# Patient Record
Sex: Male | Born: 1965 | Race: White | Hispanic: No | Marital: Married | Smoking: Former smoker
Health system: Southern US, Community
[De-identification: ages and names within clinical notes are randomized; demographics above are authoritative.]

## PROBLEM LIST (undated history)

## (undated) DIAGNOSIS — J45990 Exercise induced bronchospasm: Secondary | ICD-10-CM

## (undated) DIAGNOSIS — Z87442 Personal history of urinary calculi: Secondary | ICD-10-CM

## (undated) DIAGNOSIS — K589 Irritable bowel syndrome without diarrhea: Secondary | ICD-10-CM

## (undated) DIAGNOSIS — K219 Gastro-esophageal reflux disease without esophagitis: Secondary | ICD-10-CM

## (undated) DIAGNOSIS — N200 Calculus of kidney: Secondary | ICD-10-CM

## (undated) DIAGNOSIS — E785 Hyperlipidemia, unspecified: Secondary | ICD-10-CM

## (undated) HISTORY — DX: Hyperlipidemia, unspecified: E78.5

## (undated) HISTORY — DX: Gastro-esophageal reflux disease without esophagitis: K21.9

## (undated) HISTORY — PX: ROTATOR CUFF REPAIR: SHX139

## (undated) HISTORY — PX: FOOT SURGERY: SHX648

## (undated) HISTORY — DX: Irritable bowel syndrome, unspecified: K58.9

## (undated) HISTORY — DX: Gilbert syndrome: E80.4

## (undated) HISTORY — DX: Exercise induced bronchospasm: J45.990

## (undated) HISTORY — DX: Calculus of kidney: N20.0

---

## 2001-07-11 LAB — HM COLONOSCOPY

## 2004-04-05 ENCOUNTER — Encounter: Payer: Self-pay | Admitting: Internal Medicine

## 2004-04-05 LAB — CONVERTED CEMR LAB
ALT: 18 units/L (ref 0–40)
AST: 24 units/L (ref 0–37)
Albumin: 4.2 g/dL (ref 3.5–5.2)
Alkaline Phosphatase: 78 units/L (ref 39–117)
BUN: 9 mg/dL (ref 6–23)
CO2: 26 meq/L (ref 19–32)
Calcium: 9.5 mg/dL (ref 8.4–10.5)
Chloride: 98 meq/L (ref 96–112)
Creatinine, Ser: 1.3 mg/dL (ref 0.5–1.7)
Glucose, Bld: 88 mg/dL (ref 70–99)
HCT: 46.4 % (ref 39.0–52.0)
Hemoglobin: 15.8 g/dL (ref 13.0–17.0)
Leukocytes, UA: NEGATIVE
MCHC: 34.1 g/dL (ref 30.0–36.0)
MCV: 86.2 fL (ref 78.0–100.0)
Mono Screen: NEGATIVE
Nitrite: NEGATIVE
Platelets: 147 10*3/uL — ABNORMAL LOW (ref 150–400)
Potassium: 4.2 meq/L (ref 3.5–5.5)
RBC: 5.38 M/uL (ref 4.22–5.81)
RDW: 11.5 % (ref 11.5–14.6)
Sodium: 135 meq/L (ref 135–145)
TSH: 0.46 microintl units/mL (ref 0.35–5.50)
Total Bilirubin: 3.7 mg/dL — ABNORMAL HIGH (ref 0.3–1.2)
Total Protein, Urine: 30 mg/dL — AB
Total Protein: 7 g/dL (ref 6.0–8.3)
Urobilinogen, UA: 1 (ref 0.0–1.0)
WBC: 8.3 10*3/uL (ref 4.5–10.5)

## 2004-11-24 ENCOUNTER — Ambulatory Visit: Payer: Self-pay | Admitting: Internal Medicine

## 2004-11-28 ENCOUNTER — Ambulatory Visit: Payer: Self-pay | Admitting: Internal Medicine

## 2004-12-05 ENCOUNTER — Ambulatory Visit: Payer: Self-pay | Admitting: Internal Medicine

## 2006-04-23 ENCOUNTER — Ambulatory Visit: Payer: Self-pay | Admitting: Internal Medicine

## 2006-04-23 LAB — CONVERTED CEMR LAB
ALT: 22 units/L (ref 0–40)
AST: 27 units/L (ref 0–37)
Albumin: 3.7 g/dL (ref 3.5–5.2)
Alkaline Phosphatase: 54 units/L (ref 39–117)
BUN: 9 mg/dL (ref 6–23)
Basophils Absolute: 0 10*3/uL (ref 0.0–0.1)
Basophils Relative: 1.6 % — ABNORMAL HIGH (ref 0.0–1.0)
Bilirubin Urine: NEGATIVE
Bilirubin, Direct: 0.2 mg/dL (ref 0.0–0.3)
CO2: 28 meq/L (ref 19–32)
Calcium: 9.2 mg/dL (ref 8.4–10.5)
Chloride: 108 meq/L (ref 96–112)
Cholesterol: 266 mg/dL (ref 0–200)
Creatinine, Ser: 1 mg/dL (ref 0.4–1.5)
Direct LDL: 181.2 mg/dL
Eosinophils Absolute: 0.3 10*3/uL (ref 0.0–0.6)
Eosinophils Relative: 11.6 % — ABNORMAL HIGH (ref 0.0–5.0)
GFR calc Af Amer: 105 mL/min
GFR calc non Af Amer: 87 mL/min
Glucose, Bld: 91 mg/dL (ref 70–99)
HCT: 42.2 % (ref 39.0–52.0)
HDL: 45 mg/dL (ref >39.0)
Hemoglobin, Urine: NEGATIVE
Hemoglobin: 14.4 g/dL (ref 13.0–17.0)
Ketones, ur: NEGATIVE mg/dL
Leukocytes, UA: NEGATIVE
Lymphocytes Relative: 41.1 % (ref 12.0–46.0)
MCHC: 34.1 g/dL (ref 30.0–36.0)
MCV: 87.8 fL (ref 78.0–100.0)
Monocytes Absolute: 0.3 10*3/uL (ref 0.2–0.7)
Monocytes Relative: 11.8 % — ABNORMAL HIGH (ref 3.0–11.0)
Neutro Abs: 1 10*3/uL — ABNORMAL LOW (ref 1.4–7.7)
Neutrophils Relative %: 33.9 % — ABNORMAL LOW (ref 43.0–77.0)
Nitrite: NEGATIVE
Platelets: 163 10*3/uL (ref 150–400)
Potassium: 4 meq/L (ref 3.5–5.1)
RBC: 4.8 M/uL (ref 4.22–5.81)
RDW: 11.8 % (ref 11.5–14.6)
Sodium: 142 meq/L (ref 135–145)
Specific Gravity, Urine: 1.025 (ref 1.000–1.03)
TSH: 3.12 microintl units/mL (ref 0.35–5.50)
Total Bilirubin: 2.6 mg/dL — ABNORMAL HIGH (ref 0.3–1.2)
Total CHOL/HDL Ratio: 5.9
Total Protein, Urine: NEGATIVE mg/dL
Total Protein: 6.3 g/dL (ref 6.0–8.3)
Triglycerides: 141 mg/dL (ref 0–149)
Urine Glucose: NEGATIVE mg/dL
Urobilinogen, UA: 0.2 (ref 0.0–1.0)
VLDL: 28 mg/dL (ref 0–40)
WBC: 2.9 10*3/uL — ABNORMAL LOW (ref 4.5–10.5)
pH: 6 (ref 5.0–8.0)

## 2006-04-27 ENCOUNTER — Ambulatory Visit: Payer: Self-pay | Admitting: Internal Medicine

## 2006-07-31 ENCOUNTER — Ambulatory Visit: Payer: Self-pay | Admitting: Internal Medicine

## 2006-08-15 ENCOUNTER — Ambulatory Visit (HOSPITAL_COMMUNITY): Admission: RE | Admit: 2006-08-15 | Discharge: 2006-08-15 | Payer: Self-pay | Admitting: Orthopedic Surgery

## 2006-08-28 ENCOUNTER — Encounter: Payer: Self-pay | Admitting: Internal Medicine

## 2006-10-01 DIAGNOSIS — I10 Essential (primary) hypertension: Secondary | ICD-10-CM | POA: Insufficient documentation

## 2006-10-01 DIAGNOSIS — R945 Abnormal results of liver function studies: Secondary | ICD-10-CM | POA: Insufficient documentation

## 2007-04-26 ENCOUNTER — Telehealth: Payer: Self-pay | Admitting: Internal Medicine

## 2007-04-30 ENCOUNTER — Telehealth (INDEPENDENT_AMBULATORY_CARE_PROVIDER_SITE_OTHER): Payer: Self-pay | Admitting: *Deleted

## 2007-05-03 ENCOUNTER — Ambulatory Visit: Payer: Self-pay | Admitting: Internal Medicine

## 2007-05-03 DIAGNOSIS — K649 Unspecified hemorrhoids: Secondary | ICD-10-CM | POA: Insufficient documentation

## 2007-05-03 DIAGNOSIS — E785 Hyperlipidemia, unspecified: Secondary | ICD-10-CM | POA: Insufficient documentation

## 2007-07-25 ENCOUNTER — Ambulatory Visit: Payer: Self-pay | Admitting: Internal Medicine

## 2007-07-25 DIAGNOSIS — K219 Gastro-esophageal reflux disease without esophagitis: Secondary | ICD-10-CM | POA: Insufficient documentation

## 2007-07-25 DIAGNOSIS — J209 Acute bronchitis, unspecified: Secondary | ICD-10-CM | POA: Insufficient documentation

## 2007-07-25 DIAGNOSIS — K589 Irritable bowel syndrome without diarrhea: Secondary | ICD-10-CM | POA: Insufficient documentation

## 2007-07-25 DIAGNOSIS — J309 Allergic rhinitis, unspecified: Secondary | ICD-10-CM | POA: Insufficient documentation

## 2007-12-10 ENCOUNTER — Encounter: Payer: Self-pay | Admitting: Internal Medicine

## 2008-03-12 ENCOUNTER — Ambulatory Visit: Payer: Self-pay | Admitting: Internal Medicine

## 2008-03-12 DIAGNOSIS — J019 Acute sinusitis, unspecified: Secondary | ICD-10-CM | POA: Insufficient documentation

## 2008-03-12 DIAGNOSIS — J45909 Unspecified asthma, uncomplicated: Secondary | ICD-10-CM | POA: Insufficient documentation

## 2008-04-09 ENCOUNTER — Telehealth: Payer: Self-pay | Admitting: Internal Medicine

## 2008-05-08 ENCOUNTER — Telehealth: Payer: Self-pay | Admitting: Internal Medicine

## 2008-05-08 ENCOUNTER — Ambulatory Visit: Payer: Self-pay | Admitting: Internal Medicine

## 2008-05-08 ENCOUNTER — Encounter: Payer: Self-pay | Admitting: Internal Medicine

## 2008-05-08 DIAGNOSIS — M5412 Radiculopathy, cervical region: Secondary | ICD-10-CM | POA: Insufficient documentation

## 2008-05-08 DIAGNOSIS — R079 Chest pain, unspecified: Secondary | ICD-10-CM | POA: Insufficient documentation

## 2008-05-12 ENCOUNTER — Telehealth: Payer: Self-pay | Admitting: Internal Medicine

## 2008-05-15 ENCOUNTER — Encounter: Admission: RE | Admit: 2008-05-15 | Discharge: 2008-05-15 | Payer: Self-pay | Admitting: Internal Medicine

## 2008-05-18 ENCOUNTER — Encounter: Payer: Self-pay | Admitting: Internal Medicine

## 2008-05-25 ENCOUNTER — Ambulatory Visit: Payer: Self-pay | Admitting: Internal Medicine

## 2008-05-28 ENCOUNTER — Encounter (INDEPENDENT_AMBULATORY_CARE_PROVIDER_SITE_OTHER): Payer: Self-pay | Admitting: *Deleted

## 2008-06-01 ENCOUNTER — Telehealth: Payer: Self-pay | Admitting: Internal Medicine

## 2009-01-04 ENCOUNTER — Telehealth (INDEPENDENT_AMBULATORY_CARE_PROVIDER_SITE_OTHER): Payer: Self-pay | Admitting: *Deleted

## 2009-01-05 ENCOUNTER — Ambulatory Visit: Payer: Self-pay | Admitting: Internal Medicine

## 2009-01-05 LAB — CONVERTED CEMR LAB
ALT: 30 units/L (ref 0–53)
AST: 27 units/L (ref 0–37)
Albumin: 4.1 g/dL (ref 3.5–5.2)
Alkaline Phosphatase: 50 units/L (ref 39–117)
BUN: 14 mg/dL (ref 6–23)
Basophils Absolute: 0 10*3/uL (ref 0.0–0.1)
Basophils Relative: 0.6 % (ref 0.0–3.0)
Bilirubin Urine: NEGATIVE
Bilirubin, Direct: 0.2 mg/dL (ref 0.0–0.3)
CO2: 30 meq/L (ref 19–32)
Calcium: 9.2 mg/dL (ref 8.4–10.5)
Chloride: 105 meq/L (ref 96–112)
Cholesterol: 207 mg/dL — ABNORMAL HIGH (ref 0–200)
Creatinine, Ser: 1.1 mg/dL (ref 0.4–1.5)
Direct LDL: 130.2 mg/dL
Eosinophils Absolute: 0.5 10*3/uL (ref 0.0–0.7)
Eosinophils Relative: 11.3 % — ABNORMAL HIGH (ref 0.0–5.0)
GFR calc non Af Amer: 76.83 mL/min (ref >60)
Glucose, Bld: 89 mg/dL (ref 70–99)
HCT: 44.6 % (ref 39.0–52.0)
HDL: 39.6 mg/dL (ref >39.00)
Hemoglobin, Urine: NEGATIVE
Hemoglobin: 15.3 g/dL (ref 13.0–17.0)
Ketones, ur: NEGATIVE mg/dL
Leukocytes, UA: NEGATIVE
Lymphocytes Relative: 35 % (ref 12.0–46.0)
Lymphs Abs: 1.6 10*3/uL (ref 0.7–4.0)
MCHC: 34.2 g/dL (ref 30.0–36.0)
MCV: 90.7 fL (ref 78.0–100.0)
Monocytes Absolute: 0.5 10*3/uL (ref 0.1–1.0)
Monocytes Relative: 11.3 % (ref 3.0–12.0)
Neutro Abs: 1.9 10*3/uL (ref 1.4–7.7)
Neutrophils Relative %: 41.8 % — ABNORMAL LOW (ref 43.0–77.0)
Nitrite: NEGATIVE
PSA: 0.53 ng/mL (ref 0.10–4.00)
Platelets: 173 10*3/uL (ref 150.0–400.0)
Potassium: 4 meq/L (ref 3.5–5.1)
RBC: 4.92 M/uL (ref 4.22–5.81)
RDW: 11.2 % — ABNORMAL LOW (ref 11.5–14.6)
Sodium: 143 meq/L (ref 135–145)
Specific Gravity, Urine: >=1.03 (ref 1.000–1.030)
TSH: 3.33 microintl units/mL (ref 0.35–5.50)
Total Bilirubin: 3 mg/dL — ABNORMAL HIGH (ref 0.3–1.2)
Total CHOL/HDL Ratio: 5
Total Protein, Urine: NEGATIVE mg/dL
Total Protein: 6.6 g/dL (ref 6.0–8.3)
Triglycerides: 321 mg/dL — ABNORMAL HIGH (ref 0.0–149.0)
Urine Glucose: NEGATIVE mg/dL
Urobilinogen, UA: 0.2 (ref 0.0–1.0)
VLDL: 64.2 mg/dL — ABNORMAL HIGH (ref 0.0–40.0)
WBC: 4.5 10*3/uL (ref 4.5–10.5)
pH: 5.5 (ref 5.0–8.0)

## 2009-01-08 ENCOUNTER — Ambulatory Visit: Payer: Self-pay | Admitting: Internal Medicine

## 2009-01-08 DIAGNOSIS — Z87891 Personal history of nicotine dependence: Secondary | ICD-10-CM | POA: Insufficient documentation

## 2009-01-08 DIAGNOSIS — R39198 Other difficulties with micturition: Secondary | ICD-10-CM | POA: Insufficient documentation

## 2009-04-13 ENCOUNTER — Ambulatory Visit: Payer: Self-pay | Admitting: Internal Medicine

## 2009-05-06 ENCOUNTER — Encounter: Payer: Self-pay | Admitting: Internal Medicine

## 2009-07-14 ENCOUNTER — Encounter: Payer: Self-pay | Admitting: Internal Medicine

## 2009-11-11 ENCOUNTER — Ambulatory Visit: Payer: Self-pay | Admitting: Internal Medicine

## 2010-03-24 NOTE — Progress Notes (Signed)
  Phone Note Call from Patient   Summary of Call: Pt's wife called, pt c/o left neck, shoulder & chest pain off and on for about 1 & 1/2 mths. Pain has been persistent since 5 am. No sob, sweats or other complaints. Pt did exercise yesterday with no pain. Transfered back to schedulers for apt today Initial call taken by: Lamar Sprinkles,  May 08, 2008 8:31 AM

## 2010-03-24 NOTE — Letter (Signed)
Summary: Results Follow-up Letter  [facility]  [address] [place], [place] [address]   Phone: [phone]  Fax: [phone]    05/18/2008  [address] [place], [place]  [address]  Dear Mr. KNICK,   The following are the results of your recent test(s):  Test     Result     MRI of spine   Bulging discs and pressure on the nerves  I have referred you to a pain specialist  _________________________________________________________  Please call for an appointment as directed _________________________________________________________ _________________________________________________________ _________________________________________________________  Sincerely,  Sanda Linger MD [facility]

## 2010-03-24 NOTE — Assessment & Plan Note (Signed)
Summary: CPX/$50/cd  $50-PER WIFE RS TO 1ST WK OF AUG-STC   Vital Signs:  Patient profile:   45 year old male Weight:      157 pounds Temp:     98.6 degrees F oral Pulse rate:   80 / minute BP sitting:   108 / 80  (left arm)  Vitals Entered By: Tora Perches (January 08, 2009 3:22 PM) CC: cpx Is Patient Diabetic? No   CC:  cpx.  History of Present Illness: The patient presents for a wellness examination C/o hard to start urinating - weak stream  Current Medications (verified): 1)  Crestor 5 Mg  Tabs (Rosuvastatin Calcium) .... Every Other Day 2)  Allegra 180 Mg  Tabs (Fexofenadine Hcl) .... As Needed 3)  Triamcinolone Acetonide 0.1 %  Oint (Triamcinolone Acetonide) .... Use Tid 4)  Vitamin D3 1000 Unit  Tabs (Cholecalciferol) .Marland Kitchen.. 1 By Mouth Daily 5)  Proair Hfa 108 (90 Base) Mcg/act  Aers (Albuterol Sulfate) .... 2 Puffs Qid As Needed 6)  Naproxen 375 Mg Tabs (Naproxen) .... One By Mouth Two Times A Day As Needed For Pain 7)  Tramadol Hcl 50 Mg Tabs (Tramadol Hcl) .Marland Kitchen.. 1-2 By Mouth As Needed For Pain 8)  Valium 5 Mg Tabs (Diazepam) .... 1/2 - 1 By Mouth Before Xray 9)  Prednisone 10 Mg  Tabs (Prednisone) .... Take 40mg  Qd For 3 Days, Then 20 Mg Qd For 3 Days, Then 10mg  Qd For 6 Days, Then Stop. Take Pc.  Allergies: 1)  ! Percocet 2)  ! 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 Former Smoker long time ago Regular exercise-yes 2 children  Past Medical History: Hyperlipidemia GERD Nephrolithiasis, hx of IBS Allergic rhinitis Asthma - excercise induced Gilbert's  Review of Systems  The patient denies anorexia, fever, weight loss, weight gain, vision loss, decreased hearing, hoarseness, chest pain, syncope, dyspnea on exertion, peripheral edema,  prolonged cough, headaches, hemoptysis, abdominal pain, melena, hematochezia, severe indigestion/heartburn, hematuria, incontinence, genital sores, muscle weakness, suspicious skin lesions, transient blindness, difficulty walking, depression, unusual weight change, abnormal bleeding, enlarged lymph nodes, angioedema, and testicular masses.    Physical Exam  General:  Well-developed,well-nourished,in no acute distress; alert,appropriate and cooperative throughout examination Head:  Normocephalic and atraumatic without obvious abnormalities. No apparent alopecia or balding. Eyes:  No corneal or conjunctival inflammation noted. EOMI. Perrla. Funduscopic exam benign, without hemorrhages, exudates or papilledema. Vision grossly normal. Ears:  External ear exam shows no significant lesions or deformities.  Otoscopic examination reveals clear canals, tympanic membranes are intact bilaterally without bulging, retraction, inflammation or discharge. Hearing is grossly normal bilaterally. Nose:  External nasal examination shows no deformity or inflammation. Nasal mucosa are pink and moist without lesions or exudates. Mouth:  Oral mucosa and oropharynx without lesions or exudates.  Teeth in good repair. Neck:  No deformities, masses, or tenderness noted. Lungs:  Normal respiratory effort, chest expands symmetrically. Lungs are clear to auscultation, no crackles or wheezes. Heart:  Normal rate and regular rhythm. S1 and S2 normal without gallop, murmur, click, rub or other extra sounds. Abdomen:  Bowel sounds positive,abdomen soft and non-tender without masses, organomegaly or hernias noted. Rectal:  hard stool Genitalia:  Testes bilaterally descended without nodularity, tenderness or masses. No scrotal masses or lesions. No penis lesions or urethral discharge. Prostate:  1+  enlarged.   Msk:  No deformity or scoliosis noted of thoracic or lumbar spine.   Extremities:  No clubbing, cyanosis, edema, or deformity  noted with normal full range of motion of all joints.   Neurologic:  No cranial nerve deficits noted. Station and gait are normal. Plantar reflexes are down-going bilaterally. DTRs are symmetrical throughout. Sensory, motor and coordinative functions appear intact. Skin:  Intact without suspicious lesions or rashes Inguinal Nodes:  No significant adenopathy Psych:  Cognition and judgment appear intact. Alert and cooperative with normal attention span and concentration. No apparent delusions, illusions, hallucinations   Impression & Recommendations:  Problem # 1:  PHYSICAL EXAMINATION (ICD-V70.0) Assessment Comment Only  Health and age related issues were discussed. Available screening tests and vaccinations were discussed as well. Healthy life style including good diet and execise was discussed. The labs were reviewed with the patient.  Refused shots  Problem # 2:  SLOWING OF URINARY STREAM (ICD-788.62) poss BPH Assessment: New Empiric ABX/NSAIDs Keep return office visit - may try other meds, Urol consult  Problem # 3:  ALLERGIC RHINITIS (ICD-477.9)  His updated medication list for this problem includes:    Allegra 180 Mg Tabs (Fexofenadine hcl) .Marland Kitchen... As needed See "Patient Instructions".   Problem # 4:  CERVICAL RADICULOPATHY (ICD-723.4) Assessment: Improved  Complete Medication List: 1)  Crestor 5 Mg Tabs (Rosuvastatin calcium) .Marland Kitchen.. 1 every  day 2)  Allegra 180 Mg Tabs (Fexofenadine hcl) .... As needed 3)  Triamcinolone Acetonide 0.1 % Oint (Triamcinolone acetonide) .... Use tid 4)  Vitamin D3 1000 Unit Tabs (Cholecalciferol) .Marland Kitchen.. 1 by mouth daily 5)  Proair Hfa 108 (90 Base) Mcg/act Aers (Albuterol sulfate) .... 2 puffs qid as needed 6)  Naproxen 375 Mg Tabs (Naproxen) .... One by mouth two times a day as needed for pain 7)  Tramadol Hcl 50 Mg Tabs (Tramadol hcl) .Marland Kitchen.. 1-2 by mouth as needed for pain 8)  Valium 5 Mg Tabs (Diazepam) .... 1/2 - 1 by mouth before xray 9)  Cipro  500 Mg Tabs (Ciprofloxacin hcl) .Marland Kitchen.. 1 by mouth 2 times daily 10)  Rapaflo 8 Mg Caps (Silodosin) .Marland Kitchen.. 1 by mouth once daily as needed urinary issues 11)  Vitamin D3 1000 Unit Tabs (Cholecalciferol) .Marland Kitchen.. 1 by mouth daily  Patient Instructions: 1)  Sinus rinse 2)  Please schedule a follow-up appointment in 2 months. 3)  Try to eat more raw plant food, fresh and dry fruit, raw almonds, leafy vegetables, whole foods and less red meat, less animal fat. Poultry and fish is better for you than pork and beef. Avoid processed foods (canned soups, hot dogs, sausage, bacon , frozen dinners). Avoid corn syrup, high fructose syrup or aspartam and Splenda  containing drinks. Honey, Agave and Stevia are better sweeteners. Make your own  dressing with olive oil, wine vinegar, lemon juce, garlic etc. for your salads. Prescriptions: NAPROXEN 375 MG TABS (NAPROXEN) one by mouth two times a day as needed for pain  #60 x 1   Entered and Authorized by:   Tresa Garter MD   Signed by:   Tresa Garter MD on 01/08/2009   Method used:   Print then Give to Patient   RxID:   [id number] RAPAFLO 8 MG CAPS (SILODOSIN) 1 by mouth once daily as needed urinary issues  #30 x 6   Entered and Authorized by:   Tresa Garter MD   Signed by:   Tresa Garter MD on 01/08/2009  Method used:   Print then Give to Patient   RxID:   [id number] CIPRO 500 MG TABS (CIPROFLOXACIN HCL) 1 by mouth 2 times daily  #28 x 0   Entered and Authorized by:   Tresa Garter MD   Signed by:   Tresa Garter MD on 01/08/2009   Method used:   Print then Give to Patient   RxID:   [id number] CRESTOR 5 MG  TABS (ROSUVASTATIN CALCIUM) 1 every  day  #90 x 3   Entered and Authorized by:   Tresa Garter MD   Signed by:   Tresa Garter MD on 01/08/2009   Method used:   Print then Give to Patient   RxID:   [id number]

## 2010-03-24 NOTE — Progress Notes (Signed)
Summary: Medication request  Phone Note Call from Patient Call back at [phone]   Caller: Spouse Summary of Call: Pt spouse called requesting a sedative for pt to have before MRI on Friday. Initial call taken by: Zackery Barefoot CMA,  May 12, 2008 11:28 AM  Follow-up for Phone Call        see Rx order Follow-up by: Etta Grandchild MD,  May 13, 2008 7:21 AM  Additional Follow-up for Phone Call Additional follow up Details #1::        Pt's wife informed, rx called into pharm Additional Follow-up by: Lamar Sprinkles,  May 13, 2008 8:53 AM    New/Updated Medications: VALIUM 5 MG TABS (DIAZEPAM) 1/2 - 1 by mouth before Xray   Prescriptions: VALIUM 5 MG TABS (DIAZEPAM) 1/2 - 1 by mouth before Xray  #10 x 0   Entered and Authorized by:   Etta Grandchild MD   Signed by:   Etta Grandchild MD on 05/13/2008   Method used:   Historical   RxID:   [id number]

## 2010-03-24 NOTE — Assessment & Plan Note (Signed)
Summary: 2 mos f/u #/cd   Vital Signs:  Patient profile:   45 year old male Height:      65 inches Weight:      161 pounds BMI:     26.89 Temp:     98.4 degrees F oral Pulse rate:   61 / minute BP sitting:   106 / 74  (left arm)  Vitals Entered By: Tora Perches (April 13, 2009 3:32 PM) CC: f/u Is Patient Diabetic? No   CC:  f/u.  History of Present Illness: F/u hyperlipidemia, urination issue 25 % better after Cipro  Preventive Screening-Counseling & Management  Alcohol-Tobacco     Smoking Status: quit  Current Medications (verified): 1)  Crestor 5 Mg  Tabs (Rosuvastatin Calcium) .Marland Kitchen.. 1 Every  Day 2)  Allegra 180 Mg  Tabs (Fexofenadine Hcl) .... As Needed 3)  Triamcinolone Acetonide 0.1 %  Oint (Triamcinolone Acetonide) .... Use Tid 4)  Vitamin D3 1000 Unit  Tabs (Cholecalciferol) .Marland Kitchen.. 1 By Mouth Daily 5)  Proair Hfa 108 (90 Base) Mcg/act  Aers (Albuterol Sulfate) .... 2 Puffs Qid As Needed 6)  Naproxen 375 Mg Tabs (Naproxen) .... One By Mouth Two Times A Day As Needed For Pain 7)  Tramadol Hcl 50 Mg Tabs (Tramadol Hcl) .Marland Kitchen.. 1-2 By Mouth As Needed For Pain 8)  Valium 5 Mg Tabs (Diazepam) .... 1/2 - 1 By Mouth Before Xray 9)  Rapaflo 8 Mg Caps (Silodosin) .Marland Kitchen.. 1 By Mouth Once Daily As Needed Urinary Issues 10)  Vitamin D3 1000 Unit  Tabs (Cholecalciferol) .Marland Kitchen.. 1 By Mouth Daily  Allergies: 1)  ! Percocet 2)  ! 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    Physical Exam  General:  Well-developed,well-nourished,in no acute distress; alert,appropriate and cooperative throughout examination Mouth:  Oral mucosa and oropharynx without  lesions or exudates.  Teeth in good repair. Lungs:  Normal respiratory effort, chest expands symmetrically. Lungs are clear to auscultation, no crackles or wheezes. Heart:  Normal rate and regular rhythm. S1 and S2 normal without gallop, murmur, click, rub or other extra sounds. Abdomen:  Bowel sounds positive,abdomen soft and non-tender without masses, organomegaly or hernias noted.   Impression & Recommendations:  Problem # 1:  SLOWING OF URINARY STREAM (ICD-788.62) Assessment Improved 25% better after Cipro Orders: Urology Referral (Urology) Dr Annabell Howells  Problem # 2:  HYPERLIPIDEMIA (ICD-272.4) Assessment: Improved  His updated medication list for this problem includes:    Crestor 5 Mg Tabs (Rosuvastatin calcium) .Marland Kitchen... 1 every  day  Problem # 3:  GERD (ICD-530.81) Assessment: Improved  Complete Medication List: 1)  Crestor 5 Mg Tabs (Rosuvastatin calcium) .Marland Kitchen.. 1 every  day 2)  Allegra 180 Mg Tabs (Fexofenadine hcl) .... As needed 3)  Triamcinolone Acetonide 0.1 % Oint (Triamcinolone acetonide) .... Use tid 4)  Vitamin D3 1000 Unit Tabs (Cholecalciferol) .Marland Kitchen.. 1 by mouth daily 5)  Proair Hfa 108 (90 Base) Mcg/act Aers (Albuterol sulfate) .... 2 puffs qid as needed 6)  Naproxen 375 Mg Tabs (Naproxen) .... One by mouth two times a day as needed for pain 7)  Tramadol Hcl 50 Mg Tabs (Tramadol hcl) .Marland Kitchen.. 1-2 by mouth as needed for pain 8)  Valium 5 Mg Tabs (Diazepam) .Marland KitchenMarland KitchenMarland Kitchen  1/2 - 1 by mouth before xray 9)  Rapaflo 8 Mg Caps (Silodosin) .Marland Kitchen.. 1 by mouth once daily as needed urinary issues 10)  Vitamin D3 1000 Unit Tabs (Cholecalciferol) .Marland Kitchen.. 1 by mouth daily  Patient Instructions: 1)  Please schedule a follow-up appointment in 1 year well w/labs

## 2010-03-24 NOTE — Progress Notes (Signed)
Summary: Med request  Phone Note Call from Patient   Summary of Call: pts spouse called stating that [facility] sent a refill request for 3 months of Crestor and it was denied. Pts spouse would like a call back to why it was denied at [phone]. Initial call taken by: Josph Macho CMA,  January 04, 2009 4:51 PM  Follow-up for Phone Call        called pt-----lmom that med has been refill three times:  #45 with 3 refills on 07-24-08, and #90 with 3 refills on 07-24-08,  and #30 with 12 refills on 07-23-08./vg Follow-up by: Tora Perches,  January 05, 2009 2:48 PM

## 2010-03-24 NOTE — Letter (Signed)
Summary: Results Follow-up Letter  [facility]  [address] [place], [place] [address]   Phone: [phone]  Fax: [phone]    05/08/2008  [address] [place], [place]  [address]  Dear Kyle Griffin,   The following are the results of your recent test(s):  Test     Result     C-spine Xray   arthritis at C6-C7 ___________________________________please get the MRI done______________________  Please call for an appointment as directed _________________________________________________________ _________________________________________________________ _________________________________________________________  Sincerely,  Sanda Linger MD [facility]

## 2010-03-24 NOTE — Assessment & Plan Note (Signed)
Summary: DR AVP PT-NECK PAIN-L SHOULDER PAIN-L CHEST PAIN-$50 PER SARA...   Vital Signs:  Patient profile:   45 year old male Height:      70 inches Weight:      159 pounds BMI:     22.90 Temp:     98.7 degrees F oral Pulse rate:   56 / minute Pulse rhythm:   regular BP sitting:   110 / 70  (right arm) Cuff size:   large  Vitals Entered By: Rock Nephew CMA (May 08, 2008 9:49 AM) Pain Assessment Patient in pain? yes     Location: Neck, shoulder Onset of pain  occ x 1 1/2 mo   History of Present Illness: this is a new pt. to me who complains of 6 week hx. of worsening neck pain that radaities into left trapezius, arm, and anterior chest wall. He has no hx. of trauma or injury. The pain is not exertional but worsens with movement in the neck. The pain location is posterior over the spine.  Current Medications (verified): 1)  Crestor 5 Mg  Tabs (Rosuvastatin Calcium) .... Every Other Day 2)  Allegra 180 Mg  Tabs (Fexofenadine Hcl) .... As Needed 3)  Triamcinolone Acetonide 0.1 %  Oint (Triamcinolone Acetonide) .... Use Tid 4)  Vitamin D3 1000 Unit  Tabs (Cholecalciferol) .Marland Kitchen.. 1 By Mouth Daily 5)  Proair Hfa 108 (90 Base) Mcg/act  Aers (Albuterol Sulfate) .... 2 Puffs Qid As Needed  Allergies (verified): 1)  ! Percocet 2)  ! Lipitor  Past History:  Family History:    M CAD (05/03/2007)  Risk Factors:    Alcohol Use: N/A    >5 drinks/d w/in last 3 months: N/A    Caffeine Use: N/A    Diet: N/A    Exercise: yes (05/03/2007)  Past medical, surgical, family and social histories (including risk factors) reviewed, and no changes noted (except as noted below).  Past Medical History:    Reviewed history from 03/12/2008 and no changes required:    Hyperlipidemia    GERD    Nephrolithiasis, hx of    IBS    Allergic rhinitis    Asthma - excercise induced  Past Surgical History:    Reviewed history from 07/25/2007 and no changes required:    Rotator cuff repair  s/p right heel surgury - cyst  Family History:    Reviewed history from 05/03/2007 and no changes required:       M CAD  Social History:    Reviewed history from 07/25/2007 and no changes required:       Occupation:manager [employer]       Married       Former Smoker long time ago       Regular exercise-yes       2 children  Review of Systems  The patient denies anorexia, fever, hoarseness, syncope, dyspnea on exertion, peripheral edema, prolonged cough, headaches, hemoptysis, abdominal pain, hematuria, suspicious skin lesions, and enlarged lymph nodes.   CV:  Denies bluish discoloration of lips or nails, difficulty breathing at night, fainting, fatigue, leg cramps with exertion, lightheadness, near fainting, palpitations, shortness of breath with exertion, swelling of feet, swelling of hands, and weight gain.  Physical Exam  General:  alert, well-developed, well-nourished, and well-hydrated.   Head:  Normocephalic and atraumatic without obvious abnormalities. No apparent alopecia or balding. Mouth:  Oral mucosa and oropharynx without lesions or exudates.  Teeth in good repair. Neck:  supple, full ROM, no masses,  and no thyromegaly.   Lungs:  Normal respiratory effort, chest expands symmetrically. Lungs are clear to auscultation, no crackles or wheezes. Heart:  Normal rate and regular rhythm. S1 and S2 normal without gallop, murmur, click, rub or other extra sounds. Abdomen:  Bowel sounds positive,abdomen soft and non-tender without masses, organomegaly or hernias noted. Msk:  normal ROM, no joint tenderness, no joint swelling, no joint warmth, no redness over joints, and no joint deformities.   Pulses:  R and L carotid,radial,femoral,dorsalis pedis and posterior tibial pulses are full and equal bilaterally Extremities:  No clubbing, cyanosis, edema, or deformity noted with normal full range of motion of all joints.   Neurologic:  Mild muscle atrophy in the left hand. alert & oriented  X3, cranial nerves II-XII intact, sensation intact to light touch, sensation intact to pinprick, gait normal, and DTRs symmetrical and normal.   Skin:  Intact without suspicious lesions or rashes Cervical Nodes:  No lymphadenopathy noted Psych:  Cognition and judgment appear intact. Alert and cooperative with normal attention span and concentration. No apparent delusions, illusions, hallucinations Additional Exam:  EKG shows sinus bradycardia with no Q waves and no ST/T wave changes.   Detailed Back/Spine Exam  General:    Well-developed, well-nourished, in no acute distress; alert and oriented x 3.    Gait:    Normal heel-toe gait pattern bilaterally.    Skin:    Intact with no erythema; no scarring.    Inspection:    plantigrade foot with normal alignment of leg, ankle, hindfoot, and foot.   Vascular:    dorsalis pedis and posterior tibial pulses 2+ and symmetric, capillary refill < 2 seconds, normal hair pattern, no evidence of ischemia.   Cervical Exam:  Inspection-deformity:    Normal Palpation-spinal tenderness:  Abnormal    Location:  C4-C5 Range of Motion:    Forward Flexion:   90 degrees    Hyperextension:   90 degrees    Right Lat. Flexion:   60 degrees    Left Lat. Flexion:   45 degrees    Right Lat. Rotation:   85 degrees    Left Lat. Rotation:   80 degrees Spurling Maneuver:    negative Hoffman's Sign:    Right:  negative    Left:  negative    Impression & Recommendations:  Problem # 1:  CHEST PAIN (ICD-786.50) Assessment New this does not sound like cardiac pain, I believe it is radiation of pain from the neck Orders: EKG w/ Interpretation (93000)  Problem # 2:  CERVICAL RADICULOPATHY (ICD-723.4) Assessment: New try tramadol Orders: T-Cervical Spine Comp w/Flex & Ext (09323FT) Radiology Referral (Radiology)  Complete Medication List: 1)  Crestor 5 Mg Tabs (Rosuvastatin calcium) .... Every other day 2)  Allegra 180 Mg Tabs (Fexofenadine hcl)  .... As needed 3)  Triamcinolone Acetonide 0.1 % Oint (Triamcinolone acetonide) .... Use tid 4)  Vitamin D3 1000 Unit Tabs (Cholecalciferol) .Marland Kitchen.. 1 by mouth daily 5)  Proair Hfa 108 (90 Base) Mcg/act Aers (Albuterol sulfate) .... 2 puffs qid as needed 6)  Naproxen 375 Mg Tabs (Naproxen) .... One by mouth two times a day as needed for pain 7)  Tramadol Hcl 50 Mg Tabs (Tramadol hcl) .Marland Kitchen.. 1-2 by mouth as needed for pain  Patient Instructions: 1)  Please schedule a follow-up appointment in 2 weeks. 2)  Most patients (90%) with low back pain will improve with time (2-6 weeks). Keep active but avoid activities that are painful. Apply moist heat and/or  ice to lower back several times a day. Prescriptions: TRAMADOL HCL 50 MG TABS (TRAMADOL HCL) 1-2 by mouth as needed for pain  #35 x 1   Entered and Authorized by:   Etta Grandchild MD   Signed by:   Etta Grandchild MD on 05/08/2008   Method used:   Electronically to        [facility] [address] [phone]* (retail)       [address]       [place], [place]  [address]       Ph: [phone] or [phone]       Fax: [phone]   RxID:   [id number] NAPROXEN 375 MG TABS (NAPROXEN) one by mouth two times a day as needed for pain  #30 x 0   Entered and Authorized by:   Etta Grandchild MD   Signed by:   Etta Grandchild MD on 05/08/2008   Method used:   Electronically to        [facility] [address] [phone]* (retail)       [address]       [place], [place]  [address]       Ph: [phone] or [phone]       Fax: [phone]   RxID:   [id number]

## 2010-03-24 NOTE — Consult Note (Signed)
Summary: [facility]  [facility]   Imported By: Lester [place] 05/19/2009 11:25:58  _____________________________________________________________________  External Attachment:    Type:   Image     Comment:   External Document

## 2010-03-24 NOTE — Assessment & Plan Note (Signed)
Summary: 2 wk f/u to [facility] appt/cd   Vital Signs:  Patient profile:   45 year old male Height:      65 inches Weight:      160 pounds Temp:     97.9 degrees F oral Pulse rate:   51 / minute BP sitting:   104 / 72  (left arm)  Vitals Entered By: Tora Perches (May 25, 2008 3:46 PM) CC: f/u Is Patient Diabetic? No   CC:  f/u.  History of Present Illness: The patient presents for a follow up of neck and chest pain on R, hyperlipidemia. Took meds - not better. No exertional sympt   Current Medications (verified): 1)  Crestor 5 Mg  Tabs (Rosuvastatin Calcium) .... Every Other Day 2)  Allegra 180 Mg  Tabs (Fexofenadine Hcl) .... As Needed 3)  Triamcinolone Acetonide 0.1 %  Oint (Triamcinolone Acetonide) .... Use Tid 4)  Vitamin D3 1000 Unit  Tabs (Cholecalciferol) .Marland Kitchen.. 1 By Mouth Daily 5)  Proair Hfa 108 (90 Base) Mcg/act  Aers (Albuterol Sulfate) .... 2 Puffs Qid As Needed 6)  Naproxen 375 Mg Tabs (Naproxen) .... One By Mouth Two Times A Day As Needed For Pain 7)  Tramadol Hcl 50 Mg Tabs (Tramadol Hcl) .Marland Kitchen.. 1-2 By Mouth As Needed For Pain 8)  Valium 5 Mg Tabs (Diazepam) .... 1/2 - 1 By Mouth Before Xray  Allergies: 1)  ! Percocet 2)  ! Lipitor   Impression & Recommendations:  Problem # 1:  CHEST PAIN (ICD-786.50) due to #2 Assessment Unchanged  Problem # 2:  CERVICAL RADICULOPATHY  - mild Assessment: Unchanged MRI discussed Trial of Prednisone ROM exercise [employer] at work  Problem # 3:  HYPERLIPIDEMIA (ICD-272.4) Assessment: Comment Only  His updated medication list for this problem includes:    Crestor 5 Mg Tabs (Rosuvastatin calcium) ..... Every other day restart later and f/u w/labs  Complete Medication List: 1)  Crestor 5 Mg Tabs (Rosuvastatin calcium) .... Every other day 2)  Allegra 180 Mg Tabs (Fexofenadine hcl) .... As needed 3)  Triamcinolone Acetonide 0.1 % Oint (Triamcinolone acetonide) .... Use tid 4)  Vitamin D3 1000 Unit Tabs  (Cholecalciferol) .Marland Kitchen.. 1 by mouth daily 5)  Proair Hfa 108 (90 Base) Mcg/act Aers (Albuterol sulfate) .... 2 puffs qid as needed 6)  Naproxen 375 Mg Tabs (Naproxen) .... One by mouth two times a day as needed for pain 7)  Tramadol Hcl 50 Mg Tabs (Tramadol hcl) .Marland Kitchen.. 1-2 by mouth as needed for pain 8)  Valium 5 Mg Tabs (Diazepam) .... 1/2 - 1 by mouth before xray 9)  Prednisone 10 Mg Tabs (Prednisone) .... Take 40mg  qd for 3 days, then 20 mg qd for 3 days, then 10mg  qd for 6 days, then stop. take pc.  Patient Instructions: 1)  Please schedule a follow-up appointment in 1 month. 2)  Contour pillow Prescriptions: PREDNISONE 10 MG  TABS (PREDNISONE) Take 40mg  qd for 3 days, then 20 mg qd for 3 days, then 10mg  qd for 6 days, then stop. Take pc.  #24 x 0   Entered and Authorized by:   Tresa Garter MD   Signed by:   Tresa Garter MD on 05/25/2008   Method used:   [facility]        [facility] [address] [phone]* (retail)       [address]       [facility],  [place]  [address]       Ph: [phone] or [phone]       Fax: [phone]   RxID:   [id number]

## 2010-03-24 NOTE — Assessment & Plan Note (Signed)
Summary: HEMORRHIOD/NWS   Vital Signs:  Patient Profile:   45 Years Old Male Weight:      153 pounds Temp:     97.6 degrees F oral Pulse rate:   50 / minute BP sitting:   107 / 69  (right arm)  Vitals Entered By: Tora Perches (May 03, 2007 1:28 PM)             Is Patient Diabetic? No     Chief Complaint:  Multiple medical problems or concerns.  History of Present Illness: C/o a  painfull and itchy lmp in the rectum x 1 wk    Current Allergies: ! PERCOCET ! LIPITOR  Past Medical History:        Hyperlipidemia   Family History:    M CAD  Social History:    Occupation:    Married    Former Smoker long time ago    Regular exercise-yes   Risk Factors:  Tobacco use:  quit Exercise:  yes    Physical Exam  General:     Well-developed,well-nourished,in no acute distress; alert,appropriate and cooperative throughout examination Rectal:     One 1x0.5 cm external hemorrhoid(s).   Prostate:     Prostate gland firm and smooth, no enlargement, nodularity, tenderness, mass, asymmetry or induration.    Impression & Recommendations:  Problem # 1:  HEMORRHOIDS, NOS (ICD-455.6) Assessment: New  Digital rectal exam revealed a soft blueish mass at 3 o'clock, a little  tender. Will treat concervatively for few more days. Stop biking x 1 wk. Surg. consult if not better.   Complete Medication List: 1)  Crestor 5 Mg Tabs (Rosuvastatin calcium) .... Every other day 2)  Allegra 180 Mg Tabs (Fexofenadine hcl) .... As needed 3)  Triamcinolone Acetonide 0.1 % Oint (Triamcinolone acetonide) .... Use tid 4)  Vitamin D3 1000 Unit Tabs (Cholecalciferol) .Marland Kitchen.. 1 by mouth daily   Patient Instructions: 1)  Please schedule a follow-up appointment in 1 week.    ]

## 2010-03-24 NOTE — Progress Notes (Signed)
Summary: hemmroids  Phone Note Call from Patient   Caller: Wife [phone] Summary of Call: Pt given rx friday for hemmroids but it has not helped. Per phone note from Friday pt needs office visit  Initial call taken by: Lamar Sprinkles,  April 30, 2007 1:22 PM  Follow-up for Phone Call        PT HAS AN APPT MARCH 13 WITH DR PLOTNIKOV. Follow-up by: Hilarie Fredrickson,  April 30, 2007 3:18 PM

## 2010-03-24 NOTE — Assessment & Plan Note (Signed)
Summary: FLU Kyle Griffin  Nurse Visit   Allergies: 1)  ! Percocet 2)  ! Lipitor  Orders Added: 1)  Flu Vaccine 12yrs + MEDICARE PATIENTS [Q2039] 2)  Administration Flu vaccine - MCR [G0008]      Flu Vaccine Consent Questions     Do you have a history of severe allergic reactions to this vaccine? no    Any prior history of allergic reactions to egg and/or gelatin? no    Do you have a sensitivity to the preservative Thimersol? no    Do you have a past history of Guillan-Barre Syndrome? no    Do you currently have an acute febrile illness? no    Have you ever had a severe reaction to latex? no    Vaccine information given and explained to patient? yes    Are you currently pregnant? no    Lot Number:[id number]   Exp Date:08/20/2010   Site Given  Left Deltoid IMu

## 2010-03-24 NOTE — Progress Notes (Signed)
Summary: congestion  Phone Note Call from Patient Call back at [phone]   Caller: beth-wife Summary of Call: Patient recently seen for sinus infection by Dr Jonny Ruiz and given antibiotic. He is requesting another round of antibiotics Initial call taken by: Rock Nephew CMA,  April 09, 2008 3:08 PM  Follow-up for Phone Call        OK to ref Augmentin. OV if not well Follow-up by: Tresa Garter MD,  April 09, 2008 10:43 PM  Additional Follow-up for Phone Call Additional follow up Details #1::        pt called this am, wondering if he will be able to get meds, please call him [phone]. Additional Follow-up by: Verdell Face,  April 10, 2008 8:08 AM    Additional Follow-up for Phone Call Additional follow up Details #2::    Rx sent in and wife notified Follow-up by: Rock Nephew CMA,  April 10, 2008 8:49 AM    Prescriptions: AUGMENTIN 875-125 MG TABS (AMOXICILLIN-POT CLAVULANATE) generic - 1 by mouth two times a day  #20 x 0   Entered by:   Rock Nephew CMA   Authorized by:   Tresa Garter MD   Signed by:   Rock Nephew CMA on 04/10/2008   Method used:   [facility]ron[facility] to        [facility] [address] [phone]* (retail)       [address]       [place], [place]  [address]       Ph: [phone] or [phone]       Fax: [phone]   RxID:   [id number]

## 2010-03-24 NOTE — Miscellaneous (Signed)
Summary: crestor  Clinical Lists Changes  Medications: Rx of CRESTOR 5 MG  TABS (ROSUVASTATIN CALCIUM) every other day;  #30 x 6;  Signed;  Entered by: Tora Perches;  Authorized by: Tresa Garter MD;  Method used: [facility] [address] [phone]*, [address] [place], [place], [place]  [address], Ph: [phone] or [phone], Fax: [phone]    Prescriptions: CRESTOR 5 MG  TABS (ROSUVASTATIN CALCIUM) every other day  #30 x 6   Entered by:   Tora Perches   Authorized by:   Tresa Garter MD   Signed by:   Tora Perches on 12/10/2007   Method used:   Electronically to        [facility] [address] [phone]* (retail)       [address]       [place], [place]  [address]       Ph: [phone] or [phone]       Fax: [phone]   RxID:   [id number]

## 2010-03-24 NOTE — Letter (Signed)
Summary: [facility] Consult Scheduled Letter  [facility]  [address] [place], [place] [address]   Phone: [phone]  Fax: [phone]      05/28/2008 MRN: [id number]  Kyle Griffin [address] [place], [place]  [address]    Dear Kyle Griffin,      We have scheduled an appointment for you.  At the recommendation of Dr. Yetta Barre, we have scheduled you a consult with Dr. Wynn Banker on Jul 13, 2008 at 9:00am.  Their phone number is [phone].  If this appointment day and time is not convenient for you, please feel free to call the office of the doctor you are being referred to at the number listed above and reschedule the appointment.  Dr. Wynn Banker [facility] [address] [place], [place] [address]  Thank you,  Patient Care Coordinator [facility]

## 2010-03-24 NOTE — Letter (Signed)
Summary: [facility]  [facility]   Imported By: Lester Loving 07/26/2009 09:52:19  _____________________________________________________________________  External Attachment:    Type:   Image     Comment:   External Document

## 2010-03-24 NOTE — Assessment & Plan Note (Signed)
Summary: CONGESTION/COUGH/NO FEVER/ $50 /NWS   Vital Signs:  Patient Profile:   45 Years Old Male Weight:      160 pounds O2 Sat:      96 % O2 treatment:    Room Air Temp:     98.9 degrees F oral Pulse rate:   62 / minute BP sitting:   132 / 74  (left arm) Cuff size:   regular  Vitals Entered By: Payton Spark CMA (March 12, 2008 1:44 PM)                 Chief Complaint:  congestion/ cough and fever.  History of Present Illness: here with acute onset facial pain , pressure, fever and greenish worsening now overall x 3 wk - no CP, wheezing, doe, sob, or LE edema; does have some wheezing with excercise at the gym on a regular basis for many months and the wife's inhaler prior seems to help well     Prior Medications Reviewed Using: Patient Recall  Updated Prior Medication List: CRESTOR 5 MG  TABS (ROSUVASTATIN CALCIUM) every other day ALLEGRA 180 MG  TABS (FEXOFENADINE HCL) as needed TRIAMCINOLONE ACETONIDE 0.1 %  OINT (TRIAMCINOLONE ACETONIDE) use tid VITAMIN D3 1000 UNIT  TABS (CHOLECALCIFEROL) 1 by mouth daily PROAIR HFA 108 (90 BASE) MCG/ACT  AERS (ALBUTEROL SULFATE) 2 puffs qid as needed AUGMENTIN 875-125 MG TABS (AMOXICILLIN-POT CLAVULANATE) generic - 1 by mouth two times a day  Current Allergies (reviewed today): ! PERCOCET ! LIPITOR  Past Medical History:    Reviewed history from 07/25/2007 and no changes required:       Hyperlipidemia       GERD       Nephrolithiasis, hx of       IBS       Allergic rhinitis       Asthma - excercise induced  Past Surgical History:    Reviewed history from 07/25/2007 and no changes required:       Rotator cuff repair       s/p right heel surgury - cyst   Social History:    Reviewed history from 07/25/2007 and no changes required:       Occupation:manager [employer]       Married       Former Smoker long time ago       Regular exercise-yes       2 children    Review of Systems       all otherwise negative      Physical Exam  General:     alert and well-developed.  , mild ill  Head:     Normocephalic and atraumatic without obvious abnormalities. No apparent alopecia or balding. Eyes:     No corneal or conjunctival inflammation noted. EOMI. Perrla. Fu Ears:     bilat tm's red, sinus tender bilat Nose:     nasal dischargemucosal pallor and mucosal erythema.   Mouth:     pharyngeal erythema and fair dentition.   Neck:     supple and cervical lymphadenopathy.   Lungs:     Normal respiratory effort, chest expands symmetrically. Lungs are clear to auscultation, no crackles or wheezes. Heart:     Normal rate and regular rhythm. S1 and S2 normal without gallop, murmur, click, rub or other extra sounds. Extremities:     no edema, no ulcers     Impression & Recommendations:  Problem # 1:  SINUSITIS- ACUTE-NOS (ICD-461.9)  The following medications were  removed from the medication list:    Azithromycin 250 Mg Tabs (Azithromycin) .Marland Kitchen... 1po once daily    Promethazine-codeine 6.25-10 Mg/30ml Syrp (Promethazine-codeine) .Marland Kitchen... 1 tsp by mouth q 6 hrs as needed cough  His updated medication list for this problem includes:    Augmentin 875-125 Mg Tabs (Amoxicillin-pot clavulanate) .Marland Kitchen... Generic - 1 by mouth two times a day treat as above, f/u any worsening signs or symptoms   Problem # 2:  ASTHMA (ICD-493.90)  The following medications were removed from the medication list:    Prednisone 10 Mg Tabs (Prednisone) .Marland Kitchen... 3po qd for 3days, then 2po qd for 3days, then 1po qd for 3days, then stop  His updated medication list for this problem includes:    Proair Hfa 108 (90 Base) Mcg/act Aers (Albuterol sulfate) .Marland Kitchen... 2 puffs qid as needed excercise related only it seems - treat as above, f/u any worsening signs or symptoms   Complete Medication List: 1)  Crestor 5 Mg Tabs (Rosuvastatin calcium) .... Every other day 2)  Allegra 180 Mg Tabs (Fexofenadine hcl) .... As needed 3)  Triamcinolone  Acetonide 0.1 % Oint (Triamcinolone acetonide) .... Use tid 4)  Vitamin D3 1000 Unit Tabs (Cholecalciferol) .Marland Kitchen.. 1 by mouth daily 5)  Proair Hfa 108 (90 Base) Mcg/act Aers (Albuterol sulfate) .... 2 puffs qid as needed 6)  Augmentin 875-125 Mg Tabs (Amoxicillin-pot clavulanate) .... Generic - 1 by mouth two times a day  Other Orders: Admin 1st Vaccine (81191) DT Vaccine (47829)   Patient Instructions: 1)  Please take all new medications as prescribed 2)  Continue all medications that you may have been taking previously 3)  Please schedule an appointment with your primary doctor in : 3 months for physical - dr Posey Rea   Prescriptions: AUGMENTIN 875-125 MG TABS (AMOXICILLIN-POT CLAVULANATE) generic - 1 by mouth two times a day  #20 x 0   Entered and Authorized by:   Corwin Levins MD   Signed by:   Corwin Levins MD on 03/12/2008   Method used:   Print then Give to Patient   RxID:   [id number] PROAIR HFA 108 (90 BASE) MCG/ACT  AERS (ALBUTEROL SULFATE) 2 puffs qid as needed  #1 x 11   Entered and Authorized by:   Corwin Levins MD   Signed by:   Corwin Levins MD on 03/12/2008   Method used:   Print then Give to Patient   RxID:   [id number]

## 2010-03-24 NOTE — Progress Notes (Signed)
Summary: hemmroids  Phone Note Call from Patient   Caller: [phone] Summary of Call: Pt c/o hemmroid problems, next avail apt is APRIL. Would you call in rx? Initial call taken by: Lamar Sprinkles,  April 26, 2007 2:22 PM  Follow-up for Phone Call        Triamc. oint Rx Metamucil 2 caps once daily OV next wk if problems Follow-up by: Tresa Garter MD,  April 26, 2007 6:04 PM    New/Updated Medications: TRIAMCINOLONE ACETONIDE 0.1 %  OINT (TRIAMCINOLONE ACETONIDE) use tid   Prescriptions: TRIAMCINOLONE ACETONIDE 0.1 %  OINT (TRIAMCINOLONE ACETONIDE) use tid  #45 x 2   Entered by:   Lamar Sprinkles   Authorized by:   Tresa Garter MD   Signed by:   Lamar Sprinkles on 04/26/2007   Method used:   Electronically sent to ...       [facility] [address] [phone]*       [address]       [place], [place]  [address]       Ph: [phone] or [phone]       Fax: [phone]   RxID:   [id number] TRIAMCINOLONE ACETONIDE 0.1 %  OINT (TRIAMCINOLONE ACETONIDE) use tid  #45g x 2   Entered and Authorized by:   Tresa Garter MD   Signed by:   Tresa Garter MD on 04/26/2007   Method used:   Print then Give to Patient   RxID:   [id number]

## 2010-03-24 NOTE — Assessment & Plan Note (Signed)
Summary: DR PLOTNIKOV PT-SINUS/CHEST CONGESTION-$50-STC   Vital Signs:  Patient Profile:   45 Years Old Male Weight:      153 pounds Temp:     97.2 degrees F oral Pulse rate:   60 / minute BP sitting:   119 / 82  (right arm) Cuff size:   regular  Pt. in pain?   no  Vitals Entered By: Maris Berger (July 25, 2007 1:56 PM)                  Preventive Care Screening  Last Tetanus Booster:    Date:  02/21/1992    Results:  given    Chief Complaint:  congestion.  History of Present Illness: here with acute onset for 3 days prod cough, with mild sob and wheezing, fever without cp but with mild fever, no chills    Updated Prior Medication List: CRESTOR 5 MG  TABS (ROSUVASTATIN CALCIUM) every other day ALLEGRA 180 MG  TABS (FEXOFENADINE HCL) as needed TRIAMCINOLONE ACETONIDE 0.1 %  OINT (TRIAMCINOLONE ACETONIDE) use tid VITAMIN D3 1000 UNIT  TABS (CHOLECALCIFEROL) 1 by mouth daily AZITHROMYCIN 250 MG  TABS (AZITHROMYCIN) 1po once daily PROAIR HFA 108 (90 BASE) MCG/ACT  AERS (ALBUTEROL SULFATE) 2 puffs qid prn PROMETHAZINE-CODEINE 6.25-10 MG/5ML  SYRP (PROMETHAZINE-CODEINE) 1 tsp by mouth q 6 hrs as needed cough PREDNISONE 10 MG  TABS (PREDNISONE) 3po qd for 3days, then 2po qd for 3days, then 1po qd for 3days, then stop  Current Allergies (reviewed today): ! PERCOCET ! LIPITOR  Past Medical History:    Reviewed history from 05/03/2007 and no changes required:       Hyperlipidemia       GERD       Nephrolithiasis, hx of       IBS       Allergic rhinitis  Past Surgical History:    Reviewed history from 08/28/2006 and no changes required:       Rotator cuff repair       s/p right heel surgury - cyst   Family History:    Reviewed history from 05/03/2007 and no changes required:       M CAD  Social History:    Reviewed history from 05/03/2007 and no changes required:       Occupation:manager [employer]       Married       Former Smoker long time  ago       Regular exercise-yes       2 children    Review of Systems       all otherwise negative    Physical Exam  General:     mild ill, well developed  Head:     Normocephalic and atraumatic without obvious abnormalities. No apparent alopecia or balding. Eyes:     No corneal or conjunctival inflammation noted. EOMI. Perrla. Ears:     bilat tm's red, sinus nontender Nose:     nasal dischargemucosal pallor and mucosal erythema.   Mouth:     pharyngeal erythema and fair dentition.   Neck:     supple and cervical lymphadenopathy.   Lungs:     Normal respiratory effort, chest expands symmetrically. Lungs are clear to auscultation, no crackles or wheezes. Heart:     Normal rate and regular rhythm. S1 and S2 normal without gallop, murmur, click, rub or other extra sounds. Extremities:     no edema, no ulcers     Impression & Recommendations:  Problem # 1:  ASTHMATIC BRONCHITIS, ACUTE (ICD-466.0) tx with antibx, cough med, inhaler, and low dose prednisone taper His updated medication list for this problem includes:    Azithromycin 250 Mg Tabs (Azithromycin) .Marland Kitchen... 1po once daily    Proair Hfa 108 (90 Base) Mcg/act Aers (Albuterol sulfate) .Marland Kitchen... 2 puffs qid prn    Promethazine-codeine 6.25-10 Mg/103ml Syrp (Promethazine-codeine) .Marland Kitchen... 1 tsp by mouth q 6 hrs as needed cough   Problem # 2:  HYPERTENSION (ICD-401.9)  BP today: 119/82 Prior BP: 107/69 (05/03/2007)  Labs Reviewed: Creat: 1.0 (04/23/2006) Chol: 266 (04/23/2006)   HDL: 45.0 (04/23/2006)   LDL: DEL (04/23/2006)   TG: 141 (04/23/2006) stable overall by hx and exam, ok to continue meds/tx as is   Complete Medication List: 1)  Crestor 5 Mg Tabs (Rosuvastatin calcium) .... Every other day 2)  Allegra 180 Mg Tabs (Fexofenadine hcl) .... As needed 3)  Triamcinolone Acetonide 0.1 % Oint (Triamcinolone acetonide) .... Use tid 4)  Vitamin D3 1000 Unit Tabs (Cholecalciferol) .Marland Kitchen.. 1 by mouth daily 5)  Azithromycin  250 Mg Tabs (Azithromycin) .Marland Kitchen.. 1po once daily 6)  Proair Hfa 108 (90 Base) Mcg/act Aers (Albuterol sulfate) .... 2 puffs qid prn 7)  Promethazine-codeine 6.25-10 Mg/32ml Syrp (Promethazine-codeine) .Marland Kitchen.. 1 tsp by mouth q 6 hrs as needed cough 8)  Prednisone 10 Mg Tabs (Prednisone) .... 3po qd for 3days, then 2po qd for 3days, then 1po qd for 3days, then stop   Patient Instructions: 1)  take all new medications as prescribed  2)  continue all medications that you may have been taking previously 3)  Please schedule a follow-up appointment as needed.   Prescriptions: PREDNISONE 10 MG  TABS (PREDNISONE) 3po qd for 3days, then 2po qd for 3days, then 1po qd for 3days, then stop  #18 x 0   Entered and Authorized by:   Corwin Levins MD   Signed by:   Corwin Levins MD on 07/25/2007   Method used:   Print then Give to Patient   RxID:   [id number] PROMETHAZINE-CODEINE 6.25-10 MG/5ML  SYRP (PROMETHAZINE-CODEINE) 1 tsp by mouth q 6 hrs as needed cough  #8 oz x 1   Entered and Authorized by:   Corwin Levins MD   Signed by:   Corwin Levins MD on 07/25/2007   Method used:   Print then Give to Patient   RxID:   [id number] PROAIR HFA 108 (90 BASE) MCG/ACT  AERS (ALBUTEROL SULFATE) 2 puffs qid prn  #1 x 1   Entered and Authorized by:   Corwin Levins MD   Signed by:   Corwin Levins MD on 07/25/2007   Method used:   Print then Give to Patient   RxID:   [id number] AZITHROMYCIN 250 MG  TABS (AZITHROMYCIN) 1po once daily  #6 x 1   Entered and Authorized by:   Corwin Levins MD   Signed by:   Corwin Levins MD on 07/25/2007   Method used:   Print then Give to Patient   RxID:   [id number]  ]

## 2010-03-24 NOTE — Progress Notes (Signed)
Summary: Req a CALL  Phone Note Call from Patient   Caller: [phone] Summary of Call: Patient's wife is req a call back about something that was recieved in the mail.  Initial call taken by: Lamar Sprinkles,  June 01, 2008 5:40 PM  Follow-up for Phone Call        left mess to call office back ....................Marland KitchenLamar Sprinkles  June 03, 2008 3:32 PM   Additional Follow-up for Phone Call Additional follow up Details #1::        Pt is confused. He was referred to pain management by dr Yetta Barre after MRI. Says he was into see Dr Macario Golds and nothing was mentioned. Advised that Dr must not have seen referral. Do you reccomend pt continue with referral to pain management?  Additional Follow-up by: Lamar Sprinkles,  June 03, 2008 3:51 PM    Additional Follow-up for Phone Call Additional follow up Details #2::    If the pain is gone or much better after prednisone - no need to see a pain specialist. If pain is bad - see pain specialist - may need steroid ingections in the cervical spine Follow-up by: Tresa Garter MD,  June 04, 2008 9:23 AM  Additional Follow-up for Phone Call Additional follow up Details #3:: Details for Additional Follow-up Action Taken: pt's wife informed Additional Follow-up by: Lamar Sprinkles,  June 04, 2008 5:56 PM

## 2010-04-11 ENCOUNTER — Other Ambulatory Visit: Payer: Self-pay | Admitting: Internal Medicine

## 2010-04-11 ENCOUNTER — Encounter: Payer: Self-pay | Admitting: Internal Medicine

## 2010-04-11 ENCOUNTER — Ambulatory Visit (INDEPENDENT_AMBULATORY_CARE_PROVIDER_SITE_OTHER): Payer: 59 | Admitting: Internal Medicine

## 2010-04-11 ENCOUNTER — Other Ambulatory Visit: Payer: 59

## 2010-04-11 DIAGNOSIS — Z8639 Personal history of other endocrine, nutritional and metabolic disease: Secondary | ICD-10-CM

## 2010-04-11 DIAGNOSIS — Z862 Personal history of diseases of the blood and blood-forming organs and certain disorders involving the immune mechanism: Secondary | ICD-10-CM

## 2010-04-11 DIAGNOSIS — R1013 Epigastric pain: Secondary | ICD-10-CM

## 2010-04-11 DIAGNOSIS — E785 Hyperlipidemia, unspecified: Secondary | ICD-10-CM

## 2010-04-11 LAB — CBC WITH DIFFERENTIAL/PLATELET
Basophils Absolute: 0 10*3/uL (ref 0.0–0.1)
Basophils Relative: 0.4 % (ref 0.0–3.0)
Eosinophils Absolute: 0.4 10*3/uL (ref 0.0–0.7)
Eosinophils Relative: 10 % — ABNORMAL HIGH (ref 0.0–5.0)
HCT: 41.3 % (ref 39.0–52.0)
Hemoglobin: 14.2 g/dL (ref 13.0–17.0)
Lymphocytes Relative: 41.8 % (ref 12.0–46.0)
Lymphs Abs: 1.6 10*3/uL (ref 0.7–4.0)
MCHC: 34.4 g/dL (ref 30.0–36.0)
MCV: 89.6 fl (ref 78.0–100.0)
Monocytes Absolute: 0.4 10*3/uL (ref 0.1–1.0)
Monocytes Relative: 11.1 % (ref 3.0–12.0)
Neutro Abs: 1.4 10*3/uL (ref 1.4–7.7)
Neutrophils Relative %: 36.7 % — ABNORMAL LOW (ref 43.0–77.0)
Platelets: 185 10*3/uL (ref 150.0–400.0)
RBC: 4.61 Mil/uL (ref 4.22–5.81)
RDW: 13 % (ref 11.5–14.6)
WBC: 3.9 10*3/uL — ABNORMAL LOW (ref 4.5–10.5)

## 2010-04-11 LAB — HEPATIC FUNCTION PANEL
ALT: 32 U/L (ref 0–53)
AST: 40 U/L — ABNORMAL HIGH (ref 0–37)
Albumin: 4.2 g/dL (ref 3.5–5.2)
Alkaline Phosphatase: 49 U/L (ref 39–117)
Bilirubin, Direct: 0.4 mg/dL — ABNORMAL HIGH (ref 0.0–0.3)
Total Bilirubin: 2.3 mg/dL — ABNORMAL HIGH (ref 0.3–1.2)
Total Protein: 6.7 g/dL (ref 6.0–8.3)

## 2010-04-11 LAB — URINALYSIS
Bilirubin Urine: NEGATIVE
Hgb urine dipstick: NEGATIVE
Ketones, ur: NEGATIVE
Leukocytes, UA: NEGATIVE
Nitrite: NEGATIVE
Specific Gravity, Urine: 1.025 (ref 1.000–1.030)
Total Protein, Urine: NEGATIVE
Urine Glucose: NEGATIVE
Urobilinogen, UA: 0.2 (ref 0.0–1.0)
pH: 6 (ref 5.0–8.0)

## 2010-04-11 LAB — BASIC METABOLIC PANEL
BUN: 14 mg/dL (ref 6–23)
CO2: 30 mEq/L (ref 19–32)
Calcium: 9.2 mg/dL (ref 8.4–10.5)
Chloride: 105 mEq/L (ref 96–112)
Creatinine, Ser: 1.1 mg/dL (ref 0.4–1.5)
GFR: 78.88 mL/min (ref >60.00)
Glucose, Bld: 130 mg/dL — ABNORMAL HIGH (ref 70–99)
Potassium: 4.4 mEq/L (ref 3.5–5.1)
Sodium: 141 mEq/L (ref 135–145)

## 2010-04-11 LAB — LIPASE: Lipase: 47 U/L (ref 11.0–59.0)

## 2010-04-11 LAB — CONVERTED CEMR LAB: Helicobacter Pylori Antibody-IgG: <0.4

## 2010-04-12 LAB — SEDIMENTATION RATE: Sed Rate: 6 mm/hr (ref 0–22)

## 2010-04-15 ENCOUNTER — Other Ambulatory Visit: Payer: Self-pay | Admitting: Internal Medicine

## 2010-04-15 DIAGNOSIS — R1013 Epigastric pain: Secondary | ICD-10-CM

## 2010-04-19 NOTE — Assessment & Plan Note (Signed)
Summary: pain upper left abdomen when eating/lb   Vital Signs:  Patient profile:   45 year old male Height:      65 inches Weight:      165 pounds BMI:     27.56 Temp:     98.1 degrees F oral Pulse rate:   64 / minute Pulse rhythm:   regular Resp:     16 per minute BP sitting:   104 / 80  (left arm) Cuff size:   regular  Vitals Entered By: Lanier Prude, CMA(AAMA) (April 11, 2010 4:20 PM) CC: LUQ abd pain X 5-6 wks Is Patient Diabetic? No   CC:  LUQ abd pain X 5-6 wks.  History of Present Illness: c/o  in epig and LLQ pain worse after eating x 5-6 wks  - bad right after he eats - may last x hrs Better in the night. He stopped Crestor - it did not help....  Current Medications (verified): 1)  Crestor 5 Mg  Tabs (Rosuvastatin Calcium) .Marland Kitchen.. 1 Every  Day 2)  Allegra 180 Mg  Tabs (Fexofenadine Hcl) .... As Needed 3)  Triamcinolone Acetonide 0.1 %  Oint (Triamcinolone Acetonide) .... Use Tid 4)  Vitamin D3 1000 Unit  Tabs (Cholecalciferol) .Marland Kitchen.. 1 By Mouth Daily 5)  Proair Hfa 108 (90 Base) Mcg/act  Aers (Albuterol Sulfate) .... 2 Puffs Qid As Needed 6)  Naproxen 375 Mg Tabs (Naproxen) .... One By Mouth Two Times A Day As Needed For Pain 7)  Tramadol Hcl 50 Mg Tabs (Tramadol Hcl) .Marland Kitchen.. 1-2 By Mouth As Needed For Pain 8)  Valium 5 Mg Tabs (Diazepam) .... 1/2 - 1 By Mouth Before Xray 9)  Rapaflo 8 Mg Caps (Silodosin) .Marland Kitchen.. 1 By Mouth Once Daily As Needed Urinary Issues 10)  Vitamin D3 1000 Unit  Tabs (Cholecalciferol) .Marland Kitchen.. 1 By Mouth Daily  Allergies (verified): 1)  ! Percocet 2)  ! 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 No apparent alopecia or balding. Eyes:  No corneal or conjunctival inflammation noted. EOMI. Perrla. Funduscopic exam benign, without hemorrhages, exudates or papilledema. Vision grossly normal. Ears:  External ear exam shows no significant lesions or deformities.  Otoscopic examination reveals clear canals, tympanic membranes are intact bilaterally without bulging, retraction, inflammation or discharge. Hearing is grossly normal bilaterally. Nose:  External nasal examination shows no deformity or inflammation. Nasal mucosa are pink and moist without lesions or exudates. Mouth:  Oral mucosa and oropharynx without lesions or exudates.  Teeth in good repair. Neck:  No deformities, masses, or tenderness noted. Lungs:  Normal respiratory effort, chest expands symmetrically. Lungs are clear to auscultation, no crackles or wheezes. Heart:  Normal rate and regular rhythm. S1 and S2 normal without gallop, murmur, click, rub or other extra sounds. Abdomen:  Epig area is sensitiveno masses, no guarding, no rigidity, no abdominal hernia, no hepatomegaly, and no splenomegaly.   Msk:  No deformity or scoliosis noted of thoracic or lumbar spine.   Neurologic:  No cranial nerve deficits noted. Station and gait are normal. Plantar reflexes are down-going bilaterally. DTRs  are symmetrical throughout. Sensory, motor and coordinative functions appear intact.   Impression & Recommendations:  Problem # 1:  ABDOMINAL PAIN, EPIGASTRIC (ICD-789.06) Assessment New  [place] if not better in 1 wk  Orders: T-Helicobacter AB - IgG (16109-60454) TLB-BMP (Basic Metabolic Panel-BMET) (80048-METABOL) TLB-CBC Platelet - w/Differential (85025-CBCD) TLB-Hepatic/Liver Function Pnl (80076-HEPATIC) TLB-Lipase  (83690-LIPASE) TLB-Udip ONLY (81003-UDIP) TLB-Sedimentation Rate (ESR) (85652-ESR) Radiology Referral (Radiology)  Problem # 2:  LIVER FUNCTION TESTS, ABNORMAL, HX OF (ICD-V12.2) Assessment: New  Orders: Radiology Referral (Radiology) Abd [place]  Problem # 3:  HYPERLIPIDEMIA (ICD-272.4) Assessment: Comment Only He has been off Crestor x wks His updated medication list for this problem includes:    Crestor 5 Mg Tabs (Rosuvastatin calcium) .Marland Kitchen... 1 every  day  Complete Medication List: 1)  Crestor 5 Mg Tabs (Rosuvastatin calcium) .Marland Kitchen.. 1 every  day 2)  Allegra 180 Mg Tabs (Fexofenadine hcl) .... As needed 3)  Triamcinolone Acetonide 0.1 % Oint (Triamcinolone acetonide) .... Use tid 4)  Vitamin D3 1000 Unit Tabs (Cholecalciferol) .Marland Kitchen.. 1 by mouth daily 5)  Proair Hfa 108 (90 Base) Mcg/act Aers (Albuterol sulfate) .... 2 puffs qid as needed 6)  Tramadol Hcl 50 Mg Tabs (Tramadol hcl) .Marland Kitchen.. 1-2 by mouth as needed for pain 7)  Valium 5 Mg Tabs (Diazepam) .... 1/2 - 1 by mouth before xray 8)  Rapaflo 8 Mg Caps (Silodosin) .Marland Kitchen.. 1 by mouth once daily as needed urinary issues 9)  Vitamin D3 1000 Unit Tabs (Cholecalciferol) .Marland Kitchen.. 1 by mouth daily 10)  Nexium 40 Mg Cpdr (Esomeprazole magnesium) .Marland Kitchen.. 1 by mouth qam ( medically necessary)  Patient Instructions: 1)  Take Nexium two times a day x 1 wk, then 1 by mouth once daily  2)  Please schedule a follow-up appointment in 4-6 weeks. 3)  Call if you are not better in a reasonable amount of time or if worse. Go to ER if feeling really bad!  Prescriptions: NEXIUM 40 MG CPDR (ESOMEPRAZOLE MAGNESIUM) 1 by mouth qam ( medically necessary)  #30 x 3   Entered and Authorized by:   Tresa Garter MD   Signed by:   Tresa Garter MD on 04/11/2010   Method used:   Print then Give to Patient   RxID:   [id number]    Orders Added: 1)  T-Helicobacter AB - IgG [[id number]] 2)  TLB-BMP (Basic Metabolic Panel-BMET) [80048-METABOL] 3)  TLB-CBC  Platelet - w/Differential [85025-CBCD] 4)  TLB-Hepatic/Liver Function Pnl [80076-HEPATIC] 5)  TLB-Lipase [83690-LIPASE] 6)  TLB-Udip ONLY [81003-UDIP] 7)  TLB-Sedimentation Rate (ESR) [85652-ESR] 8)  Radiology Referral [Radiology] 9)  Est. Patient Level IV [62952]

## 2010-04-21 ENCOUNTER — Ambulatory Visit
Admission: RE | Admit: 2010-04-21 | Discharge: 2010-04-21 | Disposition: A | Discharge status: Home / Self Care | Payer: 59 | Source: Ambulatory Visit | Attending: Internal Medicine | Admitting: Internal Medicine

## 2010-04-21 DIAGNOSIS — R1013 Epigastric pain: Secondary | ICD-10-CM

## 2010-04-22 ENCOUNTER — Telehealth: Payer: Self-pay | Admitting: Internal Medicine

## 2010-05-03 NOTE — Progress Notes (Signed)
Summary: Results Inquiry  Phone Note Call from Patient   Caller: Patient [phone] Call For: Ultrasound Results Summary of Call: Pt called requesting results of Ultrasound done 04/21/2010. Initial call taken by: Burnard Leigh [facility]),  April 22, 2010 4:34 PM  Follow-up for Phone Call        [place] is normal except for mild fatty infiltration of the liver Keep return office visit  Follow-up by: Tresa Garter MD,  April 22, 2010 5:54 PM  Additional Follow-up for Phone Call Additional follow up Details #1::        No answer Additional Follow-up by: Lamar Sprinkles, CMA,  April 22, 2010 6:35 PM    Additional Follow-up for Phone Call Additional follow up Details #2::    left mess to call office back......................Marland KitchenLamar Sprinkles, CMA  April 25, 2010 5:26 PM   Additional Follow-up for Phone Call Additional follow up Details #3:: Details for Additional Follow-up Action Taken: Pt informed  Additional Follow-up by: Lamar Sprinkles, CMA,  April 26, 2010 9:37 AM

## 2010-05-26 ENCOUNTER — Ambulatory Visit (INDEPENDENT_AMBULATORY_CARE_PROVIDER_SITE_OTHER): Payer: 59 | Admitting: Internal Medicine

## 2010-05-26 ENCOUNTER — Encounter: Payer: Self-pay | Admitting: Internal Medicine

## 2010-05-26 DIAGNOSIS — K760 Fatty (change of) liver, not elsewhere classified: Secondary | ICD-10-CM | POA: Insufficient documentation

## 2010-05-26 DIAGNOSIS — Z862 Personal history of diseases of the blood and blood-forming organs and certain disorders involving the immune mechanism: Secondary | ICD-10-CM

## 2010-05-26 DIAGNOSIS — Z8639 Personal history of other endocrine, nutritional and metabolic disease: Secondary | ICD-10-CM

## 2010-05-26 DIAGNOSIS — R7309 Other abnormal glucose: Secondary | ICD-10-CM

## 2010-05-26 DIAGNOSIS — K7689 Other specified diseases of liver: Secondary | ICD-10-CM

## 2010-05-26 DIAGNOSIS — R739 Hyperglycemia, unspecified: Secondary | ICD-10-CM | POA: Insufficient documentation

## 2010-05-26 LAB — GLUCOSE, POCT (MANUAL RESULT ENTRY): POC Glucose: 95

## 2010-05-26 NOTE — Assessment & Plan Note (Signed)
Fatty liver is present

## 2010-05-26 NOTE — Progress Notes (Signed)
  Subjective:    Patient ID: Kyle Griffin, male    DOB: 13-May-1965, 45 y.o.   MRN: [id number]  HPI F/u fatty liver, abd LFTs, low WBCs, abd pain   Review of Systems     Objective:   Physical Exam        Assessment & Plan:

## 2010-05-26 NOTE — Assessment & Plan Note (Signed)
Check CBGs with Freestyle. CBG 95 now.

## 2010-05-26 NOTE — Patient Instructions (Addendum)
Check sugars several times and record Labs prior to next visit

## 2010-07-08 NOTE — Assessment & Plan Note (Signed)
[facility]                           PRIMARY CARE OFFICE NOTE   NAME:WILLIAMSONAncel, Easler                   MRN:          [id number]  DATE:04/29/2006                            DOB:          08-06-1965    REASON FOR VISIT:  The patient is a 45 year old male who presents for a  wellness examination.   PAST MEDICAL HISTORY:  As per 12/05/04 note.   FAMILY HISTORY:  As per 12/05/04 note.   SOCIAL HISTORY:  As per 12/05/04 note.   MEDICATIONS:  He stopped the Zetia . He is taking baby aspirin.   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   ALLERGIES:  PERCOCET  LIPITOR, caused elevated liver function test.   REVIEW OF SYSTEMS:  No chest pain or shortness of breath. He is back  skating. There are no exertional symptoms. There is no weight loss or  weight gain. The rest of the 18 point review of systems is negative.   PHYSICAL EXAMINATION:  VITAL SIGNS: Blood pressure 120/75, pulse 64,  temperature 98.0, weight 257 pounds.  GENERAL: He looks good and is no acute distress.  HEENT: Moist mucosa.  NECK: Supple, no thyromegaly or bruit.  LUNGS: Clear. No wheeze or rales.  HEART: S1, S2, no murmur or gallop.  ABDOMEN: Soft and nontender, no organomegaly or masses.  EXTREMITIES: Without edema.  NEUROLOGIC: He is alert and oriented. He denies being depressed.  GENITALIA: Normal external genitalia. Testicles free of masses.   LABORATORY DATA:  04/23/06 White count 2.9, C-met normal, total bilirubin  2.6, cholesterol 266, TSH normal, LDL 181.   ASSESSMENT AND PLAN:  1. Normal wellness examination: Age/health related issues discussed.      Lifestyle discussed. Continue with active lifestyle and good      Mediterranean diet.  2. Dyslipidemia. Options discussed. We will try Crestor 5 mg every      other day. Obtain labs in a couple of months. If he does not feel      well, discontinue Crestor and  check liver function test      immediately. He has a history of previous liver function test      elevation from Lipitor.      Hopefully, there will be no problems with Crestor.  3. Decreased white count, most likely due to recent upper respiratory      infection. We will recheck with the next blood work.     Georgina Quint. Plotnikov, MD  Electronically Signed    AVP/MedQ  DD: 04/29/2006  DT: 04/30/2006  Job #: [id number]

## 2010-09-20 ENCOUNTER — Other Ambulatory Visit: Payer: Self-pay | Admitting: *Deleted

## 2010-09-20 MED ORDER — ESOMEPRAZOLE MAGNESIUM 40 MG PO CPDR: 40.0000 mg | DELAYED_RELEASE_CAPSULE | Freq: Every day | ORAL | Status: DC

## 2010-09-20 NOTE — Progress Notes (Signed)
Needs RF to [facility], DONE

## 2010-10-12 ENCOUNTER — Other Ambulatory Visit: Payer: Self-pay | Admitting: Internal Medicine

## 2010-10-12 ENCOUNTER — Other Ambulatory Visit (INDEPENDENT_AMBULATORY_CARE_PROVIDER_SITE_OTHER): Payer: 59

## 2010-10-12 ENCOUNTER — Ambulatory Visit (INDEPENDENT_AMBULATORY_CARE_PROVIDER_SITE_OTHER): Payer: 59 | Admitting: Internal Medicine

## 2010-10-12 ENCOUNTER — Encounter: Payer: Self-pay | Admitting: Internal Medicine

## 2010-10-12 DIAGNOSIS — I10 Essential (primary) hypertension: Secondary | ICD-10-CM

## 2010-10-12 DIAGNOSIS — R739 Hyperglycemia, unspecified: Secondary | ICD-10-CM

## 2010-10-12 DIAGNOSIS — R7309 Other abnormal glucose: Secondary | ICD-10-CM

## 2010-10-12 DIAGNOSIS — R945 Abnormal results of liver function studies: Secondary | ICD-10-CM

## 2010-10-12 DIAGNOSIS — K760 Fatty (change of) liver, not elsewhere classified: Secondary | ICD-10-CM

## 2010-10-12 DIAGNOSIS — R39198 Other difficulties with micturition: Secondary | ICD-10-CM

## 2010-10-12 DIAGNOSIS — K7689 Other specified diseases of liver: Secondary | ICD-10-CM

## 2010-10-12 DIAGNOSIS — Z862 Personal history of diseases of the blood and blood-forming organs and certain disorders involving the immune mechanism: Secondary | ICD-10-CM

## 2010-10-12 LAB — COMPREHENSIVE METABOLIC PANEL
ALT: 46 U/L (ref 0–53)
AST: 29 U/L (ref 0–37)
Albumin: 4.2 g/dL (ref 3.5–5.2)
Alkaline Phosphatase: 57 U/L (ref 39–117)
BUN: 18 mg/dL (ref 6–23)
CO2: 31 mEq/L (ref 19–32)
Calcium: 9.7 mg/dL (ref 8.4–10.5)
Chloride: 106 mEq/L (ref 96–112)
Creatinine, Ser: 1.3 mg/dL (ref 0.4–1.5)
GFR: 63.43 mL/min (ref >60.00)
Glucose, Bld: 101 mg/dL — ABNORMAL HIGH (ref 70–99)
Potassium: 5.3 mEq/L — ABNORMAL HIGH (ref 3.5–5.1)
Sodium: 141 mEq/L (ref 135–145)
Total Bilirubin: 2.3 mg/dL — ABNORMAL HIGH (ref 0.3–1.2)
Total Protein: 7 g/dL (ref 6.0–8.3)

## 2010-10-12 LAB — LIPID PANEL
Cholesterol: 284 mg/dL — ABNORMAL HIGH (ref 0–200)
HDL: 49.8 mg/dL (ref >39.00)
Total CHOL/HDL Ratio: 6
Triglycerides: 227 mg/dL — ABNORMAL HIGH (ref 0.0–149.0)
VLDL: 45.4 mg/dL — ABNORMAL HIGH (ref 0.0–40.0)

## 2010-10-12 LAB — HEMOGLOBIN A1C: Hgb A1c MFr Bld: 5.5 % (ref 4.6–6.5)

## 2010-10-12 LAB — LDL CHOLESTEROL, DIRECT: Direct LDL: 177.7 mg/dL

## 2010-10-12 NOTE — Assessment & Plan Note (Signed)
Check labs 

## 2010-10-12 NOTE — Progress Notes (Signed)
  Subjective:    Patient ID: Kyle Griffin, male    DOB: Dec 14, 1965, 45 y.o.   MRN: [id number]  HPI   The patient presents for a follow-up of  chronic chronic dyslipidemia, elev glucose, fatty liver - controlled with medicines   Review of Systems  Constitutional: Negative for appetite change, fatigue and unexpected weight change.  HENT: Negative for nosebleeds, congestion, sore throat, sneezing, trouble swallowing and neck pain.   Eyes: Negative for itching and visual disturbance.  Respiratory: Negative for cough.   Cardiovascular: Negative for chest pain, palpitations and leg swelling.  Gastrointestinal: Negative for nausea, diarrhea, blood in stool and abdominal distention.  Genitourinary: Positive for difficulty urinating (rare). Negative for frequency and hematuria.  Musculoskeletal: Negative for back pain, joint swelling and gait problem.  Skin: Negative for rash.  Neurological: Negative for dizziness, tremors, speech difficulty and weakness.  Psychiatric/Behavioral: Negative for sleep disturbance, dysphoric mood and agitation. The patient is not nervous/anxious.        Objective:   Physical Exam  Constitutional: He is oriented to person, place, and time. He appears well-developed.  HENT:  Mouth/Throat: Oropharynx is clear and moist.  Eyes: Conjunctivae are normal. Pupils are equal, round, and reactive to light.  Neck: Normal range of motion. No JVD present. No thyromegaly present.  Cardiovascular: Normal rate, regular rhythm, normal heart sounds and intact distal pulses.  Exam reveals no gallop and no friction rub.   No murmur heard. Pulmonary/Chest: Effort normal and breath sounds normal. No respiratory distress. He has no wheezes. He has no rales. He exhibits no tenderness.  Abdominal: Soft. Bowel sounds are normal. He exhibits no distension and no mass. There is no tenderness. There is no rebound and no guarding.  Musculoskeletal: Normal range of motion. He exhibits no  edema and no tenderness.  Lymphadenopathy:    He has no cervical adenopathy.  Neurological: He is alert and oriented to person, place, and time. He has normal reflexes. No cranial nerve deficit. He exhibits normal muscle tone. Coordination normal.  Skin: Skin is warm and dry. No rash noted.  Psychiatric: He has a normal mood and affect. His behavior is normal. Judgment and thought content normal.          Assessment & Plan:

## 2010-10-12 NOTE — Assessment & Plan Note (Signed)
Cont Rx 

## 2010-10-14 ENCOUNTER — Telehealth: Payer: Self-pay | Admitting: Internal Medicine

## 2010-10-14 NOTE — Telephone Encounter (Signed)
Noted. Thx.

## 2010-10-14 NOTE — Telephone Encounter (Signed)
Kyle Griffin, please, inform patient that all labs are normal except for elev chol. How is he taking Crestor?  Please, mail the labs to the patient.     Thx

## 2010-10-14 NOTE — Telephone Encounter (Signed)
Pt informed and he states he missed a couple weeks prior to having his labs drawn but when he takes it he takes Crestor 5 mg qod.  Copies mailed

## 2011-02-06 ENCOUNTER — Ambulatory Visit (INDEPENDENT_AMBULATORY_CARE_PROVIDER_SITE_OTHER): Payer: 59 | Admitting: Internal Medicine

## 2011-02-06 ENCOUNTER — Other Ambulatory Visit (INDEPENDENT_AMBULATORY_CARE_PROVIDER_SITE_OTHER): Payer: 59

## 2011-02-06 ENCOUNTER — Encounter: Payer: Self-pay | Admitting: Internal Medicine

## 2011-02-06 VITALS — BP 118/80 | Temp 98.2°F | Wt 164.0 lb

## 2011-02-06 DIAGNOSIS — K5229 Other allergic and dietetic gastroenteritis and colitis: Secondary | ICD-10-CM

## 2011-02-06 DIAGNOSIS — T7840XA Allergy, unspecified, initial encounter: Secondary | ICD-10-CM

## 2011-02-06 DIAGNOSIS — R197 Diarrhea, unspecified: Secondary | ICD-10-CM

## 2011-02-06 DIAGNOSIS — Z91011 Allergy to milk products: Secondary | ICD-10-CM

## 2011-02-06 LAB — CBC WITH DIFFERENTIAL/PLATELET
Basophils Absolute: 0 10*3/uL (ref 0.0–0.1)
Basophils Relative: 0.7 % (ref 0.0–3.0)
Eosinophils Absolute: 0.4 10*3/uL (ref 0.0–0.7)
Eosinophils Relative: 9.2 % — ABNORMAL HIGH (ref 0.0–5.0)
HCT: 44.4 % (ref 39.0–52.0)
Hemoglobin: 15.5 g/dL (ref 13.0–17.0)
Lymphocytes Relative: 33.1 % (ref 12.0–46.0)
Lymphs Abs: 1.5 10*3/uL (ref 0.7–4.0)
MCHC: 35 g/dL (ref 30.0–36.0)
MCV: 88.6 fl (ref 78.0–100.0)
Monocytes Absolute: 0.5 10*3/uL (ref 0.1–1.0)
Monocytes Relative: 10.5 % (ref 3.0–12.0)
Neutro Abs: 2.1 10*3/uL (ref 1.4–7.7)
Neutrophils Relative %: 46.5 % (ref 43.0–77.0)
Platelets: 197 10*3/uL (ref 150.0–400.0)
RBC: 5.01 Mil/uL (ref 4.22–5.81)
RDW: 12.8 % (ref 11.5–14.6)
WBC: 4.4 10*3/uL — ABNORMAL LOW (ref 4.5–10.5)

## 2011-02-06 LAB — SEDIMENTATION RATE: Sed Rate: 6 mm/hr (ref 0–22)

## 2011-02-06 MED ORDER — ALIGN 4 MG PO CAPS: 1.0000 | ORAL_CAPSULE | Freq: Every day | ORAL | Status: DC

## 2011-02-06 MED ORDER — METRONIDAZOLE 250 MG PO TABS: 250.0000 mg | ORAL_TABLET | Freq: Three times a day (TID) | ORAL | Status: AC

## 2011-02-06 NOTE — Patient Instructions (Signed)
No alcohol while on Flagyl Avoid milk

## 2011-02-06 NOTE — Assessment & Plan Note (Signed)
Flagyl and Align

## 2011-02-06 NOTE — Progress Notes (Signed)
  Subjective:    Patient ID: Kyle Griffin, male    DOB: [birth year], 45 y.o.   MRN: [id number]  HPI  C/o diarrhea x 2 mo x1-2 mo - no pattern - gas  Review of Systems  Constitutional: Negative for appetite change, fatigue and unexpected weight change.  HENT: Negative for nosebleeds, congestion, sore throat, sneezing, trouble swallowing and neck pain.   Eyes: Negative for itching and visual disturbance.  Respiratory: Negative for cough.   Cardiovascular: Negative for chest pain, palpitations and leg swelling.  Gastrointestinal: Negative for nausea, diarrhea, blood in stool and abdominal distention.  Genitourinary: Negative for frequency and hematuria.  Musculoskeletal: Negative for back pain, joint swelling and gait problem.  Skin: Negative for rash.  Neurological: Negative for dizziness, tremors, speech difficulty and weakness.  Psychiatric/Behavioral: Negative for sleep disturbance, dysphoric mood and agitation. The patient is not nervous/anxious.        Objective:   Physical Exam  Constitutional: He is oriented to person, place, and time. He appears well-developed.  HENT:  Mouth/Throat: Oropharynx is clear and moist.  Eyes: Conjunctivae are normal. Pupils are equal, round, and reactive to light.  Neck: Normal range of motion. No JVD present. No thyromegaly present.  Cardiovascular: Normal rate, regular rhythm, normal heart sounds and intact distal pulses.  Exam reveals no gallop and no friction rub.   No murmur heard. Pulmonary/Chest: Effort normal and breath sounds normal. No respiratory distress. He has no wheezes. He has no rales. He exhibits no tenderness.  Abdominal: Soft. Bowel sounds are normal. He exhibits no distension and no mass. There is no tenderness. There is no rebound and no guarding.  Musculoskeletal: Normal range of motion. He exhibits no edema and no tenderness.  Lymphadenopathy:    He has no cervical adenopathy.  Neurological: He is alert and oriented to  person, place, and time. He has normal reflexes. No cranial nerve deficit. He exhibits normal muscle tone. Coordination normal.  Skin: Skin is warm and dry. No rash noted.  Psychiatric: He has a normal mood and affect. His behavior is normal. Judgment and thought content normal.          Assessment & Plan:

## 2011-02-07 LAB — ALLERGEN FOOD PROFILE SPECIFIC IGE
Apple: <0.1 kU/L (ref <0.35)
Chicken IgE: <0.1 kU/L (ref <0.35)
Corn: <0.1 kU/L (ref <0.35)
Egg White IgE: <0.1 kU/L (ref <0.35)
Fish Cod: <0.1 kU/L (ref <0.35)
IgE (Immunoglobulin E), Serum: 35 IU/mL (ref 0.0–180.0)
Milk IgE: <0.1 kU/L (ref <0.35)
Orange: <0.1 kU/L (ref <0.35)
Peanut IgE: <0.1 kU/L (ref <0.35)
Shrimp IgE: <0.1 kU/L (ref <0.35)
Soybean IgE: <0.1 kU/L (ref <0.35)
Tomato IgE: <0.1 kU/L (ref <0.35)
Tuna IgE: <0.1 kU/L (ref <0.35)
Wheat IgE: <0.1 kU/L (ref <0.35)

## 2011-02-11 ENCOUNTER — Encounter: Payer: Self-pay | Admitting: Internal Medicine

## 2011-02-15 ENCOUNTER — Telehealth: Payer: Self-pay | Admitting: *Deleted

## 2011-02-15 NOTE — Telephone Encounter (Signed)
Pt spouse is here requesting results back from labs done last Monday. Pls call husband cell @ [phone]...02/15/11@1 :16pm/LMB

## 2011-02-15 NOTE — Telephone Encounter (Signed)
Please advise on 02-06-11 labs

## 2011-04-14 ENCOUNTER — Ambulatory Visit: Payer: 59 | Admitting: Internal Medicine

## 2011-05-08 ENCOUNTER — Encounter: Payer: Self-pay | Admitting: Internal Medicine

## 2011-05-10 ENCOUNTER — Emergency Department (HOSPITAL_COMMUNITY)
Admission: EM | Admit: 2011-05-10 | Discharge: 2011-05-11 | Disposition: A | Discharge status: Home / Self Care | Payer: 59 | Attending: Emergency Medicine | Admitting: Emergency Medicine

## 2011-05-10 ENCOUNTER — Encounter (HOSPITAL_COMMUNITY): Payer: Self-pay | Admitting: Emergency Medicine

## 2011-05-10 DIAGNOSIS — Z79899 Other long term (current) drug therapy: Secondary | ICD-10-CM | POA: Insufficient documentation

## 2011-05-10 DIAGNOSIS — R109 Unspecified abdominal pain: Secondary | ICD-10-CM | POA: Insufficient documentation

## 2011-05-10 DIAGNOSIS — K219 Gastro-esophageal reflux disease without esophagitis: Secondary | ICD-10-CM | POA: Insufficient documentation

## 2011-05-10 DIAGNOSIS — K589 Irritable bowel syndrome without diarrhea: Secondary | ICD-10-CM | POA: Insufficient documentation

## 2011-05-10 DIAGNOSIS — N1330 Unspecified hydronephrosis: Secondary | ICD-10-CM | POA: Insufficient documentation

## 2011-05-10 DIAGNOSIS — N201 Calculus of ureter: Secondary | ICD-10-CM | POA: Insufficient documentation

## 2011-05-10 DIAGNOSIS — E785 Hyperlipidemia, unspecified: Secondary | ICD-10-CM | POA: Insufficient documentation

## 2011-05-10 DIAGNOSIS — J45909 Unspecified asthma, uncomplicated: Secondary | ICD-10-CM | POA: Insufficient documentation

## 2011-05-10 DIAGNOSIS — N200 Calculus of kidney: Secondary | ICD-10-CM

## 2011-05-10 HISTORY — DX: Calculus of kidney: N20.0

## 2011-05-10 LAB — CBC
HCT: 39.3 % (ref 39.0–52.0)
Hemoglobin: 14.1 g/dL (ref 13.0–17.0)
MCH: 29.9 pg (ref 26.0–34.0)
MCHC: 35.9 g/dL (ref 30.0–36.0)
MCV: 83.3 fL (ref 78.0–100.0)
Platelets: 149 10*3/uL — ABNORMAL LOW (ref 150–400)
RBC: 4.72 MIL/uL (ref 4.22–5.81)
RDW: 12.3 % (ref 11.5–15.5)
WBC: 11.1 10*3/uL — ABNORMAL HIGH (ref 4.0–10.5)

## 2011-05-10 LAB — DIFFERENTIAL
Basophils Absolute: 0 10*3/uL (ref 0.0–0.1)
Basophils Relative: 0 % (ref 0–1)
Eosinophils Absolute: 0.3 10*3/uL (ref 0.0–0.7)
Eosinophils Relative: 2 % (ref 0–5)
Lymphocytes Relative: 10 % — ABNORMAL LOW (ref 12–46)
Lymphs Abs: 1.1 10*3/uL (ref 0.7–4.0)
Monocytes Absolute: 0.9 10*3/uL (ref 0.1–1.0)
Monocytes Relative: 8 % (ref 3–12)
Neutro Abs: 8.9 10*3/uL — ABNORMAL HIGH (ref 1.7–7.7)
Neutrophils Relative %: 80 % — ABNORMAL HIGH (ref 43–77)

## 2011-05-10 LAB — BASIC METABOLIC PANEL
BUN: 12 mg/dL (ref 6–23)
CO2: 26 mEq/L (ref 19–32)
Calcium: 9.3 mg/dL (ref 8.4–10.5)
Chloride: 104 mEq/L (ref 96–112)
Creatinine, Ser: 1.25 mg/dL (ref 0.50–1.35)
GFR calc Af Amer: 78 mL/min — ABNORMAL LOW (ref >90)
GFR calc non Af Amer: 67 mL/min — ABNORMAL LOW (ref >90)
Glucose, Bld: 124 mg/dL — ABNORMAL HIGH (ref 70–99)
Potassium: 3.8 mEq/L (ref 3.5–5.1)
Sodium: 140 mEq/L (ref 135–145)

## 2011-05-10 NOTE — ED Notes (Signed)
PT. REPORTS RIGHT FLANK PAIN RADIATING TO RIGHT LOWER BACK AND GROIN ONSET THIS EVENING , DENIES HEMATURIA OR DYSURIA , NO INJURY , STATES HISTORY OF KIDNEY STONES. NO FEVER OR CHILLS.

## 2011-05-10 NOTE — ED Notes (Signed)
Pt notified that urine sample is needed 

## 2011-05-11 ENCOUNTER — Emergency Department (HOSPITAL_COMMUNITY): Payer: 59

## 2011-05-11 ENCOUNTER — Encounter (HOSPITAL_COMMUNITY): Payer: Self-pay | Admitting: Radiology

## 2011-05-11 LAB — URINALYSIS, ROUTINE W REFLEX MICROSCOPIC
Bilirubin Urine: NEGATIVE
Glucose, UA: NEGATIVE mg/dL
Ketones, ur: NEGATIVE mg/dL
Leukocytes, UA: NEGATIVE
Nitrite: NEGATIVE
Protein, ur: NEGATIVE mg/dL
Specific Gravity, Urine: 1.019 (ref 1.005–1.030)
Urobilinogen, UA: 0.2 mg/dL (ref 0.0–1.0)
pH: 5 (ref 5.0–8.0)

## 2011-05-11 LAB — URINE MICROSCOPIC-ADD ON

## 2011-05-11 MED ORDER — HYDROMORPHONE HCL PF 1 MG/ML IJ SOLN
1.0000 mg | Freq: Once | INTRAMUSCULAR | Status: AC
  Administered 2011-05-11: 1 mg via INTRAVENOUS
  Filled 2011-05-11: qty 1

## 2011-05-11 MED ORDER — SODIUM CHLORIDE 0.9 % IV BOLUS (SEPSIS)
1000.0000 mL | Freq: Once | INTRAVENOUS | Status: AC
  Administered 2011-05-11: 1000 mL via INTRAVENOUS

## 2011-05-11 MED ORDER — HYDROCODONE-ACETAMINOPHEN 5-325 MG PO TABS: 2.0000 | ORAL_TABLET | ORAL | Status: AC | PRN

## 2011-05-11 MED ORDER — ONDANSETRON HCL 4 MG/2ML IJ SOLN
4.0000 mg | Freq: Once | INTRAMUSCULAR | Status: AC
  Administered 2011-05-11: 4 mg via INTRAVENOUS
  Filled 2011-05-11: qty 2

## 2011-05-11 MED ORDER — ONDANSETRON 4 MG PO TBDP: 4.0000 mg | ORAL_TABLET | Freq: Three times a day (TID) | ORAL | Status: AC | PRN

## 2011-05-11 NOTE — ED Notes (Signed)
Pt reports having left sided flank pain with nausea since 8:30 last night. Pt denies any vomiting, diarrhea or urinary problems. Pt does report having hx of kidney stones but doesn't remember if s/s was similar. Pt is restless and appears to having mild distress due to pain. Will continue to monitor pt.

## 2011-05-11 NOTE — ED Provider Notes (Signed)
History     CSN: [id number]  Arrival date & time 05/10/11  2110   First MD Initiated Contact with Patient 05/10/11 2316      Chief Complaint  Patient presents with  . Flank Pain    (Consider location/radiation/quality/duration/timing/severity/associated sxs/prior treatment) HPI Hx obtained from pt. 45yoM with PMH of nephrolithiasis presents with R flank pain which has been present since about 2030 this evening. States pain is sharp and colicky in nature, unable to find a comfortable position. Pain begins in R flank area and radiates around to groin. Pain is similar to previous pain with stones. Denies noting hematuria, pyuria, but states it was uncomfortable when he had to urinate to give a sample of urine on presentation to the ED. Denies f/c, abd pain, n/v/d, testicular pain/swelling.  States he has seen a urologist in the past with his kidney stones but has been able to pass them on his own.  Past Medical History  Diagnosis Date  . Hyperlipidemia   . GERD (gastroesophageal reflux disease)   . Nephrolithiasis   . IBS (irritable bowel syndrome)   . Allergic rhinitis   . Asthma, exercise induced   . Gilbert disease   . Renal stones     Past Surgical History  Procedure Date  . Rotator cuff repair   . Foot surgery     Cyst, Heel    Family History  Problem Relation Age of Onset  . Coronary artery disease Mother   . Stroke Mother   . Heart disease Mother   . Hyperlipidemia Mother   . Cancer Mother 50    lung ca  . Heart disease Father   . Hyperlipidemia Father   . Diabetes Neg Hx     History  Substance Use Topics  . Smoking status: Former Games developer  . Smokeless tobacco: Not on file  . Alcohol Use: No      Review of Systems  Constitutional: Negative for fever, chills, activity change and appetite change.  Respiratory: Negative for chest tightness and shortness of breath.   Cardiovascular: Negative for chest pain and palpitations.  Gastrointestinal: Negative  for nausea, vomiting, abdominal pain and diarrhea.  Genitourinary: Positive for flank pain. Negative for dysuria, hematuria, decreased urine volume, scrotal swelling, difficulty urinating, penile pain and testicular pain.  Musculoskeletal: Negative for myalgias.  Skin: Negative for color change and rash.  Neurological: Negative for dizziness and weakness.    Allergies  Oxycodone-acetaminophen  Home Medications   Current Outpatient Rx  Name Route Sig Dispense Refill  . ALBUTEROL SULFATE HFA 108 (90 BASE) MCG/ACT IN AERS Inhalation Inhale 2 puffs into the lungs 4 (four) times daily as needed. For wheezing    . ESOMEPRAZOLE MAGNESIUM 40 MG PO CPDR Oral Take 40 mg by mouth daily as needed. For upset stomach    . FEXOFENADINE HCL 180 MG PO TABS Oral Take 180 mg by mouth daily as needed. For allergies    . NAPROXEN 500 MG PO TABS Oral Take 500 mg by mouth 2 (two) times daily as needed. For pain    . ROSUVASTATIN CALCIUM 5 MG PO TABS Oral Take 5 mg by mouth daily.        BP 112/71  Pulse 78  Temp(Src) 98.1 F (36.7 C) (Oral)  Resp 16  SpO2 94%  Physical Exam  Nursing note and vitals reviewed. Constitutional: He appears well-developed and well-nourished. He appears distressed.       Pt acutely uncomfortable appearing, constantly shifting positions  HENT:  Head: Normocephalic and atraumatic.  Eyes: EOM are normal. Pupils are equal, round, and reactive to light.  Neck: Normal range of motion.  Cardiovascular: Normal rate, regular rhythm and normal heart sounds.   Pulmonary/Chest: Effort normal and breath sounds normal. He exhibits no tenderness.  Abdominal: Soft. Bowel sounds are normal. He exhibits no distension and no mass. There is no tenderness. There is no rebound and no guarding.       R CVA tenderness  Genitourinary: Testes normal and penis normal.  Neurological: He is alert.  Skin: Skin is warm and dry. No rash noted. He is not diaphoretic.  Psychiatric: He has a normal mood  and affect.    ED Course  Procedures (including critical care time)  Labs Reviewed  URINALYSIS, ROUTINE W REFLEX MICROSCOPIC - Abnormal; Notable for the following:    APPearance CLOUDY (*)    Hgb urine dipstick LARGE (*)    All other components within normal limits  CBC - Abnormal; Notable for the following:    WBC 11.1 (*)    Platelets 149 (*)    All other components within normal limits  DIFFERENTIAL - Abnormal; Notable for the following:    Neutrophils Relative 80 (*)    Neutro Abs 8.9 (*)    Lymphocytes Relative 10 (*)    All other components within normal limits  BASIC METABOLIC PANEL - Abnormal; Notable for the following:    Glucose, Bld 124 (*)    GFR calc non Af Amer 67 (*)    GFR calc Af Amer 78 (*)    All other components within normal limits  URINE MICROSCOPIC-ADD ON   Ct Abdomen Pelvis Wo Contrast  05/11/2011  *RADIOLOGY REPORT*  Clinical Data: Right flank pain  CT ABDOMEN AND PELVIS WITHOUT CONTRAST  Technique:  Multidetector CT imaging of the abdomen and pelvis was performed following the standard protocol without intravenous contrast.  Comparison: None.  Findings: Lung bases are clear.  Liver is normal.  Spleen is normal.  Pancreas is normal.  The adrenal glands are normal.  Multiple bilateral renal calculi are identified.  The largest right renal stone is in the upper pole measuring 4.5 mm, image 33.  There is mild right hydronephrosis and hydroureter.  Within the urinary bladder at the right UVJ there is a stone measuring 2.5 mm, image 82.  Two tiny stones are identified within the tear pole of the left kidney.  These measure 2-3 mm.  No left hydronephrosis or hydroureter.  Gallbladder is unremarkable by CT.  No biliary ductal dilation.  Stomach and visualized large and small bowel are unremarkable.  Abdominal aorta normal is in caliber.  No significant lymphadenopathy.  No free fluid or abnormal fluid collections.  Appendix identified and normal.  Visualized colon and  small bowel are unremarkable.  No free fluid or abnormal fluid collections.  No significant lymphadenopathy.  Urinary bladder is normal.  IMPRESSION:  1.  Right UVJ stone measures 2.5 mm.  There is mild right hydronephrosis and hydroureter. 2.  Bilateral renal calculi.  Original Report Authenticated By: Rosealee Albee, M.D.     1. Nephrolithiasis       MDM  On initial eval, pt appears clinically c/w nephrolithiasis. His CT was + for R UVJ stone (I personally reviewed the imaging study). Mild hydronephrosis/ureter. +hematuria without evidence for UTI. Pt medicated with Dilaudid, Zofran and hydrated. Stated that his pain almost was completely resolved with this. Feel he is stable for dc home with analgesics,  antiemetics. Pt instructed to strain urine, make f/u appt with uro. He verbalized understanding and was agreeable with this plan. Return precautions discussed.        [place], [place] 05/11/11 [phone]

## 2011-05-11 NOTE — Discharge Instructions (Signed)
1) Your CT scan showed a kidney stone on the right side which is causing your pain. This appeared to be near the bladder. 2) Please drink plenty of fluids over the next several days and strain your urine to catch the stone when it passes. 3) Call [facility] to make a follow up. 4) Return to the ER with worsening pain, inability to urinate, blood in the urine, or other worrisome symptoms.  RESOURCE GUIDE  Dental Problems  Patients with Medicaid: [facility] [phone] [address]                                           [phone] [address] Phone:  [phone]                                                  Phone:  [phone]  If unable to pay or uninsured, contact:  [facility] or [facility]. to become qualified for the adult dental clinic.  Chronic Pain Problems Contact [facility]  [phone] Patients need to be referred by their primary care doctor.  Insufficient Money for Medicine Contact [employer] Way:  call "211" or [facility] ([phone].  No Primary Care Doctor Call [facility]  [phone] Other agencies that provide inexpensive medical care    [facility]  [phone]    [facility]  [phone]    [facility]  [phone]    [facility]  [phone]    [facility]  [phone]    [facility]  [phone]  Psychological Services [facility]  [phone] [facility] Services  [phone] [facility]   [phone] (emergency services [phone])  Substance Abuse Resources [facility]  [phone] [facility] [phone] [facility] [place] [phone] Floydene Flock [phone] Residential & Outpatient Substance Abuse Program  [phone]  Abuse/Neglect [facility] Child Abuse Hotline 907-764-2528 [facility] Child Abuse Hotline [phone] (After Hours)  Emergency  Shelter [facility] Ministries ([phone]  Maternity Homes Room at the [place] [facility] [place] [phone] [facility] [phone]  MRSA Hotline #:   269-386-5131    [facility] Resources  [facility]     [employer]                          [facility] Dept. [address] [place]                       [address]      [address]  Patrecia Pace  Michell Heinrich Phone:  [phone]                                   Phone:  [phone]                 Phone:  [phone]  [facility] Mental [facility] Phone:  [phone]  [facility] Child Abuse Hotline 712-173-2485 872-808-1616 (After Hours)  Kidney Stones Kidney stones (ureteral lithiasis) are deposits that form inside your kidneys. The intense pain is caused by the stone moving through the urinary tract. When the stone moves, the ureter goes into spasm around the stone. The stone is usually passed in the urine.  CAUSES   A disorder that makes certain neck glands produce too much parathyroid hormone (primary hyperparathyroidism).   A buildup of uric acid crystals.   Narrowing (stricture) of the ureter.   A kidney obstruction present at birth (congenital obstruction).   Previous surgery on the kidney or ureters.   Numerous kidney infections.  SYMPTOMS   Feeling sick to your stomach (nauseous).   Throwing up (vomiting).   Blood in the urine (hematuria).   Pain that usually spreads (radiates) to the groin.   Frequency or urgency of urination.  DIAGNOSIS   Taking a history and physical exam.   Blood or urine tests.   Computerized X-ray scan (CT scan).   Occasionally, an examination of the inside of the urinary bladder (cystoscopy) is performed.  TREATMENT   Observation.   Increasing your fluid intake.   Surgery may be needed if you have severe pain or  persistent obstruction.  The size, location, and chemical composition are all important variables that will determine the proper choice of action for you. Talk to your caregiver to better understand your situation so that you will minimize the risk of injury to yourself and your kidney.  HOME CARE INSTRUCTIONS   Drink enough water and fluids to keep your urine clear or pale yellow.   Strain all urine through the provided strainer. Keep all particulate matter and stones for your caregiver to see. The stone causing the pain may be as small as a grain of salt. It is very important to use the strainer each and every time you pass your urine. The collection of your stone will allow your caregiver to analyze it and verify that a stone has actually passed.   Only take over-the-counter or prescription medicines for pain, discomfort, or fever as directed by your caregiver.   Make a follow-up appointment with your caregiver as directed.   Get follow-up X-rays if required. The absence of pain does not always mean that the stone has passed. It may have only stopped moving. If the urine remains completely obstructed, it can cause loss of kidney function or even complete destruction of the kidney. It is your responsibility to make sure X-rays and follow-ups are completed. Ultrasounds of the kidney can show blockages and the status of the kidney. Ultrasounds are not associated with any radiation and can be performed easily in a matter of minutes.  SEEK IMMEDIATE MEDICAL CARE IF:   Pain cannot be controlled with the prescribed medicine.   You have a fever.   The severity or intensity of pain increases over 18 hours and is not relieved by pain medicine.   You develop a new onset of abdominal pain.   You feel faint or pass out.  MAKE SURE YOU:  Understand these instructions.   Will watch your condition.   Will get help right away if you are not doing well or get worse.  Document Released: 02/06/2005  Document Revised: 01/26/2011 Document Reviewed: 06/04/2009 [facility] Patient Information 2012 [place], [place].

## 2011-05-12 ENCOUNTER — Ambulatory Visit (INDEPENDENT_AMBULATORY_CARE_PROVIDER_SITE_OTHER): Payer: 59 | Admitting: Internal Medicine

## 2011-05-12 ENCOUNTER — Encounter: Payer: Self-pay | Admitting: Internal Medicine

## 2011-05-12 VITALS — BP 128/90 | HR 76 | Temp 97.4°F | Resp 16 | Wt 170.0 lb

## 2011-05-12 DIAGNOSIS — N200 Calculus of kidney: Secondary | ICD-10-CM | POA: Insufficient documentation

## 2011-05-12 DIAGNOSIS — Z862 Personal history of diseases of the blood and blood-forming organs and certain disorders involving the immune mechanism: Secondary | ICD-10-CM

## 2011-05-12 DIAGNOSIS — R39198 Other difficulties with micturition: Secondary | ICD-10-CM

## 2011-05-12 DIAGNOSIS — E785 Hyperlipidemia, unspecified: Secondary | ICD-10-CM

## 2011-05-12 DIAGNOSIS — K760 Fatty (change of) liver, not elsewhere classified: Secondary | ICD-10-CM

## 2011-05-12 DIAGNOSIS — K7689 Other specified diseases of liver: Secondary | ICD-10-CM

## 2011-05-12 DIAGNOSIS — Z8639 Personal history of other endocrine, nutritional and metabolic disease: Secondary | ICD-10-CM

## 2011-05-12 MED ORDER — KETOROLAC TROMETHAMINE 10 MG PO TABS: 10.0000 mg | ORAL_TABLET | Freq: Four times a day (QID) | ORAL | Status: AC | PRN

## 2011-05-12 NOTE — Assessment & Plan Note (Signed)
Improving; passed the stone last night

## 2011-05-12 NOTE — Assessment & Plan Note (Signed)
urol ref  

## 2011-05-12 NOTE — Progress Notes (Signed)
Patient ID: DAMEIN GAUNCE, male   DOB: Nov 17, 1965, 46 y.o.   MRN: [id number]  Subjective:    Patient ID: LOGUN COLAVITO, male    DOB: 1965/08/29, 46 y.o.   MRN: [id number]  HPI  C/o diarrhea x several  mo - no pattern - gas - better now He passed a stone the other night F/u BPH  Review of Systems  Constitutional: Negative for appetite change, fatigue and unexpected weight change.  HENT: Negative for nosebleeds, congestion, sore throat, sneezing, trouble swallowing and neck pain.   Eyes: Negative for itching and visual disturbance.  Respiratory: Negative for cough.   Cardiovascular: Negative for chest pain, palpitations and leg swelling.  Gastrointestinal: Negative for nausea, diarrhea, blood in stool and abdominal distention.  Genitourinary: Negative for frequency and hematuria.  Musculoskeletal: Negative for back pain, joint swelling and gait problem.  Skin: Negative for rash.  Neurological: Negative for dizziness, tremors, speech difficulty and weakness.  Psychiatric/Behavioral: Negative for sleep disturbance, dysphoric mood and agitation. The patient is not nervous/anxious.    . Wt Readings from Last 3 Encounters:  05/12/11 170 lb (77.111 kg)  02/06/11 164 lb (74.39 kg)  10/12/10 159 lb (72.122 kg)   BP Readings from Last 3 Encounters:  05/12/11 128/90  05/11/11 114/80  02/06/11 118/80         Objective:   Physical Exam  Constitutional: He is oriented to person, place, and time. He appears well-developed.  HENT:  Mouth/Throat: Oropharynx is clear and moist.  Eyes: Conjunctivae are normal. Pupils are equal, round, and reactive to light.  Neck: Normal range of motion. No JVD present. No thyromegaly present.  Cardiovascular: Normal rate, regular rhythm, normal heart sounds and intact distal pulses.  Exam reveals no gallop and no friction rub.   No murmur heard. Pulmonary/Chest: Effort normal and breath sounds normal. No respiratory distress. He has no wheezes. He  has no rales. He exhibits no tenderness.  Abdominal: Soft. Bowel sounds are normal. He exhibits no distension and no mass. There is no tenderness. There is no rebound and no guarding.  Musculoskeletal: Normal range of motion. He exhibits no edema and no tenderness.  Lymphadenopathy:    He has no cervical adenopathy.  Neurological: He is alert and oriented to person, place, and time. He has normal reflexes. No cranial nerve deficit. He exhibits normal muscle tone. Coordination normal.  Skin: Skin is warm and dry. No rash noted.  Psychiatric: He has a normal mood and affect. His behavior is normal. Judgment and thought content normal.   Lab Results  Component Value Date   WBC 11.1* 05/10/2011   HGB 14.1 05/10/2011   HCT 39.3 05/10/2011   PLT 149* 05/10/2011   GLUCOSE 124* 05/10/2011   CHOL 284* 10/12/2010   TRIG 227.0* 10/12/2010   HDL 49.80 10/12/2010   LDLDIRECT 177.7 10/12/2010   ALT 46 10/12/2010   AST 29 10/12/2010   NA 140 05/10/2011   K 3.8 05/10/2011   CL 104 05/10/2011   CREATININE 1.25 05/10/2011   BUN 12 05/10/2011   CO2 26 05/10/2011   TSH 3.33 01/05/2009   PSA 0.53 01/05/2009   HGBA1C 5.5 10/12/2010          Assessment & Plan:

## 2011-05-13 NOTE — Assessment & Plan Note (Signed)
Continue with current prescription therapy as reflected on the Med list.  

## 2011-05-13 NOTE — Assessment & Plan Note (Signed)
Cut back on carbs. Monitor CBGs Cont Crestor LFTs are ok

## 2011-05-13 NOTE — Assessment & Plan Note (Signed)
Cut back on carbs. Monitor CBGs Cont Crestor

## 2011-05-18 NOTE — ED Provider Notes (Signed)
Evaluation and management procedures were performed by the PA/NP under my supervision/collaboration.   Felisa Bonier, MD 05/18/11 1026

## 2011-08-02 ENCOUNTER — Telehealth: Payer: Self-pay | Admitting: *Deleted

## 2011-08-02 NOTE — Telephone Encounter (Signed)
Pt is requesting a copy of last lab results done here at office to compare to recent ones done at a health screening at work. Left message for pt to callback office to see if he would like labs results mailed or come to pick them up.

## 2011-08-02 NOTE — Telephone Encounter (Signed)
Labs mailed per pt request.

## 2011-11-13 ENCOUNTER — Encounter: Payer: Self-pay | Admitting: Internal Medicine

## 2011-11-13 ENCOUNTER — Ambulatory Visit (INDEPENDENT_AMBULATORY_CARE_PROVIDER_SITE_OTHER): Payer: 59 | Admitting: Internal Medicine

## 2011-11-13 VITALS — BP 126/70 | HR 81 | Temp 98.4°F | Wt 160.0 lb

## 2011-11-13 DIAGNOSIS — R209 Unspecified disturbances of skin sensation: Secondary | ICD-10-CM

## 2011-11-13 DIAGNOSIS — F341 Dysthymic disorder: Secondary | ICD-10-CM

## 2011-11-13 DIAGNOSIS — E785 Hyperlipidemia, unspecified: Secondary | ICD-10-CM

## 2011-11-13 DIAGNOSIS — G4700 Insomnia, unspecified: Secondary | ICD-10-CM

## 2011-11-13 DIAGNOSIS — F329 Major depressive disorder, single episode, unspecified: Secondary | ICD-10-CM

## 2011-11-13 DIAGNOSIS — R739 Hyperglycemia, unspecified: Secondary | ICD-10-CM

## 2011-11-13 DIAGNOSIS — K219 Gastro-esophageal reflux disease without esophagitis: Secondary | ICD-10-CM

## 2011-11-13 DIAGNOSIS — R7309 Other abnormal glucose: Secondary | ICD-10-CM

## 2011-11-13 DIAGNOSIS — Z23 Encounter for immunization: Secondary | ICD-10-CM

## 2011-11-13 DIAGNOSIS — R202 Paresthesia of skin: Secondary | ICD-10-CM

## 2011-11-13 MED ORDER — ZOLPIDEM TARTRATE 10 MG PO TABS: 310.0000 mg | ORAL_TABLET | Freq: Every evening | ORAL | Status: DC | PRN

## 2011-11-13 MED ORDER — VITAMIN D 1000 UNITS PO TABS: 1000.0000 [IU] | ORAL_TABLET | Freq: Every day | ORAL | Status: DC

## 2011-11-13 MED ORDER — BUPROPION HCL ER (SR) 150 MG PO TB12: 150.0000 mg | ORAL_TABLET | Freq: Two times a day (BID) | ORAL | Status: DC

## 2011-11-13 NOTE — Assessment & Plan Note (Signed)
Continue with current prescription therapy as reflected on the Med list.  

## 2011-11-13 NOTE — Progress Notes (Signed)
  Subjective:    Patient ID: Kyle Griffin, male    DOB: 1965/12/04, 46 y.o.   MRN: [id number]  HPI  C/o diarrhea x several  mo - no pattern - gas - better overall He passed a stone the other night F/u BPH  Review of Systems  Constitutional: Negative for appetite change, fatigue and unexpected weight change.  HENT: Negative for nosebleeds, congestion, sore throat, sneezing, trouble swallowing and neck pain.   Eyes: Negative for itching and visual disturbance.  Respiratory: Negative for cough.   Cardiovascular: Negative for chest pain, palpitations and leg swelling.  Gastrointestinal: Negative for nausea, diarrhea, blood in stool and abdominal distention.  Genitourinary: Negative for frequency and hematuria.  Musculoskeletal: Negative for back pain, joint swelling and gait problem.  Skin: Negative for rash.  Neurological: Negative for dizziness, tremors, speech difficulty and weakness.  Psychiatric/Behavioral: Positive for disturbed wake/sleep cycle. Negative for suicidal ideas, dysphoric mood and agitation. The patient is not nervous/anxious.    . Wt Readings from Last 3 Encounters:  11/13/11 160 lb (72.576 kg)  05/12/11 170 lb (77.111 kg)  02/06/11 164 lb (74.39 kg)   BP Readings from Last 3 Encounters:  11/13/11 126/70  05/12/11 128/90  05/11/11 114/80         Objective:   Physical Exam  Constitutional: He is oriented to person, place, and time. He appears well-developed.  HENT:  Mouth/Throat: Oropharynx is clear and moist.  Eyes: Conjunctivae normal are normal. Pupils are equal, round, and reactive to light.  Neck: Normal range of motion. No JVD present. No thyromegaly present.  Cardiovascular: Normal rate, regular rhythm, normal heart sounds and intact distal pulses.  Exam reveals no gallop and no friction rub.   No murmur heard. Pulmonary/Chest: Effort normal and breath sounds normal. No respiratory distress. He has no wheezes. He has no rales. He exhibits no  tenderness.  Abdominal: Soft. Bowel sounds are normal. He exhibits no distension and no mass. There is no tenderness. There is no rebound and no guarding.  Musculoskeletal: Normal range of motion. He exhibits no edema and no tenderness.  Lymphadenopathy:    He has no cervical adenopathy.  Neurological: He is alert and oriented to person, place, and time. He has normal reflexes. No cranial nerve deficit. He exhibits normal muscle tone. Coordination normal.  Skin: Skin is warm and dry. No rash noted.  Psychiatric: He has a normal mood and affect. His behavior is normal. Judgment and thought content normal.   Lab Results  Component Value Date   WBC 11.1* 05/10/2011   HGB 14.1 05/10/2011   HCT 39.3 05/10/2011   PLT 149* 05/10/2011   GLUCOSE 124* 05/10/2011   CHOL 284* 10/12/2010   TRIG 227.0* 10/12/2010   HDL 49.80 10/12/2010   LDLDIRECT 177.7 10/12/2010   ALT 46 10/12/2010   AST 29 10/12/2010   NA 140 05/10/2011   K 3.8 05/10/2011   CL 104 05/10/2011   CREATININE 1.25 05/10/2011   BUN 12 05/10/2011   CO2 26 05/10/2011   TSH 3.33 01/05/2009   PSA 0.53 01/05/2009   HGBA1C 5.5 10/12/2010          Assessment & Plan:

## 2011-11-14 ENCOUNTER — Encounter: Payer: Self-pay | Admitting: Internal Medicine

## 2011-11-14 ENCOUNTER — Telehealth: Payer: Self-pay | Admitting: Internal Medicine

## 2011-11-14 ENCOUNTER — Other Ambulatory Visit (INDEPENDENT_AMBULATORY_CARE_PROVIDER_SITE_OTHER): Payer: 59

## 2011-11-14 DIAGNOSIS — R7309 Other abnormal glucose: Secondary | ICD-10-CM

## 2011-11-14 DIAGNOSIS — G4700 Insomnia, unspecified: Secondary | ICD-10-CM | POA: Insufficient documentation

## 2011-11-14 DIAGNOSIS — F329 Major depressive disorder, single episode, unspecified: Secondary | ICD-10-CM

## 2011-11-14 DIAGNOSIS — R202 Paresthesia of skin: Secondary | ICD-10-CM

## 2011-11-14 DIAGNOSIS — Z23 Encounter for immunization: Secondary | ICD-10-CM

## 2011-11-14 DIAGNOSIS — R209 Unspecified disturbances of skin sensation: Secondary | ICD-10-CM

## 2011-11-14 DIAGNOSIS — K219 Gastro-esophageal reflux disease without esophagitis: Secondary | ICD-10-CM

## 2011-11-14 DIAGNOSIS — E785 Hyperlipidemia, unspecified: Secondary | ICD-10-CM

## 2011-11-14 DIAGNOSIS — R739 Hyperglycemia, unspecified: Secondary | ICD-10-CM

## 2011-11-14 DIAGNOSIS — F341 Dysthymic disorder: Secondary | ICD-10-CM

## 2011-11-14 LAB — VITAMIN B12: Vitamin B-12: 351 pg/mL (ref 211–911)

## 2011-11-14 LAB — LDL CHOLESTEROL, DIRECT: Direct LDL: 203.3 mg/dL

## 2011-11-14 LAB — BASIC METABOLIC PANEL
BUN: 18 mg/dL (ref 6–23)
CO2: 26 mEq/L (ref 19–32)
Calcium: 9.3 mg/dL (ref 8.4–10.5)
Chloride: 104 mEq/L (ref 96–112)
Creatinine, Ser: 1.2 mg/dL (ref 0.4–1.5)
GFR: 68.63 mL/min (ref >60.00)
Glucose, Bld: 95 mg/dL (ref 70–99)
Potassium: 4.4 mEq/L (ref 3.5–5.1)
Sodium: 142 mEq/L (ref 135–145)

## 2011-11-14 LAB — LIPID PANEL
Cholesterol: 279 mg/dL — ABNORMAL HIGH (ref 0–200)
HDL: 41.7 mg/dL (ref >39.00)
Total CHOL/HDL Ratio: 7
Triglycerides: 164 mg/dL — ABNORMAL HIGH (ref 0.0–149.0)
VLDL: 32.8 mg/dL (ref 0.0–40.0)

## 2011-11-14 LAB — HEPATIC FUNCTION PANEL
ALT: 31 U/L (ref 0–53)
AST: 29 U/L (ref 0–37)
Albumin: 4.1 g/dL (ref 3.5–5.2)
Alkaline Phosphatase: 52 U/L (ref 39–117)
Bilirubin, Direct: 0.3 mg/dL (ref 0.0–0.3)
Total Bilirubin: 3.6 mg/dL — ABNORMAL HIGH (ref 0.3–1.2)
Total Protein: 6.9 g/dL (ref 6.0–8.3)

## 2011-11-14 LAB — TSH: TSH: 4.33 u[IU]/mL (ref 0.35–5.50)

## 2011-11-14 NOTE — Assessment & Plan Note (Signed)
Start Wellbutrin Family councelling

## 2011-11-14 NOTE — Telephone Encounter (Signed)
Kyle Griffin, please, inform patient that all labs are normal except for elev cholesterol  Please, mail the labs to the patient.    Thx

## 2011-11-14 NOTE — Assessment & Plan Note (Signed)
Zolpidem prn 

## 2011-11-15 NOTE — Telephone Encounter (Signed)
Left detailed mess informing pt of below.  

## 2011-12-23 ENCOUNTER — Emergency Department (HOSPITAL_COMMUNITY)
Admission: EM | Admit: 2011-12-23 | Discharge: 2011-12-23 | Disposition: A | Discharge status: Home / Self Care | Payer: 59 | Attending: Emergency Medicine | Admitting: Emergency Medicine

## 2011-12-23 ENCOUNTER — Encounter (HOSPITAL_COMMUNITY): Payer: Self-pay | Admitting: Emergency Medicine

## 2011-12-23 DIAGNOSIS — J45909 Unspecified asthma, uncomplicated: Secondary | ICD-10-CM | POA: Insufficient documentation

## 2011-12-23 DIAGNOSIS — N200 Calculus of kidney: Secondary | ICD-10-CM | POA: Insufficient documentation

## 2011-12-23 DIAGNOSIS — J309 Allergic rhinitis, unspecified: Secondary | ICD-10-CM | POA: Insufficient documentation

## 2011-12-23 DIAGNOSIS — K589 Irritable bowel syndrome without diarrhea: Secondary | ICD-10-CM | POA: Insufficient documentation

## 2011-12-23 DIAGNOSIS — R110 Nausea: Secondary | ICD-10-CM | POA: Insufficient documentation

## 2011-12-23 DIAGNOSIS — K219 Gastro-esophageal reflux disease without esophagitis: Secondary | ICD-10-CM | POA: Insufficient documentation

## 2011-12-23 DIAGNOSIS — Z87891 Personal history of nicotine dependence: Secondary | ICD-10-CM | POA: Insufficient documentation

## 2011-12-23 DIAGNOSIS — Z79899 Other long term (current) drug therapy: Secondary | ICD-10-CM | POA: Insufficient documentation

## 2011-12-23 DIAGNOSIS — R319 Hematuria, unspecified: Secondary | ICD-10-CM | POA: Insufficient documentation

## 2011-12-23 DIAGNOSIS — E785 Hyperlipidemia, unspecified: Secondary | ICD-10-CM | POA: Insufficient documentation

## 2011-12-23 LAB — POCT I-STAT, CHEM 8
BUN: 21 mg/dL (ref 6–23)
Calcium, Ion: 1.14 mmol/L (ref 1.12–1.23)
Chloride: 106 mEq/L (ref 96–112)
Creatinine, Ser: 1.5 mg/dL — ABNORMAL HIGH (ref 0.50–1.35)
Glucose, Bld: 150 mg/dL — ABNORMAL HIGH (ref 70–99)
HCT: 42 % (ref 39.0–52.0)
Hemoglobin: 14.3 g/dL (ref 13.0–17.0)
Potassium: 3.8 mEq/L (ref 3.5–5.1)
Sodium: 139 mEq/L (ref 135–145)
TCO2: 20 mmol/L (ref 0–100)

## 2011-12-23 LAB — URINALYSIS, ROUTINE W REFLEX MICROSCOPIC
Bilirubin Urine: NEGATIVE
Glucose, UA: NEGATIVE mg/dL
Nitrite: NEGATIVE
Protein, ur: 30 mg/dL — AB
Specific Gravity, Urine: 1.027 (ref 1.005–1.030)
Urobilinogen, UA: 1 mg/dL (ref 0.0–1.0)
pH: 7 (ref 5.0–8.0)

## 2011-12-23 LAB — URINE MICROSCOPIC-ADD ON

## 2011-12-23 MED ORDER — ONDANSETRON HCL 4 MG/2ML IJ SOLN
4.0000 mg | Freq: Once | INTRAMUSCULAR | Status: AC
  Administered 2011-12-23: 4 mg via INTRAVENOUS
  Filled 2011-12-23: qty 2

## 2011-12-23 MED ORDER — TAMSULOSIN HCL 0.4 MG PO CAPS: 0.4000 mg | ORAL_CAPSULE | Freq: Every day | ORAL | Status: DC

## 2011-12-23 MED ORDER — ONDANSETRON HCL 8 MG PO TABS: 8.0000 mg | ORAL_TABLET | Freq: Three times a day (TID) | ORAL | Status: DC | PRN

## 2011-12-23 MED ORDER — KETOROLAC TROMETHAMINE 30 MG/ML IJ SOLN
30.0000 mg | Freq: Once | INTRAMUSCULAR | Status: AC
  Administered 2011-12-23: 30 mg via INTRAVENOUS
  Filled 2011-12-23: qty 1

## 2011-12-23 MED ORDER — HYDROMORPHONE HCL PF 1 MG/ML IJ SOLN
1.0000 mg | Freq: Once | INTRAMUSCULAR | Status: AC
  Administered 2011-12-23: 1 mg via INTRAVENOUS
  Filled 2011-12-23: qty 1

## 2011-12-23 MED ORDER — SODIUM CHLORIDE 0.9 % IV BOLUS (SEPSIS)
1000.0000 mL | Freq: Once | INTRAVENOUS | Status: AC
  Administered 2011-12-23: 1000 mL via INTRAVENOUS

## 2011-12-23 MED ORDER — HYDROMORPHONE HCL 2 MG PO TABS: 2.0000 mg | ORAL_TABLET | ORAL | Status: DC | PRN

## 2011-12-23 NOTE — ED Provider Notes (Signed)
History     CSN: [id number]  Arrival date & time 12/23/11  1951   First MD Initiated Contact with Patient 12/23/11 2001      Chief Complaint  Patient presents with  . Flank Pain    (Consider location/radiation/quality/duration/timing/severity/associated sxs/prior treatment) HPI History provided by pt.   Pt c/o severe, constant, right flank, suprapubic and groin pain since 11:30am today.  Associated w/ nausea and hematuria.  Denies fever, change in bowels and other GU sx.  No relief w/ vicodin.  Has had several kidney stones in the past, most recently in 04/2011, and current sx are typical.    Past Medical History  Diagnosis Date  . Hyperlipidemia   . GERD (gastroesophageal reflux disease)   . Nephrolithiasis   . IBS (irritable bowel syndrome)   . Allergic rhinitis   . Asthma, exercise induced   . Gilbert disease   . Renal stones     Past Surgical History  Procedure Date  . Rotator cuff repair   . Foot surgery     Cyst, Heel    Family History  Problem Relation Age of Onset  . Coronary artery disease Mother   . Stroke Mother   . Heart disease Mother   . Hyperlipidemia Mother   . Cancer Mother 65    lung ca  . Heart disease Father   . Hyperlipidemia Father   . Diabetes Neg Hx     History  Substance Use Topics  . Smoking status: Former Smoker    Quit date: 12/23/1999  . Smokeless tobacco: Not on file  . Alcohol Use: No      Review of Systems  All other systems reviewed and are negative.    Allergies  Oxycodone-acetaminophen  Home Medications   Current Outpatient Rx  Name Route Sig Dispense Refill  . ALBUTEROL SULFATE HFA 108 (90 BASE) MCG/ACT IN AERS Inhalation Inhale 2 puffs into the lungs 4 (four) times daily as needed. For wheezing    . BUPROPION HCL ER (SR) 150 MG PO TB12 Oral Take 1 tablet (150 mg total) by mouth 2 (two) times daily. 30 tablet 5  . VITAMIN D 1000 UNITS PO TABS Oral Take 1 tablet (1,000 Units total) by mouth daily. 100 tablet 3   . ESOMEPRAZOLE MAGNESIUM 40 MG PO CPDR Oral Take 40 mg by mouth daily as needed. For upset stomach    . FEXOFENADINE HCL 180 MG PO TABS Oral Take 180 mg by mouth daily as needed. For allergies    . NAPROXEN 500 MG PO TABS Oral Take 500 mg by mouth 2 (two) times daily as needed. For pain    . ROSUVASTATIN CALCIUM 5 MG PO TABS Oral Take 5 mg by mouth daily.      Marland Kitchen ZOLPIDEM TARTRATE 10 MG PO TABS Oral Take 31 tablets (310 mg total) by mouth at bedtime as needed for sleep. 30 tablet 1    BP 140/81  Pulse 74  Temp 98.1 F (36.7 C) (Oral)  Resp 18  SpO2 96%  Physical Exam  Nursing note and vitals reviewed. Constitutional: He is oriented to person, place, and time. He appears well-developed and well-nourished. No distress.       Pt appears very uncomfortable  HENT:  Head: Normocephalic and atraumatic.  Eyes:       Normal appearance  Neck: Normal range of motion.  Cardiovascular: Normal rate and regular rhythm.   Pulmonary/Chest: Effort normal and breath sounds normal. No respiratory distress.  Abdominal: Soft.  Bowel sounds are normal. He exhibits no distension and no mass. There is no rebound and no guarding.       Diffuse right-sided as well as suprapubic ttp  Genitourinary:       No CVA tenderness  Musculoskeletal: Normal range of motion.  Neurological: He is alert and oriented to person, place, and time.  Skin: Skin is warm and dry. No rash noted.  Psychiatric: He has a normal mood and affect. His behavior is normal.    ED Course  Procedures (including critical care time)  Labs Reviewed  URINALYSIS, ROUTINE W REFLEX MICROSCOPIC - Abnormal; Notable for the following:    APPearance CLOUDY (*)     Hgb urine dipstick LARGE (*)     Ketones, ur TRACE (*)     Protein, ur 30 (*)     Leukocytes, UA SMALL (*)     All other components within normal limits  POCT I-STAT, CHEM 8 - Abnormal; Notable for the following:    Creatinine, Ser 1.50 (*)     Glucose, Bld 150 (*)     All other  components within normal limits  URINE MICROSCOPIC-ADD ON   No results found.   1. Kidney stone on right side       MDM  46yo M w/ h/o several kidney stones presents with R flank/suprapubic/groin pain, hematuria and nausea, all sx consistent w/ past stones (no indication for CT scan today).  Afebrile, uncomfortable appearing, right-sided abd and suprapubic ttp on exam.  U/A and Cr pending.  IV NS, toradol, dilaudid and zofran ordered for pain.  Will reassess shortly.  8:23 PM   Pt reports that pain is much improved.  Labs sig for Cr 1.5.  Will po challenge and then advise close f/u with his urologist (Dr. Brunilda Payor).  Prescribed fifteen 2mg  dilaudid (allergic to oxycodone, no relief w/ vicodin, pt reasonable.) Return precautions discussed.  10:33 PM         Otilio Miu, PA 12/23/11 2234

## 2011-12-23 NOTE — ED Notes (Signed)
Per Pt's wife, he was hx or kidney stones and began having dull flank pain this morning that has progressed significantly. Pt is in obvious distress. Pt took 2 Vicodin at 1730, which is doing little to help the pain.

## 2011-12-23 NOTE — ED Notes (Signed)
Pt states that he had last kidney stone in March. Pt states he has blood in his urine and nausea but denies any vomiting. States pain radiates from his back to his groin and all around.

## 2011-12-23 NOTE — ED Notes (Signed)
Sprite given 

## 2011-12-24 NOTE — ED Provider Notes (Signed)
Medical screening examination/treatment/procedure(s) were performed by non-physician practitioner and as supervising physician I was immediately available for consultation/collaboration.  Hurman Horn, MD 12/24/11 [phone]

## 2011-12-25 ENCOUNTER — Telehealth: Payer: Self-pay

## 2011-12-25 NOTE — Telephone Encounter (Signed)
[facility] Triage Call Report Triage Record Num: [id number] Operator: Remonia Richter Patient Name: Kyle Griffin Call Date & Time: 12/23/2011 5:23:28PM Patient Phone: [phone] PCP: Sonda Primes Patient Gender: Male PCP Fax : [phone] Patient DOB: [birth year] Practice Name: [facility] Reason for Call: Caller: Beth/Spouse; PCP: Sonda Primes (Adults only); CB#: [phone]; Call regarding Urinary bleeding with left flank pain, he has taken Toredol and not effective for the pain described as unbearable, he has history of Kidney stones and known to have 4 more in there", ER disposition obtained per Flank Pain Guideline due to unbearable pain,wife informed and she stated she was not able to take him due to having her grandchild to watch, she was told pain medications could not be called in after hours, he would need to be seen for eval of pain, she voiced understanding Protocol(s) Used: Flank Pain Recommended Outcome per Protocol: See ED Immediately Reason for Outcome: Unbearable abdominal/pelvic pain Care Advice: ~ IMMEDIATE ACTION 12/23/2011 5:38:02PM Page 1 of 1 [email or web address]

## 2011-12-25 NOTE — Telephone Encounter (Signed)
Agree. Thx 

## 2012-02-23 ENCOUNTER — Ambulatory Visit (INDEPENDENT_AMBULATORY_CARE_PROVIDER_SITE_OTHER): Payer: 59 | Admitting: Internal Medicine

## 2012-02-23 ENCOUNTER — Encounter: Payer: Self-pay | Admitting: Internal Medicine

## 2012-02-23 VITALS — BP 132/80 | HR 80 | Temp 98.7°F | Resp 16 | Wt 165.0 lb

## 2012-02-23 DIAGNOSIS — F341 Dysthymic disorder: Secondary | ICD-10-CM

## 2012-02-23 DIAGNOSIS — Z862 Personal history of diseases of the blood and blood-forming organs and certain disorders involving the immune mechanism: Secondary | ICD-10-CM

## 2012-02-23 DIAGNOSIS — K760 Fatty (change of) liver, not elsewhere classified: Secondary | ICD-10-CM

## 2012-02-23 DIAGNOSIS — Z8639 Personal history of other endocrine, nutritional and metabolic disease: Secondary | ICD-10-CM

## 2012-02-23 DIAGNOSIS — K7689 Other specified diseases of liver: Secondary | ICD-10-CM

## 2012-02-23 DIAGNOSIS — G4700 Insomnia, unspecified: Secondary | ICD-10-CM

## 2012-02-23 DIAGNOSIS — F329 Major depressive disorder, single episode, unspecified: Secondary | ICD-10-CM

## 2012-02-23 DIAGNOSIS — N200 Calculus of kidney: Secondary | ICD-10-CM

## 2012-02-23 MED ORDER — HYDROMORPHONE HCL 2 MG PO TABS: 2.0000 mg | ORAL_TABLET | ORAL | Status: DC | PRN

## 2012-02-23 MED ORDER — KETOROLAC TROMETHAMINE 10 MG PO TABS: 10.0000 mg | ORAL_TABLET | Freq: Four times a day (QID) | ORAL | Status: DC | PRN

## 2012-02-23 NOTE — Assessment & Plan Note (Signed)
Better  

## 2012-02-23 NOTE — Assessment & Plan Note (Signed)
Better  Not on wellbutrin

## 2012-02-23 NOTE — Progress Notes (Signed)
   Subjective:    HPI  C/o diarrhea x several  mo - no pattern - gas - better overall C/o stress - mother just passed away F/u BPH  Review of Systems  Constitutional: Negative for appetite change, fatigue and unexpected weight change.  HENT: Negative for nosebleeds, congestion, sore throat, sneezing, trouble swallowing and neck pain.   Eyes: Negative for itching and visual disturbance.  Respiratory: Negative for cough.   Cardiovascular: Negative for chest pain, palpitations and leg swelling.  Gastrointestinal: Negative for nausea, diarrhea, blood in stool and abdominal distention.  Genitourinary: Negative for frequency and hematuria.  Musculoskeletal: Negative for back pain, joint swelling and gait problem.  Skin: Negative for rash.  Neurological: Negative for dizziness, tremors, speech difficulty and weakness.  Psychiatric/Behavioral: Positive for sleep disturbance. Negative for suicidal ideas, dysphoric mood and agitation. The patient is not nervous/anxious.    . Wt Readings from Last 3 Encounters:  02/23/12 165 lb (74.844 kg)  11/13/11 160 lb (72.576 kg)  05/12/11 170 lb (77.111 kg)   BP Readings from Last 3 Encounters:  02/23/12 132/80  12/23/11 121/69  11/13/11 126/70         Objective:   Physical Exam  Constitutional: He is oriented to person, place, and time. He appears well-developed.  HENT:  Mouth/Throat: Oropharynx is clear and moist.  Eyes: Conjunctivae normal are normal. Pupils are equal, round, and reactive to light.  Neck: Normal range of motion. No JVD present. No thyromegaly present.  Cardiovascular: Normal rate, regular rhythm, normal heart sounds and intact distal pulses.  Exam reveals no gallop and no friction rub.   No murmur heard. Pulmonary/Chest: Effort normal and breath sounds normal. No respiratory distress. He has no wheezes. He has no rales. He exhibits no tenderness.  Abdominal: Soft. Bowel sounds are normal. He exhibits no distension and no  mass. There is no tenderness. There is no rebound and no guarding.  Musculoskeletal: Normal range of motion. He exhibits no edema and no tenderness.  Lymphadenopathy:    He has no cervical adenopathy.  Neurological: He is alert and oriented to person, place, and time. He has normal reflexes. No cranial nerve deficit. He exhibits normal muscle tone. Coordination normal.  Skin: Skin is warm and dry. No rash noted.  Psychiatric: He has a normal mood and affect. His behavior is normal. Judgment and thought content normal.   Lab Results  Component Value Date   WBC 11.1* 05/10/2011   HGB 14.3 12/23/2011   HCT 42.0 12/23/2011   PLT 149* 05/10/2011   GLUCOSE 150* 12/23/2011   CHOL 279* 11/14/2011   TRIG 164.0* 11/14/2011   HDL 41.70 11/14/2011   LDLDIRECT 203.3 11/14/2011   ALT 31 11/14/2011   AST 29 11/14/2011   NA 139 12/23/2011   K 3.8 12/23/2011   CL 106 12/23/2011   CREATININE 1.50* 12/23/2011   BUN 21 12/23/2011   CO2 26 11/14/2011   TSH 4.33 11/14/2011   PSA 0.53 01/05/2009   HGBA1C 5.5 10/12/2010          Assessment & Plan:

## 2012-02-25 NOTE — Assessment & Plan Note (Signed)
Labs next time 

## 2012-02-25 NOTE — Assessment & Plan Note (Signed)
Vicodin/Toradol if relapsed

## 2012-02-25 NOTE — Assessment & Plan Note (Signed)
Labs next ime

## 2012-04-10 DIAGNOSIS — R070 Pain in throat: Secondary | ICD-10-CM | POA: Insufficient documentation

## 2012-09-10 ENCOUNTER — Other Ambulatory Visit: Payer: Self-pay | Admitting: *Deleted

## 2012-09-10 ENCOUNTER — Other Ambulatory Visit (INDEPENDENT_AMBULATORY_CARE_PROVIDER_SITE_OTHER): Payer: BC Managed Care – PPO

## 2012-09-10 DIAGNOSIS — Z125 Encounter for screening for malignant neoplasm of prostate: Secondary | ICD-10-CM

## 2012-09-10 DIAGNOSIS — R7989 Other specified abnormal findings of blood chemistry: Secondary | ICD-10-CM

## 2012-09-10 DIAGNOSIS — Z0000 Encounter for general adult medical examination without abnormal findings: Secondary | ICD-10-CM

## 2012-09-10 LAB — HEPATIC FUNCTION PANEL
ALT: 29 U/L (ref 0–53)
AST: 24 U/L (ref 0–37)
Albumin: 4.2 g/dL (ref 3.5–5.2)
Alkaline Phosphatase: 59 U/L (ref 39–117)
Bilirubin, Direct: 0.2 mg/dL (ref 0.0–0.3)
Total Bilirubin: 2.6 mg/dL — ABNORMAL HIGH (ref 0.3–1.2)
Total Protein: 7 g/dL (ref 6.0–8.3)

## 2012-09-10 LAB — CBC WITH DIFFERENTIAL/PLATELET
Basophils Absolute: 0 10*3/uL (ref 0.0–0.1)
Basophils Relative: 0.9 % (ref 0.0–3.0)
Eosinophils Absolute: 0.3 10*3/uL (ref 0.0–0.7)
Eosinophils Relative: 7.4 % — ABNORMAL HIGH (ref 0.0–5.0)
HCT: 47.2 % (ref 39.0–52.0)
Hemoglobin: 16 g/dL (ref 13.0–17.0)
Lymphocytes Relative: 31.1 % (ref 12.0–46.0)
Lymphs Abs: 1.4 10*3/uL (ref 0.7–4.0)
MCHC: 34 g/dL (ref 30.0–36.0)
MCV: 89.8 fl (ref 78.0–100.0)
Monocytes Absolute: 0.4 10*3/uL (ref 0.1–1.0)
Monocytes Relative: 9.9 % (ref 3.0–12.0)
Neutro Abs: 2.2 10*3/uL (ref 1.4–7.7)
Neutrophils Relative %: 50.7 % (ref 43.0–77.0)
Platelets: 189 10*3/uL (ref 150.0–400.0)
RBC: 5.26 Mil/uL (ref 4.22–5.81)
RDW: 12.7 % (ref 11.5–14.6)
WBC: 4.4 10*3/uL — ABNORMAL LOW (ref 4.5–10.5)

## 2012-09-10 LAB — LIPID PANEL
Cholesterol: 304 mg/dL — ABNORMAL HIGH (ref 0–200)
HDL: 43.9 mg/dL (ref >39.00)
Total CHOL/HDL Ratio: 7
Triglycerides: 231 mg/dL — ABNORMAL HIGH (ref 0.0–149.0)
VLDL: 46.2 mg/dL — ABNORMAL HIGH (ref 0.0–40.0)

## 2012-09-10 LAB — BASIC METABOLIC PANEL
BUN: 15 mg/dL (ref 6–23)
CO2: 28 mEq/L (ref 19–32)
Calcium: 9.4 mg/dL (ref 8.4–10.5)
Chloride: 104 mEq/L (ref 96–112)
Creatinine, Ser: 1.1 mg/dL (ref 0.4–1.5)
GFR: 74.07 mL/min (ref >60.00)
Glucose, Bld: 84 mg/dL (ref 70–99)
Potassium: 4.3 mEq/L (ref 3.5–5.1)
Sodium: 139 mEq/L (ref 135–145)

## 2012-09-10 LAB — URINALYSIS, ROUTINE W REFLEX MICROSCOPIC
Bilirubin Urine: NEGATIVE
Hgb urine dipstick: NEGATIVE
Ketones, ur: NEGATIVE
Leukocytes, UA: NEGATIVE
Nitrite: NEGATIVE
Specific Gravity, Urine: 1.025 (ref 1.000–1.030)
Total Protein, Urine: NEGATIVE
Urine Glucose: NEGATIVE
Urobilinogen, UA: 0.2 (ref 0.0–1.0)
pH: 6 (ref 5.0–8.0)

## 2012-09-10 LAB — PSA: PSA: 0.54 ng/mL (ref 0.10–4.00)

## 2012-09-10 LAB — TSH: TSH: 4.18 u[IU]/mL (ref 0.35–5.50)

## 2012-09-10 LAB — LDL CHOLESTEROL, DIRECT: Direct LDL: 203.5 mg/dL

## 2012-09-16 ENCOUNTER — Ambulatory Visit (INDEPENDENT_AMBULATORY_CARE_PROVIDER_SITE_OTHER): Payer: BC Managed Care – PPO | Admitting: Internal Medicine

## 2012-09-16 ENCOUNTER — Encounter: Payer: Self-pay | Admitting: Internal Medicine

## 2012-09-16 VITALS — BP 110/88 | HR 76 | Temp 97.8°F | Resp 16 | Ht 65.0 in | Wt 164.0 lb

## 2012-09-16 DIAGNOSIS — E785 Hyperlipidemia, unspecified: Secondary | ICD-10-CM

## 2012-09-16 DIAGNOSIS — Z23 Encounter for immunization: Secondary | ICD-10-CM

## 2012-09-16 DIAGNOSIS — Z0000 Encounter for general adult medical examination without abnormal findings: Secondary | ICD-10-CM | POA: Insufficient documentation

## 2012-09-16 MED ORDER — ZOLPIDEM TARTRATE 10 MG PO TABS: 10.0000 mg | ORAL_TABLET | Freq: Every evening | ORAL | Status: DC | PRN

## 2012-09-16 MED ORDER — ROSUVASTATIN CALCIUM 5 MG PO TABS: 5.0000 mg | ORAL_TABLET | Freq: Every day | ORAL | Status: DC

## 2012-09-16 NOTE — Assessment & Plan Note (Signed)
We discussed age appropriate health related issues, including available/recomended screening tests and vaccinations. We discussed a need for adhering to healthy diet and exercise. Labs/EKG were reviewed/ordered. All questions were answered.   

## 2012-09-16 NOTE — Progress Notes (Signed)
   Subjective:    HPI The patient is here for a wellness exam. The patient has been doing well overall without major physical or psychological issues going on lately. The patient needs to address  chronic  chronic  hyperlipidemia    F/u stress - mother  passed away in 12-30-13F/u BPH  Review of Systems  Constitutional: Negative for appetite change, fatigue and unexpected weight change.  HENT: Negative for nosebleeds, congestion, sore throat, sneezing, trouble swallowing and neck pain.   Eyes: Negative for itching and visual disturbance.  Respiratory: Negative for cough.   Cardiovascular: Negative for chest pain, palpitations and leg swelling.  Gastrointestinal: Negative for nausea, diarrhea, blood in stool and abdominal distention.  Genitourinary: Negative for frequency and hematuria.  Musculoskeletal: Negative for back pain, joint swelling and gait problem.  Skin: Negative for rash.  Neurological: Negative for dizziness, tremors, speech difficulty and weakness.  Psychiatric/Behavioral: Positive for sleep disturbance. Negative for suicidal ideas, dysphoric mood and agitation. The patient is not nervous/anxious.    . Wt Readings from Last 3 Encounters:  09/16/12 164 lb (74.39 kg)  02/23/12 165 lb (74.844 kg)  11/13/11 160 lb (72.576 kg)   BP Readings from Last 3 Encounters:  09/16/12 110/88  02/23/12 132/80  12/23/11 121/69         Objective:   Physical Exam  Constitutional: He is oriented to person, place, and time. He appears well-developed.  HENT:  Mouth/Throat: Oropharynx is clear and moist.  Eyes: Conjunctivae are normal. Pupils are equal, round, and reactive to light.  Neck: Normal range of motion. No JVD present. No thyromegaly present.  Cardiovascular: Normal rate, regular rhythm, normal heart sounds and intact distal pulses.  Exam reveals no gallop and no friction rub.   No murmur heard. Pulmonary/Chest: Effort normal and breath sounds normal. No respiratory  distress. He has no wheezes. He has no rales. He exhibits no tenderness.  Abdominal: Soft. Bowel sounds are normal. He exhibits no distension and no mass. There is no tenderness. There is no rebound and no guarding.  Musculoskeletal: Normal range of motion. He exhibits no edema and no tenderness.  Lymphadenopathy:    He has no cervical adenopathy.  Neurological: He is alert and oriented to person, place, and time. He has normal reflexes. No cranial nerve deficit. He exhibits normal muscle tone. Coordination normal.  Skin: Skin is warm and dry. No rash noted.  Psychiatric: He has a normal mood and affect. His behavior is normal. Judgment and thought content normal.   Lab Results  Component Value Date   WBC 4.4* 09/10/2012   HGB 16.0 09/10/2012   HCT 47.2 09/10/2012   PLT 189.0 09/10/2012   GLUCOSE 84 09/10/2012   CHOL 304* 09/10/2012   TRIG 231.0* 09/10/2012   HDL 43.90 09/10/2012   LDLDIRECT 203.5 09/10/2012   ALT 29 09/10/2012   AST 24 09/10/2012   NA 139 09/10/2012   K 4.3 09/10/2012   CL 104 09/10/2012   CREATININE 1.1 09/10/2012   BUN 15 09/10/2012   CO2 28 09/10/2012   TSH 4.18 09/10/2012   PSA 0.54 09/10/2012   HGBA1C 5.5 10/12/2010          Assessment & Plan:

## 2012-09-16 NOTE — Assessment & Plan Note (Signed)
Risks associated with treatment noncompliance were discussed. Compliance was encouraged. Re-start  Crestor NMR in 4 mo

## 2012-10-01 ENCOUNTER — Encounter: Payer: Self-pay | Admitting: Internal Medicine

## 2012-10-01 ENCOUNTER — Ambulatory Visit: Payer: BC Managed Care – PPO | Admitting: Internal Medicine

## 2012-10-01 ENCOUNTER — Ambulatory Visit (INDEPENDENT_AMBULATORY_CARE_PROVIDER_SITE_OTHER): Payer: BC Managed Care – PPO | Admitting: Internal Medicine

## 2012-10-01 VITALS — BP 110/88 | HR 78 | Temp 97.8°F | Resp 16 | Wt 165.0 lb

## 2012-10-01 DIAGNOSIS — R1013 Epigastric pain: Secondary | ICD-10-CM

## 2012-10-01 NOTE — Progress Notes (Signed)
Subjective:    Abdominal Pain This is a new problem. The current episode started in the past 7 days. The onset quality is gradual. The problem occurs intermittently. The problem has been waxing and waning. The pain is located in the epigastric region. The pain is at a severity of 4/10. The pain is moderate. The quality of the pain is cramping. The abdominal pain does not radiate. Associated symptoms include belching. Pertinent negatives include no diarrhea, frequency, hematuria, nausea, vomiting or weight loss. Nothing aggravates the pain. The pain is relieved by nothing. He has tried H2 blockers and antacids for the symptoms. The treatment provided mild relief. There is no history of abdominal surgery.  He started Crestor and ASA 2 wks ado  The patient needs to address  chronic  chronic  hyperlipidemia    F/u stress - mother  passed away in 04-Jan-2014F/u BPH  Review of Systems  Constitutional: Negative for weight loss, appetite change, fatigue and unexpected weight change.  HENT: Negative for nosebleeds, congestion, sore throat, sneezing, trouble swallowing and neck pain.   Eyes: Negative for itching and visual disturbance.  Respiratory: Negative for cough.   Cardiovascular: Negative for chest pain, palpitations and leg swelling.  Gastrointestinal: Positive for abdominal pain. Negative for nausea, vomiting, diarrhea, blood in stool and abdominal distention.  Genitourinary: Negative for frequency and hematuria.  Musculoskeletal: Negative for back pain, joint swelling and gait problem.  Skin: Negative for rash.  Neurological: Negative for dizziness, tremors, speech difficulty and weakness.  Psychiatric/Behavioral: Positive for sleep disturbance. Negative for suicidal ideas, dysphoric mood and agitation. The patient is not nervous/anxious.    . Wt Readings from Last 3 Encounters:  10/01/12 165 lb (74.844 kg)  09/16/12 164 lb (74.39 kg)  02/23/12 165 lb (74.844 kg)   BP Readings from  Last 3 Encounters:  10/01/12 110/88  09/16/12 110/88  02/23/12 132/80         Objective:   Physical Exam  Constitutional: He is oriented to person, place, and time. He appears well-developed.  HENT:  Mouth/Throat: Oropharynx is clear and moist.  Eyes: Conjunctivae are normal. Pupils are equal, round, and reactive to light.  Neck: Normal range of motion. No JVD present. No thyromegaly present.  Cardiovascular: Normal rate, regular rhythm, normal heart sounds and intact distal pulses.  Exam reveals no gallop and no friction rub.   No murmur heard. Pulmonary/Chest: Effort normal and breath sounds normal. No respiratory distress. He has no wheezes. He has no rales. He exhibits no tenderness.  Abdominal: Soft. Bowel sounds are normal. He exhibits no distension and no mass. There is no tenderness. There is no rebound and no guarding.  Musculoskeletal: Normal range of motion. He exhibits no edema and no tenderness.  Lymphadenopathy:    He has no cervical adenopathy.  Neurological: He is alert and oriented to person, place, and time. He has normal reflexes. No cranial nerve deficit. He exhibits normal muscle tone. Coordination normal.  Skin: Skin is warm and dry. No rash noted.  Psychiatric: He has a normal mood and affect. His behavior is normal. Judgment and thought content normal.   Lab Results  Component Value Date   WBC 4.4* 09/10/2012   HGB 16.0 09/10/2012   HCT 47.2 09/10/2012   PLT 189.0 09/10/2012   GLUCOSE 84 09/10/2012   CHOL 304* 09/10/2012   TRIG 231.0* 09/10/2012   HDL 43.90 09/10/2012   LDLDIRECT 203.5 09/10/2012   ALT 29 09/10/2012   AST 24 09/10/2012  NA 139 09/10/2012   K 4.3 09/10/2012   CL 104 09/10/2012   CREATININE 1.1 09/10/2012   BUN 15 09/10/2012   CO2 28 09/10/2012   TSH 4.18 09/10/2012   PSA 0.54 09/10/2012   HGBA1C 5.5 10/12/2010          Assessment & Plan:

## 2012-10-01 NOTE — Assessment & Plan Note (Signed)
Nexium 1 a day Hold Crestor and aspirin

## 2012-10-01 NOTE — Patient Instructions (Addendum)
Nexium 1 a day Hold Crestor and aspirin Call if not better in 1-2 weeks

## 2013-01-14 ENCOUNTER — Other Ambulatory Visit (INDEPENDENT_AMBULATORY_CARE_PROVIDER_SITE_OTHER): Payer: BC Managed Care – PPO

## 2013-01-14 DIAGNOSIS — E785 Hyperlipidemia, unspecified: Secondary | ICD-10-CM

## 2013-01-14 LAB — HEPATIC FUNCTION PANEL
ALT: 35 U/L (ref 0–53)
AST: 27 U/L (ref 0–37)
Albumin: 4 g/dL (ref 3.5–5.2)
Alkaline Phosphatase: 47 U/L (ref 39–117)
Bilirubin, Direct: 0.2 mg/dL (ref 0.0–0.3)
Total Bilirubin: 2.1 mg/dL — ABNORMAL HIGH (ref 0.3–1.2)
Total Protein: 7 g/dL (ref 6.0–8.3)

## 2013-01-15 LAB — NMR LIPOPROFILE WITHOUT LIPIDS
HDL Particle Number: 32.7 umol/L (ref >=30.5)
HDL Size: 10.3 nm (ref >=9.2)
LDL Particle Number: 1784 nmol/L — ABNORMAL HIGH (ref <1000)
LDL Size: 20.2 nm — ABNORMAL LOW (ref >20.5)
LP-IR Score: 56 — ABNORMAL HIGH (ref <=45)
Large HDL-P: 3 umol/L — ABNORMAL LOW (ref >=4.8)
Large VLDL-P: 5.5 nmol/L — ABNORMAL HIGH (ref <=2.7)
Small LDL Particle Number: 1193 nmol/L — ABNORMAL HIGH (ref <=527)
VLDL Size: 48.8 nm — ABNORMAL HIGH (ref <=46.6)

## 2013-01-20 ENCOUNTER — Ambulatory Visit (INDEPENDENT_AMBULATORY_CARE_PROVIDER_SITE_OTHER): Payer: BC Managed Care – PPO | Admitting: Internal Medicine

## 2013-01-20 ENCOUNTER — Encounter: Payer: Self-pay | Admitting: Internal Medicine

## 2013-01-20 VITALS — BP 130/90 | HR 68 | Temp 98.8°F | Resp 16 | Wt 165.0 lb

## 2013-01-20 DIAGNOSIS — E785 Hyperlipidemia, unspecified: Secondary | ICD-10-CM

## 2013-01-20 DIAGNOSIS — F4321 Adjustment disorder with depressed mood: Secondary | ICD-10-CM

## 2013-01-20 DIAGNOSIS — F329 Major depressive disorder, single episode, unspecified: Secondary | ICD-10-CM

## 2013-01-20 DIAGNOSIS — N200 Calculus of kidney: Secondary | ICD-10-CM

## 2013-01-20 DIAGNOSIS — Z23 Encounter for immunization: Secondary | ICD-10-CM

## 2013-01-20 DIAGNOSIS — R1032 Left lower quadrant pain: Secondary | ICD-10-CM

## 2013-01-20 MED ORDER — ZOLPIDEM TARTRATE 10 MG PO TABS: 10.0000 mg | ORAL_TABLET | Freq: Every evening | ORAL | Status: DC | PRN

## 2013-01-20 MED ORDER — ALBUTEROL SULFATE HFA 108 (90 BASE) MCG/ACT IN AERS: 2.0000 | INHALATION_SPRAY | Freq: Four times a day (QID) | RESPIRATORY_TRACT | Status: DC | PRN

## 2013-01-20 MED ORDER — KETOROLAC TROMETHAMINE 10 MG PO TABS: 10.0000 mg | ORAL_TABLET | Freq: Four times a day (QID) | ORAL | Status: DC | PRN

## 2013-01-20 NOTE — Assessment & Plan Note (Signed)
See meds - empiric

## 2013-01-20 NOTE — Progress Notes (Signed)
Pre visit review using our clinic review tool, if applicable. No additional management support is needed unless otherwise documented below in the visit note. 

## 2013-01-20 NOTE — Assessment & Plan Note (Signed)
11/14 ? Urinary stone Rapaflo - take one a day UA Toradol

## 2013-01-20 NOTE — Patient Instructions (Signed)
Rapaflo - take one a day

## 2013-01-20 NOTE — Progress Notes (Signed)
   Subjective:    HPI  He re-started Crestor (less often) and ASA - no abd pain. He had LLQ and L LBP abd pain 2 wks ago - better - like w/a kidney stone.  The patient needs to address  chronic  chronic  hyperlipidemia    F/u stress - mother  passed away in 12/17/13F/u BPH  Review of Systems  Constitutional: Negative for appetite change, fatigue and unexpected weight change.  HENT: Negative for congestion, nosebleeds, sneezing, sore throat and trouble swallowing.   Eyes: Negative for itching and visual disturbance.  Respiratory: Negative for cough.   Cardiovascular: Negative for chest pain, palpitations and leg swelling.  Gastrointestinal: Negative for blood in stool and abdominal distention.  Musculoskeletal: Negative for back pain, gait problem, joint swelling and neck pain.  Skin: Negative for rash.  Neurological: Negative for dizziness, tremors, speech difficulty and weakness.  Psychiatric/Behavioral: Positive for sleep disturbance. Negative for suicidal ideas, dysphoric mood and agitation. The patient is not nervous/anxious.    . Wt Readings from Last 3 Encounters:  01/20/13 165 lb (74.844 kg)  10/01/12 165 lb (74.844 kg)  09/16/12 164 lb (74.39 kg)   BP Readings from Last 3 Encounters:  01/20/13 130/90  10/01/12 110/88  09/16/12 110/88         Objective:   Physical Exam  Constitutional: He is oriented to person, place, and time. He appears well-developed.  HENT:  Mouth/Throat: Oropharynx is clear and moist.  Eyes: Conjunctivae are normal. Pupils are equal, round, and reactive to light.  Neck: Normal range of motion. No JVD present. No thyromegaly present.  Cardiovascular: Normal rate, regular rhythm, normal heart sounds and intact distal pulses.  Exam reveals no gallop and no friction rub.   No murmur heard. Pulmonary/Chest: Effort normal and breath sounds normal. No respiratory distress. He has no wheezes. He has no rales. He exhibits no tenderness.   Abdominal: Soft. Bowel sounds are normal. He exhibits no distension and no mass. There is no tenderness. There is no rebound and no guarding.  Musculoskeletal: Normal range of motion. He exhibits no edema and no tenderness.  Lymphadenopathy:    He has no cervical adenopathy.  Neurological: He is alert and oriented to person, place, and time. He has normal reflexes. No cranial nerve deficit. He exhibits normal muscle tone. Coordination normal.  Skin: Skin is warm and dry. No rash noted.  Psychiatric: He has a normal mood and affect. His behavior is normal. Judgment and thought content normal.   Lab Results  Component Value Date   WBC 4.4* 09/10/2012   HGB 16.0 09/10/2012   HCT 47.2 09/10/2012   PLT 189.0 09/10/2012   GLUCOSE 84 09/10/2012   CHOL 304* 09/10/2012   TRIG 231.0* 09/10/2012   HDL 43.90 09/10/2012   LDLDIRECT 203.5 09/10/2012   ALT 35 01/14/2013   AST 27 01/14/2013   NA 139 09/10/2012   K 4.3 09/10/2012   CL 104 09/10/2012   CREATININE 1.1 09/10/2012   BUN 15 09/10/2012   CO2 28 09/10/2012   TSH 4.18 09/10/2012   PSA 0.54 09/10/2012   HGBA1C 5.5 10/12/2010          Assessment & Plan:

## 2013-01-30 NOTE — Assessment & Plan Note (Signed)
Better  

## 2013-01-30 NOTE — Assessment & Plan Note (Signed)
Continue with current prescription therapy as reflected on the Med list.  

## 2013-02-19 ENCOUNTER — Ambulatory Visit (INDEPENDENT_AMBULATORY_CARE_PROVIDER_SITE_OTHER): Payer: BC Managed Care – PPO

## 2013-02-19 DIAGNOSIS — Z23 Encounter for immunization: Secondary | ICD-10-CM

## 2013-03-09 DIAGNOSIS — R0982 Postnasal drip: Secondary | ICD-10-CM | POA: Insufficient documentation

## 2013-04-25 ENCOUNTER — Encounter: Payer: Self-pay | Admitting: Internal Medicine

## 2013-04-25 ENCOUNTER — Ambulatory Visit (INDEPENDENT_AMBULATORY_CARE_PROVIDER_SITE_OTHER): Payer: BC Managed Care – PPO | Admitting: Internal Medicine

## 2013-04-25 VITALS — BP 114/76 | HR 64 | Temp 98.1°F | Resp 16 | Wt 169.0 lb

## 2013-04-25 DIAGNOSIS — J029 Acute pharyngitis, unspecified: Secondary | ICD-10-CM

## 2013-04-25 DIAGNOSIS — J329 Chronic sinusitis, unspecified: Secondary | ICD-10-CM | POA: Insufficient documentation

## 2013-04-25 MED ORDER — AZITHROMYCIN 250 MG PO TABS: ORAL_TABLET | ORAL | Status: DC

## 2013-04-25 NOTE — Assessment & Plan Note (Signed)
[employer] 

## 2013-04-25 NOTE — Progress Notes (Signed)
Pre visit review using our clinic review tool, if applicable. No additional management support is needed unless otherwise documented below in the visit note. 

## 2013-04-25 NOTE — Progress Notes (Signed)
   Subjective:    HPI   C/o ST x 1 wk   F/u stress - mother  passed away in Dec 2013 F/u BPH  Review of Systems  Constitutional: Negative for appetite change, fatigue and unexpected weight change.  HENT: Negative for congestion, nosebleeds, sneezing, sore throat and trouble swallowing.   Eyes: Negative for itching and visual disturbance.  Respiratory: Negative for cough.   Cardiovascular: Negative for chest pain, palpitations and leg swelling.  Gastrointestinal: Negative for blood in stool and abdominal distention.  Musculoskeletal: Negative for back pain, gait problem, joint swelling and neck pain.  Skin: Negative for rash.  Neurological: Negative for dizziness, tremors, speech difficulty and weakness.  Psychiatric/Behavioral: Positive for sleep disturbance. Negative for suicidal ideas, dysphoric mood and agitation. The patient is not nervous/anxious.    . Wt Readings from Last 3 Encounters:  04/25/13 169 lb (76.658 kg)  01/20/13 165 lb (74.844 kg)  10/01/12 165 lb (74.844 kg)   BP Readings from Last 3 Encounters:  04/25/13 114/76  01/20/13 130/90  10/01/12 110/88         Objective:   Physical Exam  Constitutional: He is oriented to person, place, and time. He appears well-developed.  HENT:  Mouth/Throat: Oropharynx is clear and moist.  Eyes: Conjunctivae are normal. Pupils are equal, round, and reactive to light.  Neck: Normal range of motion. No JVD present. No thyromegaly present.  Cardiovascular: Normal rate, regular rhythm, normal heart sounds and intact distal pulses.  Exam reveals no gallop and no friction rub.   No murmur heard. Pulmonary/Chest: Effort normal and breath sounds normal. No respiratory distress. He has no wheezes. He has no rales. He exhibits no tenderness.  Abdominal: Soft. Bowel sounds are normal. He exhibits no distension and no mass. There is no tenderness. There is no rebound and no guarding.  Musculoskeletal: Normal range of motion. He  exhibits no edema and no tenderness.  Lymphadenopathy:    He has no cervical adenopathy.  Neurological: He is alert and oriented to person, place, and time. He has normal reflexes. No cranial nerve deficit. He exhibits normal muscle tone. Coordination normal.  Skin: Skin is warm and dry. No rash noted.  Psychiatric: He has a normal mood and affect. His behavior is normal. Judgment and thought content normal.  Eryth throat   Lab Results  Component Value Date   WBC 4.4* 09/10/2012   HGB 16.0 09/10/2012   HCT 47.2 09/10/2012   PLT 189.0 09/10/2012   GLUCOSE 84 09/10/2012   CHOL 304* 09/10/2012   TRIG 231.0* 09/10/2012   HDL 43.90 09/10/2012   LDLDIRECT 203.5 09/10/2012   ALT 35 01/14/2013   AST 27 01/14/2013   NA 139 09/10/2012   K 4.3 09/10/2012   CL 104 09/10/2012   CREATININE 1.1 09/10/2012   BUN 15 09/10/2012   CO2 28 09/10/2012   TSH 4.18 09/10/2012   PSA 0.54 09/10/2012   HGBA1C 5.5 10/12/2010          Assessment & Plan:

## 2013-07-31 ENCOUNTER — Encounter: Payer: Self-pay | Admitting: Internal Medicine

## 2013-08-14 ENCOUNTER — Encounter: Payer: Self-pay | Admitting: Internal Medicine

## 2013-09-24 ENCOUNTER — Encounter: Payer: Self-pay | Admitting: Internal Medicine

## 2013-10-01 ENCOUNTER — Encounter: Payer: Self-pay | Admitting: Internal Medicine

## 2014-04-10 ENCOUNTER — Telehealth: Payer: Self-pay | Admitting: Internal Medicine

## 2014-04-10 MED ORDER — ROSUVASTATIN CALCIUM 5 MG PO TABS: 5.0000 mg | ORAL_TABLET | Freq: Every day | ORAL | Status: DC

## 2014-04-10 NOTE — Telephone Encounter (Signed)
Pt request refill for rosuvastatin (CRESTOR) 5 MG tablet. [facility] pt has an appt on 04/24/14

## 2014-04-10 NOTE — Telephone Encounter (Signed)
Sent 30 day until appt 04/24/14...Raechel Chute/lmb

## 2014-04-24 ENCOUNTER — Encounter: Payer: Self-pay | Admitting: Internal Medicine

## 2014-04-24 ENCOUNTER — Ambulatory Visit (INDEPENDENT_AMBULATORY_CARE_PROVIDER_SITE_OTHER): Payer: BLUE CROSS/BLUE SHIELD | Admitting: Internal Medicine

## 2014-04-24 VITALS — BP 140/90 | HR 67 | Temp 98.5°F | Wt 170.8 lb

## 2014-04-24 DIAGNOSIS — K219 Gastro-esophageal reflux disease without esophagitis: Secondary | ICD-10-CM

## 2014-04-24 DIAGNOSIS — Z0000 Encounter for general adult medical examination without abnormal findings: Secondary | ICD-10-CM

## 2014-04-24 DIAGNOSIS — R079 Chest pain, unspecified: Secondary | ICD-10-CM

## 2014-04-24 MED ORDER — ROSUVASTATIN CALCIUM 5 MG PO TABS: 5.0000 mg | ORAL_TABLET | Freq: Every day | ORAL | Status: DC

## 2014-04-24 MED ORDER — VITAMIN D 1000 UNITS PO TABS: 1000.0000 [IU] | ORAL_TABLET | Freq: Every day | ORAL | Status: AC

## 2014-04-24 NOTE — Progress Notes (Signed)
Pre visit review using our clinic review tool, if applicable. No additional management support is needed unless otherwise documented below in the visit note. 

## 2014-04-24 NOTE — Assessment & Plan Note (Signed)
Re-start Crestor Labs in 2-3 mo

## 2014-04-24 NOTE — Assessment & Plan Note (Signed)
2016 (x 6 months) ?etiology - ?MSK vs other Will try Nexium daily x 1-2 weeks

## 2014-04-24 NOTE — Progress Notes (Signed)
   Subjective:    HPI  Pt re-started Crestor (less often) and ASA - no abd pain. Pt had L CP off and on 5-10 min x 6 months ?triggers. He started a spinning class - no CP.  The patient needs to address  chronic  chronic  hyperlipidemia    F/u stress - mother  passed away in Dec 2013 F/u BPH  Review of Systems  Constitutional: Negative for appetite change, fatigue and unexpected weight change.  HENT: Negative for congestion, nosebleeds, sneezing, sore throat and trouble swallowing.   Eyes: Negative for itching and visual disturbance.  Respiratory: Negative for cough.   Cardiovascular: Negative for chest pain, palpitations and leg swelling.  Gastrointestinal: Negative for blood in stool and abdominal distention.  Musculoskeletal: Negative for back pain, joint swelling, gait problem and neck pain.  Skin: Negative for rash.  Neurological: Negative for dizziness, tremors, speech difficulty and weakness.  Psychiatric/Behavioral: Positive for sleep disturbance. Negative for suicidal ideas, dysphoric mood and agitation. The patient is not nervous/anxious.    . Wt Readings from Last 3 Encounters:  04/24/14 170 lb 12 oz (77.452 kg)  04/25/13 169 lb (76.658 kg)  01/20/13 165 lb (74.844 kg)   BP Readings from Last 3 Encounters:  04/24/14 140/90  04/25/13 114/76  01/20/13 130/90         Objective:   Physical Exam  Constitutional: He is oriented to person, place, and time. He appears well-developed.  HENT:  Mouth/Throat: Oropharynx is clear and moist.  Eyes: Conjunctivae are normal. Pupils are equal, round, and reactive to light.  Neck: Normal range of motion. No JVD present. No thyromegaly present.  Cardiovascular: Normal rate, regular rhythm, normal heart sounds and intact distal pulses.  Exam reveals no gallop and no friction rub.   No murmur heard. Pulmonary/Chest: Effort normal and breath sounds normal. No respiratory distress. He has no wheezes. He has no rales. He exhibits  no tenderness.  Abdominal: Soft. Bowel sounds are normal. He exhibits no distension and no mass. There is no tenderness. There is no rebound and no guarding.  Musculoskeletal: Normal range of motion. He exhibits no edema or tenderness.  Lymphadenopathy:    He has no cervical adenopathy.  Neurological: He is alert and oriented to person, place, and time. He has normal reflexes. No cranial nerve deficit. He exhibits normal muscle tone. Coordination normal.  Skin: Skin is warm and dry. No rash noted.  Psychiatric: He has a normal mood and affect. His behavior is normal. Judgment and thought content normal.   Lab Results  Component Value Date   WBC 4.4* 09/10/2012   HGB 16.0 09/10/2012   HCT 47.2 09/10/2012   PLT 189.0 09/10/2012   GLUCOSE 84 09/10/2012   CHOL 304* 09/10/2012   TRIG 231.0* 09/10/2012   HDL 43.90 09/10/2012   LDLDIRECT 203.5 09/10/2012   ALT 35 01/14/2013   AST 27 01/14/2013   NA 139 09/10/2012   K 4.3 09/10/2012   CL 104 09/10/2012   CREATININE 1.1 09/10/2012   BUN 15 09/10/2012   CO2 28 09/10/2012   TSH 4.18 09/10/2012   PSA 0.54 09/10/2012   HGBA1C 5.5 10/12/2010          Assessment & Plan:

## 2014-04-25 NOTE — Assessment & Plan Note (Signed)
On Pepcid Trial of Nexium

## 2014-05-01 ENCOUNTER — Ambulatory Visit: Payer: Self-pay | Admitting: Internal Medicine

## 2014-09-18 ENCOUNTER — Ambulatory Visit: Payer: BLUE CROSS/BLUE SHIELD | Admitting: Internal Medicine

## 2014-12-16 ENCOUNTER — Ambulatory Visit: Payer: BLUE CROSS/BLUE SHIELD | Admitting: Internal Medicine

## 2015-03-17 ENCOUNTER — Ambulatory Visit: Payer: BLUE CROSS/BLUE SHIELD | Admitting: Internal Medicine

## 2015-03-20 ENCOUNTER — Ambulatory Visit (INDEPENDENT_AMBULATORY_CARE_PROVIDER_SITE_OTHER): Payer: Managed Care, Other (non HMO) | Admitting: Family Medicine

## 2015-03-20 ENCOUNTER — Encounter: Payer: Self-pay | Admitting: Family Medicine

## 2015-03-20 VITALS — BP 122/86 | HR 69 | Temp 98.2°F | Ht 65.0 in | Wt 169.0 lb

## 2015-03-20 DIAGNOSIS — J019 Acute sinusitis, unspecified: Secondary | ICD-10-CM

## 2015-03-20 MED ORDER — CEFDINIR 300 MG PO CAPS: 300.0000 mg | ORAL_CAPSULE | Freq: Two times a day (BID) | ORAL | Status: DC

## 2015-03-20 NOTE — Patient Instructions (Signed)

## 2015-03-20 NOTE — Progress Notes (Signed)
Pre visit review using our clinic review tool, if applicable. No additional management support is needed unless otherwise documented below in the visit note. 

## 2015-03-20 NOTE — Progress Notes (Signed)
   Subjective:    Patient ID: Kyle Griffin, male    DOB: [birth year], 50 y.o.   MRN: [id number]  HPI Patient seen Saturday clinic with few day history of sore throat, intermittent headaches, bimaxillary and ethmoid sinus pressure and some yellowish nasal discharge. He states he had some leftover Cefdinir from his wife's prescription that he started back couple days ago but did not have any to take yesterday. He does not have any shortness of breath but does have some productive cough. No nausea or vomiting.  Past Medical History  Diagnosis Date  . Hyperlipidemia   . GERD (gastroesophageal reflux disease)   . Nephrolithiasis   . IBS (irritable bowel syndrome)   . Allergic rhinitis   . Asthma, exercise induced   . Gilbert disease   . Renal stones    Past Surgical History  Procedure Laterality Date  . Rotator cuff repair    . Foot surgery      Cyst, Heel    reports that he quit smoking about 15 years ago. He does not have any smokeless tobacco history on file. He reports that he does not drink alcohol or use illicit drugs. family history includes Cancer (age of onset: 7) in his mother; Coronary artery disease in his mother; Heart disease in his father and mother; Hyperlipidemia in his father and mother; Stroke in his mother. There is no history of Diabetes. Allergies  Allergen Reactions  . Oxycodone-Acetaminophen Hives and Itching      Review of Systems  Constitutional: Negative for fever and chills.  HENT: Positive for congestion and sinus pressure.   Respiratory: Positive for cough.        Objective:   Physical Exam  Constitutional: He appears well-developed and well-nourished.  HENT:  Right Ear: External ear normal.  Left Ear: External ear normal.  Mouth/Throat: Oropharynx is clear and moist.  Slightly erythematous nasal mucosa, otherwise normal  Neck: Neck supple.  Cardiovascular: Normal rate and regular rhythm.   Pulmonary/Chest: Effort normal and breath  sounds normal. No respiratory distress. He has no wheezes. He has no rales.  Lymphadenopathy:    He has no cervical adenopathy.          Assessment & Plan:  Acute sinusitis. We explained these are usually viral but since he had already started course of Cefdinir will finish that out. Consider Mucinex. Stay well-hydrated. Follow-up when necessary

## 2015-05-24 ENCOUNTER — Other Ambulatory Visit (INDEPENDENT_AMBULATORY_CARE_PROVIDER_SITE_OTHER): Payer: Managed Care, Other (non HMO)

## 2015-05-24 ENCOUNTER — Other Ambulatory Visit: Payer: Self-pay | Admitting: *Deleted

## 2015-05-24 DIAGNOSIS — R7989 Other specified abnormal findings of blood chemistry: Secondary | ICD-10-CM

## 2015-05-24 DIAGNOSIS — Z0000 Encounter for general adult medical examination without abnormal findings: Secondary | ICD-10-CM | POA: Diagnosis not present

## 2015-05-24 LAB — URINALYSIS, ROUTINE W REFLEX MICROSCOPIC
Bilirubin Urine: NEGATIVE
Hgb urine dipstick: NEGATIVE
Ketones, ur: NEGATIVE
Leukocytes, UA: NEGATIVE
Nitrite: NEGATIVE
Specific Gravity, Urine: 1.015 (ref 1.000–1.030)
Total Protein, Urine: NEGATIVE
Urine Glucose: NEGATIVE
Urobilinogen, UA: 0.2 (ref 0.0–1.0)
pH: 6 (ref 5.0–8.0)

## 2015-05-24 LAB — LIPID PANEL
Cholesterol: 243 mg/dL — ABNORMAL HIGH (ref 0–200)
HDL: 43.8 mg/dL (ref >39.00)
NonHDL: 198.76
Total CHOL/HDL Ratio: 6
Triglycerides: 220 mg/dL — ABNORMAL HIGH (ref 0.0–149.0)
VLDL: 44 mg/dL — ABNORMAL HIGH (ref 0.0–40.0)

## 2015-05-24 LAB — BASIC METABOLIC PANEL
BUN: 12 mg/dL (ref 6–23)
CO2: 31 mEq/L (ref 19–32)
Calcium: 9.6 mg/dL (ref 8.4–10.5)
Chloride: 105 mEq/L (ref 96–112)
Creatinine, Ser: 1.09 mg/dL (ref 0.40–1.50)
GFR: 75.6 mL/min (ref >60.00)
Glucose, Bld: 86 mg/dL (ref 70–99)
Potassium: 4.4 mEq/L (ref 3.5–5.1)
Sodium: 143 mEq/L (ref 135–145)

## 2015-05-24 LAB — HEPATIC FUNCTION PANEL
ALT: 37 U/L (ref 0–53)
AST: 23 U/L (ref 0–37)
Albumin: 4.6 g/dL (ref 3.5–5.2)
Alkaline Phosphatase: 57 U/L (ref 39–117)
Bilirubin, Direct: 0.2 mg/dL (ref 0.0–0.3)
Total Bilirubin: 2.5 mg/dL — ABNORMAL HIGH (ref 0.2–1.2)
Total Protein: 6.8 g/dL (ref 6.0–8.3)

## 2015-05-24 LAB — CBC WITH DIFFERENTIAL/PLATELET
Basophils Absolute: 0 10*3/uL (ref 0.0–0.1)
Basophils Relative: 0.8 % (ref 0.0–3.0)
Eosinophils Absolute: 0.3 10*3/uL (ref 0.0–0.7)
Eosinophils Relative: 6.2 % — ABNORMAL HIGH (ref 0.0–5.0)
HCT: 44.9 % (ref 39.0–52.0)
Hemoglobin: 15.5 g/dL (ref 13.0–17.0)
Lymphocytes Relative: 28 % (ref 12.0–46.0)
Lymphs Abs: 1.4 10*3/uL (ref 0.7–4.0)
MCHC: 34.5 g/dL (ref 30.0–36.0)
MCV: 87 fl (ref 78.0–100.0)
Monocytes Absolute: 0.5 10*3/uL (ref 0.1–1.0)
Monocytes Relative: 10.1 % (ref 3.0–12.0)
Neutro Abs: 2.7 10*3/uL (ref 1.4–7.7)
Neutrophils Relative %: 54.9 % (ref 43.0–77.0)
Platelets: 202 10*3/uL (ref 150.0–400.0)
RBC: 5.17 Mil/uL (ref 4.22–5.81)
RDW: 13 % (ref 11.5–15.5)
WBC: 4.9 10*3/uL (ref 4.0–10.5)

## 2015-05-24 LAB — PSA: PSA: 0.56 ng/mL (ref 0.10–4.00)

## 2015-05-24 LAB — LDL CHOLESTEROL, DIRECT: Direct LDL: 154 mg/dL

## 2015-05-31 ENCOUNTER — Ambulatory Visit (INDEPENDENT_AMBULATORY_CARE_PROVIDER_SITE_OTHER): Payer: Managed Care, Other (non HMO) | Admitting: Internal Medicine

## 2015-05-31 ENCOUNTER — Encounter: Payer: Self-pay | Admitting: Internal Medicine

## 2015-05-31 VITALS — BP 138/90 | HR 73 | Wt 167.0 lb

## 2015-05-31 DIAGNOSIS — R42 Dizziness and giddiness: Secondary | ICD-10-CM

## 2015-05-31 MED ORDER — MECLIZINE HCL 12.5 MG PO TABS: 12.5000 mg | ORAL_TABLET | Freq: Three times a day (TID) | ORAL | Status: DC | PRN

## 2015-05-31 NOTE — Progress Notes (Signed)
Pre visit review using our clinic review tool, if applicable. No additional management support is needed unless otherwise documented below in the visit note. 

## 2015-05-31 NOTE — Assessment & Plan Note (Signed)
4/17 ?BPV Started after a cruise Meclizine prn

## 2015-05-31 NOTE — Progress Notes (Signed)
Subjective:  Patient ID: Kyle Griffin, male    DOB: Nov 22, 1965  Age: 50 y.o. MRN: [id number]  CC: No chief complaint on file.   HPI Kyle Griffin presents for dizziness at times since March  Outpatient Prescriptions Prior to Visit  Medication Sig Dispense Refill  . cetirizine (ZYRTEC) 10 MG tablet Take 10 mg by mouth daily as needed. allergies    . famotidine (PEPCID) 20 MG tablet Take 20 mg by mouth daily as needed. ulcers    . rosuvastatin (CRESTOR) 5 MG tablet Take 1 tablet (5 mg total) by mouth daily. 90 tablet 3  . cefdinir (OMNICEF) 300 MG capsule Take 1 capsule (300 mg total) by mouth 2 (two) times daily. 20 capsule 0   No facility-administered medications prior to visit.    ROS Review of Systems  Constitutional: Negative for appetite change, fatigue and unexpected weight change.  HENT: Negative for congestion, nosebleeds, sneezing, sore throat and trouble swallowing.   Eyes: Negative for itching and visual disturbance.  Respiratory: Negative for cough.   Cardiovascular: Negative for chest pain, palpitations and leg swelling.  Gastrointestinal: Negative for nausea, diarrhea, blood in stool and abdominal distention.  Genitourinary: Negative for frequency and hematuria.  Musculoskeletal: Negative for back pain, joint swelling, gait problem and neck pain.  Skin: Negative for rash.  Neurological: Positive for dizziness. Negative for tremors, speech difficulty and weakness.  Psychiatric/Behavioral: Negative for suicidal ideas, sleep disturbance, dysphoric mood and agitation. The patient is not nervous/anxious.     Objective:  BP 138/90 mmHg  Pulse 73  Wt 167 lb (75.751 kg)  SpO2 96%  BP Readings from Last 3 Encounters:  05/31/15 138/90  03/20/15 122/86  04/24/14 140/90    Wt Readings from Last 3 Encounters:  05/31/15 167 lb (75.751 kg)  03/20/15 169 lb (76.658 kg)  04/24/14 170 lb 12 oz (77.452 kg)    Physical Exam  Constitutional: He is oriented to  person, place, and time. He appears well-developed. No distress.  NAD  HENT:  Mouth/Throat: Oropharynx is clear and moist.  Eyes: Conjunctivae are normal. Pupils are equal, round, and reactive to light.  Neck: Normal range of motion. No JVD present. No thyromegaly present.  Cardiovascular: Normal rate, regular rhythm, normal heart sounds and intact distal pulses.  Exam reveals no gallop and no friction rub.   No murmur heard. Pulmonary/Chest: Effort normal and breath sounds normal. No respiratory distress. He has no wheezes. He has no rales. He exhibits no tenderness.  Abdominal: Soft. Bowel sounds are normal. He exhibits no distension and no mass. There is no tenderness. There is no rebound and no guarding.  Musculoskeletal: Normal range of motion. He exhibits no edema or tenderness.  Lymphadenopathy:    He has no cervical adenopathy.  Neurological: He is alert and oriented to person, place, and time. He has normal reflexes. No cranial nerve deficit. He exhibits normal muscle tone. He displays a negative Romberg sign. Coordination and gait normal.  Skin: Skin is warm and dry. No rash noted.  Psychiatric: He has a normal mood and affect. His behavior is normal. Judgment and thought content normal.    Lab Results  Component Value Date   WBC 4.9 05/24/2015   HGB 15.5 05/24/2015   HCT 44.9 05/24/2015   PLT 202.0 05/24/2015   GLUCOSE 86 05/24/2015   CHOL 243* 05/24/2015   TRIG 220.0* 05/24/2015   HDL 43.80 05/24/2015   LDLDIRECT 154.0 05/24/2015   ALT 37 05/24/2015  AST 23 05/24/2015   NA 143 05/24/2015   K 4.4 05/24/2015   CL 105 05/24/2015   CREATININE 1.09 05/24/2015   BUN 12 05/24/2015   CO2 31 05/24/2015   TSH 4.18 09/10/2012   PSA 0.56 05/24/2015   HGBA1C 5.5 10/12/2010    No results found.  Assessment & Plan:   There are no diagnoses linked to this encounter. I have discontinued Kyle Griffin's cefdinir. I am also having him maintain his famotidine, cetirizine, and  rosuvastatin.  No orders of the defined types were placed in this encounter.     Follow-up: No Follow-up on file.  Sonda PrimesAlex Yasha Tibbett, MD

## 2015-06-06 ENCOUNTER — Encounter: Payer: Self-pay | Admitting: Internal Medicine

## 2015-08-07 ENCOUNTER — Encounter (HOSPITAL_COMMUNITY): Payer: Self-pay | Admitting: Emergency Medicine

## 2015-08-07 ENCOUNTER — Ambulatory Visit (HOSPITAL_COMMUNITY)
Admission: EM | Admit: 2015-08-07 | Discharge: 2015-08-07 | Disposition: A | Discharge status: Home / Self Care | Payer: Managed Care, Other (non HMO) | Attending: Family Medicine | Admitting: Family Medicine

## 2015-08-07 DIAGNOSIS — S61211A Laceration without foreign body of left index finger without damage to nail, initial encounter: Secondary | ICD-10-CM

## 2015-08-07 DIAGNOSIS — S61012A Laceration without foreign body of left thumb without damage to nail, initial encounter: Secondary | ICD-10-CM

## 2015-08-07 DIAGNOSIS — S61002A Unspecified open wound of left thumb without damage to nail, initial encounter: Secondary | ICD-10-CM

## 2015-08-07 MED ORDER — LIDOCAINE HCL 2 % IJ SOLN
INTRAMUSCULAR | Status: AC
  Filled 2015-08-07: qty 20

## 2015-08-07 MED ORDER — LIDOCAINE HCL (PF) 1 % IJ SOLN
30.0000 mL | Freq: Once | INTRAMUSCULAR | Status: AC
  Administered 2015-08-07: 30 mL

## 2015-08-07 NOTE — Discharge Instructions (Signed)

## 2015-08-07 NOTE — ED Notes (Addendum)
Accidentally cut left hand with a razor blade.  Little bleeding occuring at present.  Laceration to left thumb and left index finger.  Laceration next to thumb nail and laceration across left index finger knuckle .  Patient has a documented tdap on September 16, 2012.

## 2015-08-07 NOTE — ED Provider Notes (Signed)
CSN: [id number]     Arrival date & time 08/07/15  1752 History   None    Chief Complaint  Patient presents with  . Laceration   (Consider location/radiation/quality/duration/timing/severity/associated sxs/prior Treatment) Patient is a 50 y.o. male presenting with skin laceration. The history is provided by the patient. No language interpreter was used.  Laceration Location:  Finger Finger laceration location:  L index finger and L thumb Length (cm):  2 Quality: straight   Bleeding: controlled with pressure   Time since incident:  2 hours Laceration mechanism:  Razor Pain details:    Quality:  Aching   Severity:  Moderate   Timing:  Constant   Progression:  Worsening Foreign body present:  No foreign bodies Relieved by:  Nothing Worsened by:  Nothing tried Tetanus status:  Up to date   Past Medical History  Diagnosis Date  . Hyperlipidemia   . GERD (gastroesophageal reflux disease)   . Nephrolithiasis   . IBS (irritable bowel syndrome)   . Allergic rhinitis   . Asthma, exercise induced   . Gilbert disease   . Renal stones    Past Surgical History  Procedure Laterality Date  . Rotator cuff repair    . Foot surgery      Cyst, Heel   Family History  Problem Relation Age of Onset  . Coronary artery disease Mother   . Stroke Mother   . Heart disease Mother   . Hyperlipidemia Mother   . Cancer Mother [id number]    lung ca  . Heart disease Father   . Hyperlipidemia Father   . Diabetes Neg Hx    Social History  Substance Use Topics  . Smoking status: Former Smoker    Quit date: 12/23/1999  . Smokeless tobacco: None  . Alcohol Use: No    Review of Systems  All other systems reviewed and are negative.   Allergies  Oxycodone-acetaminophen  Home Medications   Prior to Admission medications   Medication Sig Start Date End Date Taking? Authorizing Provider  cetirizine (ZYRTEC) 10 MG tablet Take 10 mg by mouth daily as needed. allergies    Historical Provider, MD   famotidine (PEPCID) 20 MG tablet Take 20 mg by mouth daily as needed. ulcers    Historical Provider, MD  meclizine (ANTIVERT) 12.5 MG tablet Take 1 tablet (12.5 mg total) by mouth 3 (three) times daily as needed for dizziness. 05/31/15   Georgina QuintAleksei V Plotnikov, MD  rosuvastatin (CRESTOR) 5 MG tablet Take 1 tablet (5 mg total) by mouth daily. 04/24/14   Tresa GarterAleksei V Plotnikov, MD   Meds Ordered and Administered this Visit   Medications  lidocaine (PF) (XYLOCAINE) 1 % injection 30 mL (not administered)    BP 126/75 mmHg  Pulse 74  Temp(Src) 98.7 F (37.1 C) (Oral)  SpO2 96% No data found.   Physical Exam  Constitutional: He appears well-developed and well-nourished.  HENT:  Head: Normocephalic.  Eyes: Pupils are equal, round, and reactive to light.  Musculoskeletal: He exhibits tenderness.  1cm laceration distal left thumb.  1 cm laceration crease of dorsal index finger at pip nv and ns intact  Neurological: He is alert.  Skin: Skin is warm.  Psychiatric: He has a normal mood and affect.  Nursing note and vitals reviewed.   ED Course  .Marland Kitchen.Laceration Repair Date/Time: 08/07/2015 8:11 PM Performed by: Elson AreasSOFIA, Bhargav Barbaro K Authorized by: Bradd CanaryKINDL, JAMES D Consent: Verbal consent not obtained. Risks and benefits: risks, benefits and alternatives were discussed Consent given  by: patient Patient understanding: patient states understanding of the procedure being performed Laceration length: 2 cm Foreign bodies: no foreign bodies Tendon involvement: none Nerve involvement: none Vascular damage: no Local anesthetic: lidocaine 2% without epinephrine Preparation: Patient was prepped and draped in the usual sterile fashion. Debridement: none Skin closure: 3-0 nylon and 5-0 Prolene Number of sutures: 5 Technique: simple Approximation: loose Approximation difficulty: simple Patient tolerance: Patient tolerated the procedure well with no immediate complications   (including critical care  time)  Labs Review Labs Reviewed - No data to display  Imaging Review No results found.   Visual Acuity Review  Right Eye Distance:   Left Eye Distance:   Bilateral Distance:    Right Eye Near:   Left Eye Near:    Bilateral Near:         MDM   1. Laceration of left thumb, initial encounter   2. Laceration of left index finger w/o foreign body w/o damage to nail, initial encounter    An After Visit Summary was printed and given to the patient.   Elson Areas, PA-C 08/07/15 2012

## 2015-10-26 ENCOUNTER — Ambulatory Visit (INDEPENDENT_AMBULATORY_CARE_PROVIDER_SITE_OTHER): Payer: Managed Care, Other (non HMO) | Admitting: Internal Medicine

## 2015-10-26 ENCOUNTER — Encounter: Payer: Self-pay | Admitting: Internal Medicine

## 2015-10-26 DIAGNOSIS — K143 Hypertrophy of tongue papillae: Secondary | ICD-10-CM | POA: Insufficient documentation

## 2015-10-26 MED ORDER — NYSTATIN 100000 UNIT/ML MT SUSP: 500000.0000 [IU] | Freq: Four times a day (QID) | OROMUCOSAL | 2 refills | Status: AC

## 2015-10-26 NOTE — Progress Notes (Signed)
   Subjective:    Patient ID: Kyle Griffin, male    DOB: [birth year], 50 y.o.   MRN: [id number]  HPI here with 1 wk worsening unusual coating to the tongue, mostly to the dorsal aspect, has a known tongue fissure that is not painful in past, but now some mild discomfort as well. No trauma, fever, swelling, ST, cough or sob.   Denies HIV risk factors,  Has no hx of immune depression or DM, no prior hx of thrush Past Medical History:  Diagnosis Date  . Allergic rhinitis   . Asthma, exercise induced   . GERD (gastroesophageal reflux disease)   . Gilbert disease   . Hyperlipidemia   . IBS (irritable bowel syndrome)   . Nephrolithiasis   . Renal stones    Past Surgical History:  Procedure Laterality Date  . FOOT SURGERY     Cyst, Heel  . ROTATOR CUFF REPAIR      reports that he quit smoking about 15 years ago. He does not have any smokeless tobacco history on file. He reports that he does not drink alcohol or use drugs. family history includes Cancer (age of onset: 382) in his mother; Coronary artery disease in his mother; Heart disease in his father and mother; Hyperlipidemia in his father and mother; Stroke in his mother. Allergies  Allergen Reactions  . Oxycodone-Acetaminophen Hives and Itching   Current Outpatient Prescriptions on File Prior to Visit  Medication Sig Dispense Refill  . cetirizine (ZYRTEC) 10 MG tablet Take 10 mg by mouth daily as needed. allergies    . famotidine (PEPCID) 20 MG tablet Take 20 mg by mouth daily as needed. ulcers    . meclizine (ANTIVERT) 12.5 MG tablet Take 1 tablet (12.5 mg total) by mouth 3 (three) times daily as needed for dizziness. 60 tablet 1  . rosuvastatin (CRESTOR) 5 MG tablet Take 1 tablet (5 mg total) by mouth daily. 90 tablet 3   No current facility-administered medications on file prior to visit.    Review of Systems All otherwise neg per pt     Objective:   Physical Exam BP 140/80   Pulse 61   Resp 20   Wt 169 lb (76.7 kg)    SpO2 97%   BMI 28.12 kg/m  VS noted, non toxic, not ill appearing Constitutional: Pt appears in no apparent distress HENT: Head: NCAT.  Right Ear: External ear normal.  Left Ear: External ear normal.  Eyes: . Pupils are equal, round, and reactive to light. Conjunctivae and EOM are normal Tongue with dorsal coating/rash , whitish tan with midline shallow tongue fissur, nontender overall Neck: Normal range of motion. Neck supple.  Cardiovascular: Normal rate and regular rhythm.   Pulmonary/Chest: Effort normal and breath sounds without rales or wheezing.  Abd:  Soft, NT, ND, + BS Neurological: Pt is alert. Not confused , motor grossly intact Skin: Skin is warm. No rash, no LE edema Psychiatric: Pt behavior is normal. No agitation.       Assessment & Plan:

## 2015-10-26 NOTE — Progress Notes (Signed)
Pre visit review using our clinic review tool, if applicable. No additional management support is needed unless otherwise documented below in the visit note. 

## 2015-10-26 NOTE — Assessment & Plan Note (Signed)
C/w possible thrush, for nystatin soln s and spit asd,  to f/u any worsening symptoms or concerns, consider oral surgeon if not improved

## 2015-10-26 NOTE — Patient Instructions (Signed)
Please take all new medication as prescribed - the mouth rinse solution  Please continue all other medications as before, and refills have been done if requested.  Please have the pharmacy call with any other refills you may need.  Please keep your appointments with your specialists as you may have planned

## 2015-11-17 ENCOUNTER — Ambulatory Visit (INDEPENDENT_AMBULATORY_CARE_PROVIDER_SITE_OTHER): Payer: Managed Care, Other (non HMO) | Admitting: Internal Medicine

## 2015-11-17 ENCOUNTER — Encounter: Payer: Self-pay | Admitting: Internal Medicine

## 2015-11-17 VITALS — BP 130/80 | HR 76 | Temp 98.6°F | Wt 170.0 lb

## 2015-11-17 DIAGNOSIS — K219 Gastro-esophageal reflux disease without esophagitis: Secondary | ICD-10-CM | POA: Diagnosis not present

## 2015-11-17 DIAGNOSIS — Z23 Encounter for immunization: Secondary | ICD-10-CM | POA: Diagnosis not present

## 2015-11-17 DIAGNOSIS — K141 Geographic tongue: Secondary | ICD-10-CM

## 2015-11-17 MED ORDER — FAMOTIDINE 20 MG PO TABS: 20.0000 mg | ORAL_TABLET | Freq: Every day | ORAL | 2 refills | Status: AC

## 2015-11-17 NOTE — Progress Notes (Signed)
Pre visit review using our clinic review tool, if applicable. No additional management support is needed unless otherwise documented below in the visit note. 

## 2015-11-17 NOTE — Assessment & Plan Note (Signed)
Use Pepcid ?

## 2015-11-17 NOTE — Patient Instructions (Addendum)
Use [employer] toothpaste Cut back on hot and spicy food Take Pepcid No leukoplakia

## 2015-11-17 NOTE — Progress Notes (Signed)
   Subjective:  Patient ID: Kyle Griffin, male    DOB: 1965-09-18  Age: 50 y.o. MRN: [id number]  CC: No chief complaint on file.   HPI Kyle LennertJason C Griffin presents for a change in his tongue appearance >1 month. The pt saw Dr Jonny RuizJohn; Rx did not help  Outpatient Medications Prior to Visit  Medication Sig Dispense Refill  . cetirizine (ZYRTEC) 10 MG tablet Take 10 mg by mouth daily as needed. allergies    . meclizine (ANTIVERT) 12.5 MG tablet Take 1 tablet (12.5 mg total) by mouth 3 (three) times daily as needed for dizziness. 60 tablet 1  . rosuvastatin (CRESTOR) 5 MG tablet Take 1 tablet (5 mg total) by mouth daily. 90 tablet 3  . famotidine (PEPCID) 20 MG tablet Take 20 mg by mouth daily as needed. ulcers     No facility-administered medications prior to visit.     ROS Review of Systems  Constitutional: Negative for appetite change, chills and fatigue.  HENT: Negative for congestion, dental problem, ear pain, facial swelling, hearing loss, mouth sores, postnasal drip, rhinorrhea, sinus pressure, sore throat, trouble swallowing and voice change.   Respiratory: Negative for cough.     Objective:  BP 130/80   Pulse 76   Temp 98.6 F (37 C) (Oral)   Wt 170 lb (77.1 kg)   SpO2 95%   BMI 28.29 kg/m   BP Readings from Last 3 Encounters:  11/17/15 130/80  10/26/15 140/80  08/07/15 126/75    Wt Readings from Last 3 Encounters:  11/17/15 170 lb (77.1 kg)  10/26/15 169 lb (76.7 kg)  05/31/15 167 lb (75.8 kg)    Physical Exam  Constitutional: He appears well-nourished. No distress.  Eyes: Left eye exhibits no discharge. No scleral icterus.  Neck: No thyromegaly present.  Cardiovascular: Normal rate and regular rhythm.   No murmur heard. Pulmonary/Chest: He has no rales.  Abdominal: There is no tenderness.  Neurological: Coordination normal.   No leukoplakia Geographic tongue   Lab Results  Component Value Date   WBC 4.9 05/24/2015   HGB 15.5 05/24/2015   HCT  44.9 05/24/2015   PLT 202.0 05/24/2015   GLUCOSE 86 05/24/2015   CHOL 243 (H) 05/24/2015   TRIG 220.0 (H) 05/24/2015   HDL 43.80 05/24/2015   LDLDIRECT 154.0 05/24/2015   ALT 37 05/24/2015   AST 23 05/24/2015   NA 143 05/24/2015   K 4.4 05/24/2015   CL 105 05/24/2015   CREATININE 1.09 05/24/2015   BUN 12 05/24/2015   CO2 31 05/24/2015   TSH 4.18 09/10/2012   PSA 0.56 05/24/2015   HGBA1C 5.5 10/12/2010    No results found.  Assessment & Plan:   Diagnoses and all orders for this visit:  Geographic tongue  Gastroesophageal reflux disease without esophagitis  Other orders -     famotidine (PEPCID) 20 MG tablet; Take 1 tablet (20 mg total) by mouth daily. ulcers   I have changed Kyle Griffin's famotidine. I am also having him maintain his cetirizine, rosuvastatin, and meclizine.  Meds ordered this encounter  Medications  . famotidine (PEPCID) 20 MG tablet    Sig: Take 1 tablet (20 mg total) by mouth daily. ulcers    Dispense:  30 tablet    Refill:  2     Follow-up: No Follow-up on file.  Sonda PrimesAlex Treyana Sturgell, MD

## 2015-11-17 NOTE — Assessment & Plan Note (Addendum)
Use [employer] toothpaste Cut back on hot and spicy food No leukoplakia  Dental appt pending

## 2015-11-19 ENCOUNTER — Encounter: Payer: Self-pay | Admitting: Internal Medicine

## 2015-11-24 ENCOUNTER — Telehealth: Payer: Self-pay | Admitting: Internal Medicine

## 2015-11-24 NOTE — Telephone Encounter (Signed)
Patient is requesting Dr. Macario GoldsPlot to send a pain med that can be dropped in the ear for a ear infection.  Patient had to go to [facility] for a sinus infection and ear infection.  [facility] would not prescribe patient this.  States patient is currently taking advil but that alone is not helping.

## 2015-11-25 MED ORDER — ANTIPYRINE-BENZOCAINE 5.4-1.4 % OT SOLN: OTIC | 0 refills | Status: DC

## 2015-11-25 NOTE — Telephone Encounter (Signed)
OK Auralgan drops Can't use if the ear drum is perforated Thx

## 2015-11-26 NOTE — Telephone Encounter (Signed)
Left detailed mess informing pt of below.  

## 2015-12-09 ENCOUNTER — Ambulatory Visit (INDEPENDENT_AMBULATORY_CARE_PROVIDER_SITE_OTHER): Payer: Managed Care, Other (non HMO) | Admitting: Internal Medicine

## 2015-12-09 ENCOUNTER — Encounter: Payer: Self-pay | Admitting: Internal Medicine

## 2015-12-09 DIAGNOSIS — H6691 Otitis media, unspecified, right ear: Secondary | ICD-10-CM

## 2015-12-09 MED ORDER — LEVOFLOXACIN 500 MG PO TABS: 500.0000 mg | ORAL_TABLET | Freq: Every day | ORAL | 0 refills | Status: AC

## 2015-12-09 MED ORDER — TRAMADOL HCL 50 MG PO TABS: 50.0000 mg | ORAL_TABLET | Freq: Three times a day (TID) | ORAL | 0 refills | Status: DC | PRN

## 2015-12-09 NOTE — Patient Instructions (Signed)
Please take all new medication as prescribed - the antibiotic, and pain medication  You can also take Mucinex (or it's generic off brand) for congestion, and tylenol as needed for pain.  Please continue all other medications as before, and refills have been done if requested.  Please have the pharmacy call with any other refills you may need.  Please keep your appointments with your specialists as you may have planned

## 2015-12-09 NOTE — Progress Notes (Signed)
Pre visit review using our clinic review tool, if applicable. No additional management support is needed unless otherwise documented below in the visit note. 

## 2015-12-11 DIAGNOSIS — H6691 Otitis media, unspecified, right ear: Secondary | ICD-10-CM | POA: Insufficient documentation

## 2015-12-11 NOTE — Assessment & Plan Note (Addendum)
Mild to mod, for antibx course,  Pain control, and mucinex otc prn, to f/u any worsening symptoms or concerns

## 2015-12-11 NOTE — Progress Notes (Signed)
   Subjective:    Patient ID: Kyle Griffin, male    DOB: November 11, 1965, 50 y.o.   MRN: [id number]  HPI   Here with 3 days acute onset fever, left ear pain 7/10, constant, mod to severe, assoc wtih pressure, headache, muffled hearing, and general weakness and malaise, but denies ST, cough,and pt denies chest pain, wheezing, increased sob or doe, orthopnea, PND, increased LE swelling, palpitations, dizziness or syncope.  Pt denies new neurological symptoms such as new headache, or facial or extremity weakness or numbness   Pt denies polydipsia, polyuria Past Medical History:  Diagnosis Date  . Allergic rhinitis   . Asthma, exercise induced   . GERD (gastroesophageal reflux disease)   . Gilbert disease   . Hyperlipidemia   . IBS (irritable bowel syndrome)   . Nephrolithiasis   . Renal stones    Past Surgical History:  Procedure Laterality Date  . FOOT SURGERY     Cyst, Heel  . ROTATOR CUFF REPAIR      reports that he quit smoking about 15 years ago. He has quit using smokeless tobacco. He reports that he does not drink alcohol or use drugs. family history includes Cancer (age of onset: 2782) in his mother; Coronary artery disease in his mother; Heart disease in his father and mother; Hyperlipidemia in his father and mother; Stroke in his mother. Allergies  Allergen Reactions  . Oxycodone-Acetaminophen Hives and Itching   Current Outpatient Prescriptions on File Prior to Visit  Medication Sig Dispense Refill  . antipyrine-benzocaine (AURALGAN) otic solution 4 gtt in the ear q 2 hrs prn ear pain 10 mL 0  . cetirizine (ZYRTEC) 10 MG tablet Take 10 mg by mouth daily as needed. allergies    . famotidine (PEPCID) 20 MG tablet Take 1 tablet (20 mg total) by mouth daily. ulcers 30 tablet 2  . meclizine (ANTIVERT) 12.5 MG tablet Take 1 tablet (12.5 mg total) by mouth 3 (three) times daily as needed for dizziness. 60 tablet 1  . rosuvastatin (CRESTOR) 5 MG tablet Take 1 tablet (5 mg total) by  mouth daily. 90 tablet 3   No current facility-administered medications on file prior to visit.    Review of Systems  All otherwise neg per pt       Objective:   Physical Exam BP 130/70   Pulse 76   Temp 98.4 F (36.9 C) (Oral)   Resp 20   Wt 168 lb 4 oz (76.3 kg)   SpO2 95%   BMI 28.00 kg/m  VS noted, mild ill Constitutional: Pt appears in no apparent distress HENT: Head: NCAT.  Right Ear: External ear normal. Right TM with severe erythema, mild bulging, without canal swelling or d/c Left Ear: External ear normal. left TM clear without erythema Eyes: . Pupils are equal, round, and reactive to light. Conjunctivae and EOM are normal Neck: Normal range of motion. Neck supple.  Cardiovascular: Normal rate and regular rhythm.   Pulmonary/Chest: Effort normal and breath sounds without rales or wheezing.  Neurological: Pt is alert. Not confused , motor grossly intact Skin: Skin is warm. No rash, no LE edema Psychiatric: Pt behavior is normal. No agitation.     Assessment & Plan:

## 2016-01-10 ENCOUNTER — Ambulatory Visit (INDEPENDENT_AMBULATORY_CARE_PROVIDER_SITE_OTHER): Payer: Managed Care, Other (non HMO) | Admitting: Internal Medicine

## 2016-01-10 ENCOUNTER — Encounter: Payer: Self-pay | Admitting: Internal Medicine

## 2016-01-10 DIAGNOSIS — J329 Chronic sinusitis, unspecified: Secondary | ICD-10-CM

## 2016-01-10 MED ORDER — AMOXICILLIN-POT CLAVULANATE 875-125 MG PO TABS: 1.0000 | ORAL_TABLET | Freq: Two times a day (BID) | ORAL | 0 refills | Status: DC

## 2016-01-10 NOTE — Patient Instructions (Signed)
We have sent in the augmentin to the pharmacy. Take 1 pill twice a day for the next week.   Start taking the zyrtec again as well for the congestion.

## 2016-01-10 NOTE — Progress Notes (Signed)
   Subjective:    Patient ID: Kyle LennertJason C Griffin, male    DOB: 06-23-1965, 50 y.o.   MRN: [id number]  HPI The patient is a 50 YO man coming in for left ear pain and sinus headaches for the last 1-2 weeks. He denies fevers but is having some chills. Has had several bouts of this so far this fall and usually is only able to go 2-3 weeks between episodes. He is not taking zyrtec regularly right now. Nose drainage and sore throat. Minimal cough without sputum. Has tried OTC cold products without relief.   Review of Systems  Constitutional: Positive for appetite change and chills. Negative for activity change, fatigue, fever and unexpected weight change.  HENT: Positive for congestion, ear discharge, ear pain, postnasal drip, rhinorrhea, sinus pain, sinus pressure and sore throat. Negative for trouble swallowing.   Eyes: Negative.   Respiratory: Positive for cough. Negative for chest tightness, shortness of breath and wheezing.        Minimal  Cardiovascular: Negative.   Gastrointestinal: Negative.   Musculoskeletal: Negative.   Neurological: Positive for headaches.      Objective:   Physical Exam  Constitutional: He is oriented to person, place, and time. He appears well-developed and well-nourished.  HENT:  Head: Normocephalic and atraumatic.  Frontal pain, ears with redness in the canal, nose with yellow crusting.  Eyes: EOM are normal.  Neck: Normal range of motion.  Some shotty tender LAD   Cardiovascular: Normal rate and regular rhythm.   Pulmonary/Chest: Effort normal and breath sounds normal. No respiratory distress. He has no wheezes. He has no rales. He exhibits no tenderness.  Abdominal: Soft. He exhibits no distension. There is no tenderness. There is no rebound.  Musculoskeletal: He exhibits no edema.  Lymphadenopathy:    He has cervical adenopathy.  Neurological: He is alert and oriented to person, place, and time. Coordination normal.  Skin: Skin is warm and dry.    Vitals:   01/10/16 1602  BP: 136/76  Pulse: 98  Resp: 12  Temp: 99.4 F (37.4 C)  TempSrc: Oral  SpO2: 96%  Weight: 171 lb (77.6 kg)  Height: 5\' 5"  (1.651 m)      Assessment & Plan:

## 2016-01-10 NOTE — Progress Notes (Signed)
Pre visit review using our clinic review tool, if applicable. No additional management support is needed unless otherwise documented below in the visit note. 

## 2016-01-11 NOTE — Assessment & Plan Note (Signed)
Rx for augmentin for 1 week and advised to start taking zyrtec and continue this medication. Also discussed flonase but he does not want to try this yet.

## 2016-01-25 ENCOUNTER — Encounter: Payer: Self-pay | Admitting: Family Medicine

## 2016-01-25 ENCOUNTER — Ambulatory Visit (INDEPENDENT_AMBULATORY_CARE_PROVIDER_SITE_OTHER): Payer: Managed Care, Other (non HMO) | Admitting: Family Medicine

## 2016-01-25 VITALS — BP 115/75 | HR 106 | Temp 98.9°F | Resp 20 | Wt 165.0 lb

## 2016-01-25 DIAGNOSIS — R1030 Lower abdominal pain, unspecified: Secondary | ICD-10-CM

## 2016-01-25 DIAGNOSIS — R319 Hematuria, unspecified: Secondary | ICD-10-CM

## 2016-01-25 DIAGNOSIS — R8290 Unspecified abnormal findings in urine: Secondary | ICD-10-CM | POA: Diagnosis not present

## 2016-01-25 DIAGNOSIS — Z87442 Personal history of urinary calculi: Secondary | ICD-10-CM | POA: Diagnosis not present

## 2016-01-25 LAB — CBC WITH DIFFERENTIAL/PLATELET
Basophils Absolute: 0 cells/uL (ref 0–200)
Basophils Relative: 0 %
Eosinophils Absolute: 74 cells/uL (ref 15–500)
Eosinophils Relative: 1 %
HCT: 43.5 % (ref 38.5–50.0)
Hemoglobin: 15.3 g/dL (ref 13.2–17.1)
Lymphocytes Relative: 24 %
Lymphs Abs: 1776 cells/uL (ref 850–3900)
MCH: 29.9 pg (ref 27.0–33.0)
MCHC: 35.2 g/dL (ref 32.0–36.0)
MCV: 85.1 fL (ref 80.0–100.0)
MPV: 8.9 fL (ref 7.5–12.5)
Monocytes Absolute: 814 cells/uL (ref 200–950)
Monocytes Relative: 11 %
Neutro Abs: 4736 cells/uL (ref 1500–7800)
Neutrophils Relative %: 64 %
Platelets: 228 10*3/uL (ref 140–400)
RBC: 5.11 MIL/uL (ref 4.20–5.80)
RDW: 13.3 % (ref 11.0–15.0)
WBC: 7.4 10*3/uL (ref 3.8–10.8)

## 2016-01-25 LAB — POC URINALSYSI DIPSTICK (AUTOMATED)
Bilirubin, UA: NEGATIVE
Glucose, UA: NEGATIVE
Leukocytes, UA: NEGATIVE
Nitrite, UA: NEGATIVE
Protein, UA: NEGATIVE
Spec Grav, UA: 1.025
Urobilinogen, UA: 0.2
pH, UA: 5.5

## 2016-01-25 LAB — BASIC METABOLIC PANEL WITH GFR
BUN: 14 mg/dL (ref 7–25)
CO2: 25 mmol/L (ref 20–31)
Calcium: 9.4 mg/dL (ref 8.6–10.3)
Chloride: 103 mmol/L (ref 98–110)
Creat: 1.38 mg/dL — ABNORMAL HIGH (ref 0.70–1.33)
GFR, Est African American: 67 mL/min (ref >=60)
GFR, Est Non African American: 58 mL/min — ABNORMAL LOW (ref >=60)
Glucose, Bld: 102 mg/dL — ABNORMAL HIGH (ref 65–99)
Potassium: 3.8 mmol/L (ref 3.5–5.3)
Sodium: 138 mmol/L (ref 135–146)

## 2016-01-25 MED ORDER — NAPROXEN 500 MG PO TABS: 500.0000 mg | ORAL_TABLET | Freq: Two times a day (BID) | ORAL | 0 refills | Status: DC

## 2016-01-25 MED ORDER — TRAMADOL HCL 50 MG PO TABS: 50.0000 mg | ORAL_TABLET | Freq: Three times a day (TID) | ORAL | 0 refills | Status: DC | PRN

## 2016-01-25 MED ORDER — KETOROLAC TROMETHAMINE 60 MG/2ML IM SOLN
60.0000 mg | Freq: Once | INTRAMUSCULAR | Status: AC
  Administered 2016-01-25: 60 mg via INTRAMUSCULAR

## 2016-01-25 NOTE — Progress Notes (Signed)
Kyle LennertJason C Skirvin , Aug 02, 1965, 50 y.o., male MRN: [id number] Patient Care Team    Relationship Specialty Notifications Start End  Tresa GarterAleksei V Plotnikov, MD PCP - General   02/02/10     CC: hematuria  Subjective: Pt presents for an acute OV with complaints of hematuria of 1 day duration.  Associated symptoms include lower back pain, dull aching across lower back without radiation and lower abd/groin pain. Pt reports a h/o kidney stones, last stone ~2013. He reports he was able to pass the stone with pain mediations only. His CT at that time proved to have multiple bilateral renal calculi largest in right kidney 4.5 mm, with mild hydronephrosis/ureter. A stone was within the urinary bladder at  the right UVJ 2.225mm. Pt reports full resolution of that event and feels his pain today is similar. He reports low back pain started yesterday, and was mild. However this morning his pain had increased and he was have "groin" pain, which he reports as lower abd pain radiating to his scrotum and endorsed bloody urination. He endorses weak urinary stream, but not needing to strain. He does not endorse decrease urinary output. He denies fever, discharge, nausea, vomit or bowel changes.  Pt has tried tylenol to ease their symptoms.   Allergies  Allergen Reactions  . Oxycodone-Acetaminophen Hives and Itching   Social History  Substance Use Topics  . Smoking status: Former Smoker    Quit date: 12/23/1999  . Smokeless tobacco: Former NeurosurgeonUser  . Alcohol use No   Past Medical History:  Diagnosis Date  . Allergic rhinitis   . Asthma, exercise induced   . GERD (gastroesophageal reflux disease)   . Gilbert disease   . Hyperlipidemia   . IBS (irritable bowel syndrome)   . Nephrolithiasis   . Renal stones    Past Surgical History:  Procedure Laterality Date  . FOOT SURGERY     Cyst, Heel  . ROTATOR CUFF REPAIR     Family History  Problem Relation Age of Onset  . Coronary artery disease Mother   .  Stroke Mother   . Heart disease Mother   . Hyperlipidemia Mother   . Cancer Mother [id number]    lung ca  . Heart disease Father   . Hyperlipidemia Father   . Diabetes Neg Hx      Medication List       Accurate as of 01/25/16  2:39 PM. Always use your most recent med list.          cetirizine 10 MG tablet Commonly known as:  ZYRTEC Take 10 mg by mouth daily as needed. allergies   famotidine 20 MG tablet Commonly known as:  PEPCID Take 1 tablet (20 mg total) by mouth daily. ulcers   meclizine 12.5 MG tablet Commonly known as:  ANTIVERT Take 1 tablet (12.5 mg total) by mouth 3 (three) times daily as needed for dizziness.   rosuvastatin 5 MG tablet Commonly known as:  CRESTOR Take 1 tablet (5 mg total) by mouth daily.   traMADol 50 MG tablet Commonly known as:  ULTRAM Take 1 tablet (50 mg total) by mouth every 8 (eight) hours as needed.       Results for orders placed or performed in visit on 01/25/16 (from the past 24 hour(s))  POCT Urinalysis Dipstick (Automated)     Status: None   Collection Time: 01/25/16  2:35 PM  Result Value Ref Range   Color, UA yellow    Clarity, UA clear  Glucose, UA negative    Bilirubin, UA negative    Ketones, UA trace    Spec Grav, UA 1.025    Blood, UA moderate    pH, UA 5.5    Protein, UA negative    Urobilinogen, UA 0.2    Nitrite, UA negative    Leukocytes, UA Negative Negative   No results found.   ROS: Negative, with the exception of above mentioned in HPI   Objective:  BP 115/75 (BP Location: Right Arm, Patient Position: Sitting, Cuff Size: Large)   Pulse (!) 106   Temp 98.9 F (37.2 C)   Resp 20   Wt 165 lb (74.8 kg)   SpO2 98%   BMI 27.46 kg/m  Body mass index is 27.46 kg/m. Gen: Afebrile. No acute distress. Nontoxic in appearance, well developed, well nourished.  HENT: AT. [place]. MMM, no oral lesions.  Eyes:Pupils Equal Round Reactive to light, Extraocular movements intact,  Conjunctiva without redness,  discharge or icterus. CV: tachycardia resolved by exam, no murmur appreciated.  Chest: CTAB, no wheeze or crackles. Good air movement, normal resp effort.  Abd: Soft. Suprapubic tenderness, RLQ and LLQ deep palpation tenderness. ND. BS hypoactive. No  Masses palpated. No rebound or guarding.  MSK: No CVA tenderness.  Neuro: Normal gait. PERLA. EOMi. Alert. Oriented x3  Male genitalia: not done no penile lesions or discharge no testicular masses Penis: normal, no lesions Urethral Meatus: normal and no discharge Testicles: normal, no masses and retractile: easily able to slide testicle into scrotum without discomfort.  Scrotum: normal Epididymis: normal and no tenderness Bladder: mild bladder distention noted.   Results for orders placed or performed in visit on 01/25/16 (from the past 48 hour(s))  POCT Urinalysis Dipstick (Automated)     Status: None   Collection Time: 01/25/16  2:35 PM  Result Value Ref Range   Color, UA yellow    Clarity, UA clear    Glucose, UA negative    Bilirubin, UA negative    Ketones, UA trace    Spec Grav, UA 1.025    Blood, UA moderate    pH, UA 5.5    Protein, UA negative    Urobilinogen, UA 0.2    Nitrite, UA negative    Leukocytes, UA Negative Negative    Assessment/Plan: Kyle LennertJason C Aguinaga is a 50 y.o. male present for acute OV for  Abnormal urine/Hematuria, unspecified type Lower abdominal pain History of kidney stones - rest and hydrate.  - discussed with pt the course of  outpatient treatment for probable kidney stone. Pt was given a toradol injection in office to help with the pain, and prescribed Naproxen scheduled next few days to help with both mild to moderate pain and inflammation (start tomorrow). Tramadol prescribed for moderate to severe pain. Ct renal study to be scheduled, depending on results consider flomax and/or urology referral. Pt is strain all urine, strainer provided.  - POCT Urinalysis Dipstick (Automated): moderate blood.   - Urinalysis, Routine w reflex microscopic - BASIC METABOLIC PANEL WITH GFR - CBC w/Diff - CT RENAL STONE STUDY; Future - ketorolac (TORADOL) injection 60 mg; Inject 2 mLs (60 mg total) into the muscle once. - return precautions/red flags discussed with pt on when to be seen urgently, pt is to follow up in 2-3 days, sooner if condition is worsening.    electronically signed by:  Felix Pacinienee Kuneff, DO  [facility] - [place]

## 2016-01-25 NOTE — Patient Instructions (Addendum)
I have given you a shot of Toradol today to help with pain. Naproxen for antiinflammatory and moderate pain start tomorrow. The shot is the same medicine  Tramadol script provided for severe pain as well. You can start this today if needed.  I will collect labs to check kidney function and ordered a CT renal study to look for kidney stones.   Drink plenty of water and strain urine.  If urine amount decreases or you are straining more, increase pain etc, then be seen immediately in ED.   F/U Friday , sooner if worsening.     Kidney Stones Kidney stones (urolithiasis) are rock-like masses that form inside of the kidneys. Kidneys are organs that make pee (urine). A kidney stone can cause very bad pain and can block the flow of pee. The stone usually leaves your body (passes) through your pee. You may need to have a doctor take out the stone. Follow these instructions at home: Eating and drinking  Drink enough fluid to keep your pee clear or pale yellow. This will help you pass the stone.  If told by your doctor, change the foods you eat (your diet). This may include:  Limiting how much salt (sodium) you eat.  Eating more fruits and vegetables.  Limiting how much meat, poultry, fish, and eggs you eat.  Follow instructions from your doctor about eating or drinking restrictions. General instructions  Collect pee samples as told by your doctor. You may need to collect a pee sample:  24 hours after a stone comes out.  8-12 weeks after a stone comes out, and every 6-12 months after that.  Strain your pee every time you pee (urinate), for as long as told. Use the strainer that your doctor recommends.  Do not throw out the stone. Keep it so that it can be tested by your doctor.  Take over-the-counter and prescription medicines only as told by your doctor.  Keep all follow-up visits as told by your doctor. This is important. You may need follow-up tests. Preventing kidney stones To  prevent another kidney stone:  Drink enough fluid to keep your pee clear or pale yellow. This is the best way to prevent kidney stones.  Eat healthy foods.  Avoid certain foods as told by your doctor. You may be told to eat less protein.  Stay at a healthy weight. Contact a doctor if:  You have pain that gets worse or does not get better with medicine. Get help right away if:  You have a fever or chills.  You get very bad pain.  You get new pain in your belly (abdomen).  You pass out (faint).  You cannot pee. This information is not intended to replace advice given to you by your health care provider. Make sure you discuss any questions you have with your health care provider. Document Released: 07/26/2007 Document Revised: 10/26/2015 Document Reviewed: 10/26/2015 Elsevier Interactive Patient Education  2017 [employer]

## 2016-01-26 ENCOUNTER — Telehealth: Payer: Self-pay | Admitting: Family Medicine

## 2016-01-26 LAB — URINALYSIS, ROUTINE W REFLEX MICROSCOPIC
Bilirubin Urine: NEGATIVE
Glucose, UA: NEGATIVE
Leukocytes, UA: NEGATIVE
Nitrite: NEGATIVE
Protein, ur: NEGATIVE
Specific Gravity, Urine: 1.02 (ref 1.001–1.035)
pH: 5 (ref 5.0–8.0)

## 2016-01-26 LAB — URINALYSIS, MICROSCOPIC ONLY
Bacteria, UA: NONE SEEN [HPF]
Casts: NONE SEEN [LPF]
Crystals: NONE SEEN [HPF]
Squamous Epithelial / LPF: NONE SEEN [HPF] (ref <=5)
WBC, UA: NONE SEEN WBC/HPF (ref <=5)
Yeast: NONE SEEN [HPF]

## 2016-01-26 NOTE — Telephone Encounter (Signed)
Please call pt: - his CT showed multiple but small bilateral kidney stones. This is consistent with prior CT scan.  - his labs resulted with blood in his urine and mild changes in kidney function.  - please make certain he is drinking plenty of water and straining his urine. Please ask if his symptoms are improving or worsening. I do want to see him before the weekend for follow up, sooner if worsening. If he has not improved, passed a stone etc, we may need to look in other causes such as prostate issues. If he is having decreased urine, or increasing weak stream I would want to try flomax, before seeing him.  Please advise.

## 2016-01-26 NOTE — Telephone Encounter (Signed)
Patient returned call reviewed results scheduled patient to be seen Friday 01/28/16

## 2016-01-26 NOTE — Telephone Encounter (Signed)
Left message for patient to return call.

## 2016-01-27 ENCOUNTER — Ambulatory Visit (INDEPENDENT_AMBULATORY_CARE_PROVIDER_SITE_OTHER): Payer: Managed Care, Other (non HMO) | Admitting: Internal Medicine

## 2016-01-27 ENCOUNTER — Encounter: Payer: Self-pay | Admitting: Internal Medicine

## 2016-01-27 VITALS — BP 138/78 | HR 73 | Resp 20 | Wt 169.0 lb

## 2016-01-27 DIAGNOSIS — G4700 Insomnia, unspecified: Secondary | ICD-10-CM | POA: Diagnosis not present

## 2016-01-27 DIAGNOSIS — F329 Major depressive disorder, single episode, unspecified: Secondary | ICD-10-CM | POA: Diagnosis not present

## 2016-01-27 MED ORDER — ZOLPIDEM TARTRATE 10 MG PO TABS: 10.0000 mg | ORAL_TABLET | Freq: Every evening | ORAL | 0 refills | Status: DC | PRN

## 2016-01-27 MED ORDER — ESCITALOPRAM OXALATE 10 MG PO TABS: 10.0000 mg | ORAL_TABLET | Freq: Every day | ORAL | 5 refills | Status: DC

## 2016-01-27 NOTE — Assessment & Plan Note (Signed)
Mild to mod, denies SI or HI, for lexapro 10 qd, refer counseling, to f/u any worsening symptoms or concerns

## 2016-01-27 NOTE — Progress Notes (Signed)
Subjective:    Patient ID: Kyle Griffin, male    DOB: 21-May-1965, 50 y.o.   MRN: [id number]  HPI  Here with c/o depressive symptoms with feeling down, no enjoyment, not looking forward to things, sleeping more except at night has persistent insomnia and cant get to sleep, wt stable, and denies SI or HI.   Wt Readings from Last 3 Encounters:  01/27/16 169 lb (76.7 kg)  01/25/16 165 lb (74.8 kg)  01/10/16 171 lb (77.6 kg)  Has some anxiety as well, has reached out to work counseling but not yet seen.  Having significant marital issues, wife may be amenable to counseling but he wishes to pursue counseling for himself first.  Has hx of situational depression mild but has not required SSRI or other medical tx.    Pt denies chest pain, increased sob or doe, wheezing, orthopnea, PND, increased LE swelling, palpitations, dizziness or syncope.  Pt denies new neurological symptoms such as new headache, or facial or extremity weakness or numbness  Pt denies polydipsia, polyuria No other new history . Denies hyper or hypo thyroid symptoms such as voice, skin or hair change. Past Medical History:  Diagnosis Date  . Allergic rhinitis   . Asthma, exercise induced   . GERD (gastroesophageal reflux disease)   . Gilbert disease   . Hyperlipidemia   . IBS (irritable bowel syndrome)   . Nephrolithiasis   . Renal stones    Past Surgical History:  Procedure Laterality Date  . FOOT SURGERY     Cyst, Heel  . ROTATOR CUFF REPAIR      reports that he quit smoking about 16 years ago. He has quit using smokeless tobacco. He reports that he does not drink alcohol or use drugs. family history includes Cancer (age of onset: 5282) in his mother; Coronary artery disease in his mother; Heart disease in his father and mother; Hyperlipidemia in his father and mother; Stroke in his mother. Allergies  Allergen Reactions  . Oxycodone-Acetaminophen Hives and Itching   Current Outpatient Prescriptions on File Prior to  Visit  Medication Sig Dispense Refill  . cetirizine (ZYRTEC) 10 MG tablet Take 10 mg by mouth daily as needed. allergies    . famotidine (PEPCID) 20 MG tablet Take 1 tablet (20 mg total) by mouth daily. ulcers 30 tablet 2  . meclizine (ANTIVERT) 12.5 MG tablet Take 1 tablet (12.5 mg total) by mouth 3 (three) times daily as needed for dizziness. (Patient not taking: Reported on 01/25/2016) 60 tablet 1  . naproxen (NAPROSYN) 500 MG tablet Take 1 tablet (500 mg total) by mouth 2 (two) times daily with a meal. 30 tablet 0  . rosuvastatin (CRESTOR) 5 MG tablet Take 1 tablet (5 mg total) by mouth daily. (Patient not taking: Reported on 01/25/2016) 90 tablet 3  . traMADol (ULTRAM) 50 MG tablet Take 1 tablet (50 mg total) by mouth every 8 (eight) hours as needed. 60 tablet 0   No current facility-administered medications on file prior to visit.    Review of Systems  All other system neg per pt    Objective:   Physical Exam BP 138/78   Pulse 73   Resp 20   Wt 169 lb (76.7 kg)   SpO2 96%   BMI 28.12 kg/m  VS noted, not ill appearing Constitutional: Pt appears in no apparent distress HENT: Head: NCAT.  Right Ear: External ear normal.  Left Ear: External ear normal.  Eyes: . Pupils are equal, round,  and reactive to light. Conjunctivae and EOM are normal Neck: Normal range of motion. Neck supple.  Cardiovascular: Normal rate and regular rhythm.   Pulmonary/Chest: Effort normal and breath sounds without rales or wheezing.  Neurological: Pt is alert. Not confused , motor grossly intact Skin: Skin is warm. No rash, no LE edema Psychiatric: Pt behavior is normal. No agitation. + depressed affect  TSH  Order: [id number]  Status:  Final result Visible to patient:  No (Not Released) Next appt:  01/28/2016 at 02:45 PM in Family [facility](Renee Kuneff, DO) Dx:  Routine general medical examination a...    Ref Range & Units 5872yr ago 543yr ago 4466yr ago 6232yr ago 567yr ago   TSH 0.35 - 5.50 uIU/mL 4.18  4.33   3.33R  3.12R  0.46R            Assessment & Plan:

## 2016-01-27 NOTE — Progress Notes (Signed)
Pre visit review using our clinic review tool, if applicable. No additional management support is needed unless otherwise documented below in the visit note. 

## 2016-01-27 NOTE — Assessment & Plan Note (Signed)
Mild to mod, for ambien qhs prn limited rx, to f/u any worsening symptoms or concerns

## 2016-01-27 NOTE — Patient Instructions (Signed)
Please take all new medication as prescribed - the lexapro 10 mg per day, and the ambien at bedtime for sleep if needed  You will be contacted regarding the referral for: Psychology  Please continue all other medications as before, and refills have been done if requested.  Please have the pharmacy call with any other refills you may need.  Please keep your appointments with your specialists as you may have planned

## 2016-01-28 ENCOUNTER — Encounter: Payer: Self-pay | Admitting: Family Medicine

## 2016-01-28 ENCOUNTER — Ambulatory Visit (INDEPENDENT_AMBULATORY_CARE_PROVIDER_SITE_OTHER): Payer: Managed Care, Other (non HMO) | Admitting: Family Medicine

## 2016-01-28 VITALS — BP 116/75 | HR 76 | Temp 98.7°F | Resp 20 | Ht 65.0 in | Wt 169.5 lb

## 2016-01-28 DIAGNOSIS — N200 Calculus of kidney: Secondary | ICD-10-CM | POA: Diagnosis not present

## 2016-01-28 NOTE — Progress Notes (Signed)
Kyle Griffin , 11/07/1965, 50 y.o., male MRN: [id number] Patient Care Team    Relationship Specialty Notifications Start End  Kyle GarterAleksei V Plotnikov, MD PCP - General   02/02/10      Subjective:  Pt present to follow up abd pain for probable kidney stone. Labs and CT renal study discussed with pt today and he was provided with a copy of his report. He feels his symptoms are much improved. He denies any fever, chills, nausea or bowel changes. The bladder pressure and abd pain he had experienced has resolved. CT study did not show signs of obstructing stone of kidney, ureter or bladder. He does have multiple small kidney stones bilaterally. He reports feeling improved after Toradol shot in the office and not using the naproxen. He has strained some of his urine, but not every time. He does not feel he passed a stone, or at least not aware of it.   CT renal study needed to be obtained through [facility] secondary to his insurance. Imaging completed 01/26/2016. Original report is scanned into media. Impression: Bilateral nonobstructive nephrolithiasis. No evidence of ureteral calculi or hydronephrosis. No radiographic evidence of urinary tract carcinoma. Moderate to severe hepatic steatosis.   Allergies  Allergen Reactions  . Oxycodone-Acetaminophen Hives and Itching   Social History  Substance Use Topics  . Smoking status: Former Smoker    Quit date: 12/23/1999  . Smokeless tobacco: Former NeurosurgeonUser  . Alcohol use No   Past Medical History:  Diagnosis Date  . Allergic rhinitis   . Asthma, exercise induced   . GERD (gastroesophageal reflux disease)   . Gilbert disease   . Hyperlipidemia   . IBS (irritable bowel syndrome)   . Nephrolithiasis   . Renal stones    Past Surgical History:  Procedure Laterality Date  . FOOT SURGERY     Cyst, Heel  . ROTATOR CUFF REPAIR     Family History  Problem Relation Age of Onset  . Coronary artery disease Mother   . Stroke Mother     . Heart disease Mother   . Hyperlipidemia Mother   . Cancer Mother [id number]    lung ca  . Heart disease Father   . Hyperlipidemia Father   . Diabetes Neg Hx      Medication List       Accurate as of 01/28/16  3:12 PM. Always use your most recent med list.          cetirizine 10 MG tablet Commonly known as:  ZYRTEC Take 10 mg by mouth daily as needed. allergies   escitalopram 10 MG tablet Commonly known as:  LEXAPRO Take 1 tablet (10 mg total) by mouth daily.   famotidine 20 MG tablet Commonly known as:  PEPCID Take 1 tablet (20 mg total) by mouth daily. ulcers   meclizine 12.5 MG tablet Commonly known as:  ANTIVERT Take 1 tablet (12.5 mg total) by mouth 3 (three) times daily as needed for dizziness.   naproxen 500 MG tablet Commonly known as:  NAPROSYN Take 1 tablet (500 mg total) by mouth 2 (two) times daily with a meal.   rosuvastatin 5 MG tablet Commonly known as:  CRESTOR Take 1 tablet (5 mg total) by mouth daily.   traMADol 50 MG tablet Commonly known as:  ULTRAM Take 1 tablet (50 mg total) by mouth every 8 (eight) hours as needed.   zolpidem 10 MG tablet Commonly known as:  AMBIEN Take 1 tablet (10 mg total)  by mouth at bedtime as needed for sleep.       No results found for this or any previous visit (from the past 24 hour(s)). No results found.   ROS: Negative, with the exception of above mentioned in HPI   Objective:  BP 116/75 (BP Location: Left Arm, Patient Position: Sitting, Cuff Size: Large)   Pulse 76   Temp 98.7 F (37.1 C)   Resp 20   Ht 5\' 5"  (1.651 m)   Wt 169 lb 8 oz (76.9 kg)   SpO2 98%   BMI 28.21 kg/m  Body mass index is 28.21 kg/m.  Gen: Afebrile. No acute distress. Nontoxic in appearance, well developed, well nourished.  HENT: AT. [place]. MMM Eyes:Pupils Equal Round Reactive to light, Extraocular movements intact,  Conjunctiva without redness, discharge or icterus. Abd: Soft. round. Mild ttp deep LLQ. ND. BS present. no Masses  palpated. No rebound or guarding.  MSK: no cva tenderness.    Assessment/Plan: Kyle Griffin is a [age] y.o. male present for acute OV for  Nephrolithiasis - Patient appears to be improving. Pain likely caused from kidney stone. Discussed with him today his resolution of symptoms is partially reassuring. A CT study is also reassuring. Reviewed in detail his CT report, and provided him with a copy today. Mild concern for prostate/BPH, however patient feels his symptoms have greatly improved. Deferred GU. States he has had mild prostate issues/BPH and was provided a medication, which he states he doesn't feel was helpful. - Patient still has some mild tenderness to deep palpation left lower quadrant. Have asked him to monitor this, consider scheduling his screening colonoscopy since it is due and he has a gastroenterologist. If pain worsens he should be seen. He currently does not feel this is much of an issue.  Hepatic steatosis: - Briefly discussed hepatic steatosis, diagnosis and causes. AVS information was provided on fatty liver disease also. Patient was advised to be cautious with alcohol consumption.   electronically signed by:  Felix Pacinienee Kuneff, DO  [facility] - [place]

## 2016-01-28 NOTE — Patient Instructions (Signed)
I am glad you are improving. You likely passed a kidney stone. You do have more stones in your kidneys that have a potential of passing in the future as well.  As long as you continue to improve, no need to follow up.  I would recommend you have your colonoscopy completed (was due at 48).  Just a little information on fatty liver below.    Fatty Liver Introduction Fatty liver, also called hepatic steatosis or steatohepatitis, is a condition in which too much fat has built up in your liver cells. The liver removes harmful substances from your bloodstream. It produces fluids your body needs. It also helps your body use and store energy from the food you eat. In many cases, fatty liver does not cause symptoms or problems. It is often diagnosed when tests are being done for other reasons. However, over time, fatty liver can cause inflammation that may lead to more serious liver problems, such as scarring of the liver (cirrhosis). What are the causes? Causes of fatty liver may include:  Drinking too much alcohol.  Poor nutrition.  Obesity.  Cushing syndrome.  Diabetes.  Hyperlipidemia.  Pregnancy.  Certain drugs.  Poisons.  Some viral infections. What increases the risk? You may be more likely to develop fatty liver if you:  Abuse alcohol.  Are pregnant.  Are overweight.  Have diabetes.  Have hepatitis.  Have a high triglyceride level. What are the signs or symptoms? Fatty liver often does not cause any symptoms. In cases where symptoms develop, they can include:  Fatigue.  Weakness.  Weight loss.  Confusion.  Abdominal pain.  Yellowing of your skin and the white parts of your eyes (jaundice).  Nausea and vomiting. How is this diagnosed? Fatty liver may be diagnosed by:  Physical exam and medical history.  Blood tests.  Imaging tests, such as an ultrasound, CT scan, or MRI.  Liver biopsy. A small sample of liver tissue is removed using a needle. The  sample is then looked at under a microscope. How is this treated? Fatty liver is often caused by other health conditions. Treatment for fatty liver may involve medicines and lifestyle changes to manage conditions such as:  Alcoholism.  High cholesterol.  Diabetes.  Being overweight or obese. Follow these instructions at home:  Eat a healthy diet as directed by your health care provider.  Exercise regularly. This can help you lose weight and control your cholesterol and diabetes. Talk to your health care provider about an exercise plan and which activities are best for you.  Do not drink alcohol.  Take medicines only as directed by your health care provider. Contact a health care provider if: You have difficulty controlling your:  Blood sugar.  Cholesterol.  Alcohol consumption. Get help right away if:  You have abdominal pain.  You have jaundice.  You have nausea and vomiting. This information is not intended to replace advice given to you by your health care provider. Make sure you discuss any questions you have with your health care provider. Document Released: 03/24/2005 Document Revised: 07/15/2015 Document Reviewed: 06/18/2013  2017 Elsevier

## 2016-02-01 DIAGNOSIS — H66013 Acute suppurative otitis media with spontaneous rupture of ear drum, bilateral: Secondary | ICD-10-CM | POA: Insufficient documentation

## 2016-04-19 ENCOUNTER — Telehealth: Payer: Self-pay | Admitting: *Deleted

## 2016-04-19 MED ORDER — ESCITALOPRAM OXALATE 10 MG PO TABS: 10.0000 mg | ORAL_TABLET | Freq: Every day | ORAL | 2 refills | Status: DC

## 2016-04-19 NOTE — Telephone Encounter (Signed)
Pt wife called nad is requesting a 90 day script on pt [facility]op[facility]. Sent electronically...Raechel Chute/lmb

## 2016-05-02 ENCOUNTER — Ambulatory Visit (INDEPENDENT_AMBULATORY_CARE_PROVIDER_SITE_OTHER): Payer: Managed Care, Other (non HMO) | Admitting: Internal Medicine

## 2016-05-02 DIAGNOSIS — J019 Acute sinusitis, unspecified: Secondary | ICD-10-CM

## 2016-05-02 DIAGNOSIS — R739 Hyperglycemia, unspecified: Secondary | ICD-10-CM | POA: Diagnosis not present

## 2016-05-02 MED ORDER — AZITHROMYCIN 250 MG PO TABS: ORAL_TABLET | ORAL | 1 refills | Status: DC

## 2016-05-02 NOTE — Progress Notes (Signed)
Subjective:    Patient ID: Kyle Griffin, male    DOB: [birth year], 51 y.o.   MRN: [id number]  HPI   Here with 2-3 days acute onset fever, facial pain, pressure, headache, general weakness and malaise, and greenish d/c, with mild ST and cough, but pt denies chest pain, wheezing, increased sob or doe, orthopnea, PND, increased LE swelling, palpitations, dizziness or syncope.  Pt denies new neurological symptoms such as new headache, or facial or extremity weakness or numbness   Pt denies polydipsia, polyuria Past Medical History:  Diagnosis Date  . Allergic rhinitis   . Asthma, exercise induced   . GERD (gastroesophageal reflux disease)   . Gilbert disease   . Hyperlipidemia   . IBS (irritable bowel syndrome)   . Nephrolithiasis   . Renal stones    Past Surgical History:  Procedure Laterality Date  . FOOT SURGERY     Cyst, Heel  . ROTATOR CUFF REPAIR      reports that he quit smoking about 16 years ago. He has quit using smokeless tobacco. He reports that he does not drink alcohol or use drugs. family history includes Cancer (age of onset: 75) in his mother; Coronary artery disease in his mother; Heart disease in his father and mother; Hyperlipidemia in his father and mother; Stroke in his mother. Allergies  Allergen Reactions  . Oxycodone-Acetaminophen Hives and Itching   Current Outpatient Prescriptions on File Prior to Visit  Medication Sig Dispense Refill  . cetirizine (ZYRTEC) 10 MG tablet Take 10 mg by mouth daily as needed. allergies    . escitalopram (LEXAPRO) 10 MG tablet Take 1 tablet (10 mg total) by mouth daily. 90 tablet 2  . famotidine (PEPCID) 20 MG tablet Take 1 tablet (20 mg total) by mouth daily. ulcers 30 tablet 2  . meclizine (ANTIVERT) 12.5 MG tablet Take 1 tablet (12.5 mg total) by mouth 3 (three) times daily as needed for dizziness. (Patient not taking: Reported on 01/28/2016) 60 tablet 1  . naproxen (NAPROSYN) 500 MG tablet Take 1 tablet (500 mg total) by  mouth 2 (two) times daily with a meal. 30 tablet 0  . rosuvastatin (CRESTOR) 5 MG tablet Take 1 tablet (5 mg total) by mouth daily. (Patient not taking: Reported on 01/28/2016) 90 tablet 3  . traMADol (ULTRAM) 50 MG tablet Take 1 tablet (50 mg total) by mouth every 8 (eight) hours as needed. 60 tablet 0  . zolpidem (AMBIEN) 10 MG tablet Take 1 tablet (10 mg total) by mouth at bedtime as needed for sleep. 30 tablet 0   No current facility-administered medications on file prior to visit.    Review of Systems All otherwise neg per pt    Objective:   Physical Exam BP (!) 138/92 (BP Location: Left Arm, Patient Position: Sitting, Cuff Size: Normal)   Pulse 62   Temp 98.2 F (36.8 C) (Oral)   Ht 5\' 5"  (1.651 m)   Wt 174 lb (78.9 kg)   SpO2 100%   BMI 28.96 kg/m  VS noted, midl ill Constitutional: Pt appears in no apparent distress HENT: Head: NCAT.  Right Ear: External ear normal.  Left Ear: External ear normal.  Bilat tm's with mild erythema.  Max sinus areas mild tender.  Pharynx with mild erythema, no exudate Eyes: . Pupils are equal, round, and reactive to light. Conjunctivae and EOM are normal Neck: Normal range of motion. Neck supple.  Cardiovascular: Normal rate and regular rhythm.   Pulmonary/Chest: Effort normal  and breath sounds without rales or wheezing.  Neurological: Pt is alert. Not confused , motor grossly intact Skin: Skin is warm. No rash, no LE edema Psychiatric: Pt behavior is normal. No agitation.  No other exam findings    Assessment & Plan:

## 2016-05-02 NOTE — Patient Instructions (Signed)
Please take all new medication as prescribed - the antibiotic  You can also take Delsym OTC for cough, and/or Mucinex (or it's generic off brand) for congestion, and tylenol as needed for pain.  Please continue all other medications as before, and refills have been done if requested.  Please have the pharmacy call with any other refills you may need.  Please keep your appointments with your specialists as you may have planned    

## 2016-05-02 NOTE — Progress Notes (Signed)
Pre visit review using our clinic review tool, if applicable. No additional management support is needed unless otherwise documented below in the visit note. 

## 2016-05-07 NOTE — Assessment & Plan Note (Signed)
Mild to mod, for antibx course,  to f/u any worsening symptoms or concerns 

## 2016-05-07 NOTE — Assessment & Plan Note (Addendum)
stable overall by history and exam, recent data reviewed with pt, and pt to continue medical treatment as before,  to f/u any worsening symptoms or concerns Lab Results  Component Value Date   HGBA1C 5.5 10/12/2010  call for onset polys or cbg > 200 with illness

## 2016-05-18 ENCOUNTER — Ambulatory Visit (INDEPENDENT_AMBULATORY_CARE_PROVIDER_SITE_OTHER): Payer: Managed Care, Other (non HMO) | Admitting: Internal Medicine

## 2016-05-18 ENCOUNTER — Encounter: Payer: Self-pay | Admitting: Internal Medicine

## 2016-05-18 DIAGNOSIS — R05 Cough: Secondary | ICD-10-CM

## 2016-05-18 DIAGNOSIS — R059 Cough, unspecified: Secondary | ICD-10-CM

## 2016-05-18 MED ORDER — LEVOFLOXACIN 500 MG PO TABS: 500.0000 mg | ORAL_TABLET | Freq: Every day | ORAL | 0 refills | Status: AC

## 2016-05-18 MED ORDER — HYDROCODONE-HOMATROPINE 5-1.5 MG/5ML PO SYRP: 5.0000 mL | ORAL_SOLUTION | Freq: Four times a day (QID) | ORAL | 0 refills | Status: AC | PRN

## 2016-05-18 NOTE — Progress Notes (Signed)
   Subjective:    Patient ID: Kyle LennertJason C Okelly, male    DOB: May 10, 1965, 51 y.o.   MRN: [id number]  HPI  Here with acute onset mild to mod 2-3 days ST, HA, general weakness and malaise, with prod cough greenish sputum, but Pt denies chest pain, increased sob or doe, wheezing, orthopnea, PND, increased LE swelling, palpitations, dizziness or syncope.   Past Medical History:  Diagnosis Date  . Allergic rhinitis   . Asthma, exercise induced   . GERD (gastroesophageal reflux disease)   . Gilbert disease   . Hyperlipidemia   . IBS (irritable bowel syndrome)   . Nephrolithiasis   . Renal stones    Past Surgical History:  Procedure Laterality Date  . FOOT SURGERY     Cyst, Heel  . ROTATOR CUFF REPAIR      reports that he quit smoking about 16 years ago. He has quit using smokeless tobacco. He reports that he does not drink alcohol or use drugs. family history includes Cancer (age of onset: 7282) in his mother; Coronary artery disease in his mother; Heart disease in his father and mother; Hyperlipidemia in his father and mother; Stroke in his mother. Allergies  Allergen Reactions  . Oxycodone-Acetaminophen Hives and Itching   Current Outpatient Prescriptions on File Prior to Visit  Medication Sig Dispense Refill  . cetirizine (ZYRTEC) 10 MG tablet Take 10 mg by mouth daily as needed. allergies    . escitalopram (LEXAPRO) 10 MG tablet Take 1 tablet (10 mg total) by mouth daily. 90 tablet 2  . famotidine (PEPCID) 20 MG tablet Take 1 tablet (20 mg total) by mouth daily. ulcers 30 tablet 2  . meclizine (ANTIVERT) 12.5 MG tablet Take 1 tablet (12.5 mg total) by mouth 3 (three) times daily as needed for dizziness. 60 tablet 1  . naproxen (NAPROSYN) 500 MG tablet Take 1 tablet (500 mg total) by mouth 2 (two) times daily with a meal. 30 tablet 0  . rosuvastatin (CRESTOR) 5 MG tablet Take 1 tablet (5 mg total) by mouth daily. 90 tablet 3  . traMADol (ULTRAM) 50 MG tablet Take 1 tablet (50 mg total)  by mouth every 8 (eight) hours as needed. 60 tablet 0  . zolpidem (AMBIEN) 10 MG tablet Take 1 tablet (10 mg total) by mouth at bedtime as needed for sleep. 30 tablet 0   No current facility-administered medications on file prior to visit.    Review of Systems All otherwise neg per pt     Objective:   Physical Exam VS noted, mild ill Constitutional: Pt appears in no apparent distress HENT: Head: NCAT.  Right Ear: External ear normal.  Left Ear: External ear normal.  Eyes: . Pupils are equal, round, and reactive to light. Conjunctivae and EOM are normal Bilat tm's with mild erythema.  Max sinus areas mild tender.  Pharynx with mild erythema, no exudate Neck: Normal range of motion. Neck supple.  Cardiovascular: Normal rate and regular rhythm.   Pulmonary/Chest: Effort normal and breath sounds decreased without rales or wheezing.  Neurological: Pt is alert. Not confused , motor grossly intact Skin: Skin is warm. No rash, no LE edema Psychiatric: Pt behavior is normal. No agitation.  No other exam findings    Assessment & Plan:

## 2016-05-18 NOTE — Progress Notes (Signed)
Pre visit review using our clinic review tool, if applicable. No additional management support is needed unless otherwise documented below in the visit note. 

## 2016-05-18 NOTE — Assessment & Plan Note (Signed)
Mild to mod, c/w possible bronchitis vs pna, declines cxr,  for antibx course, cough med prn,  to f/u any worsening symptoms or concerns

## 2016-05-18 NOTE — Patient Instructions (Signed)
Please take all new medication as prescribed - the antibiotic and cough medicine  Please continue all other medications as before, and refills have been done if requested.  Please have the pharmacy call with any other refills you may need.  Please keep your appointments with your specialists as you may have planned     

## 2016-12-23 ENCOUNTER — Other Ambulatory Visit: Payer: Self-pay | Admitting: Internal Medicine

## 2016-12-26 ENCOUNTER — Ambulatory Visit (INDEPENDENT_AMBULATORY_CARE_PROVIDER_SITE_OTHER): Payer: Managed Care, Other (non HMO) | Admitting: General Practice

## 2016-12-26 DIAGNOSIS — Z23 Encounter for immunization: Secondary | ICD-10-CM | POA: Diagnosis not present

## 2017-01-01 ENCOUNTER — Ambulatory Visit (INDEPENDENT_AMBULATORY_CARE_PROVIDER_SITE_OTHER): Payer: Managed Care, Other (non HMO) | Admitting: Internal Medicine

## 2017-01-01 ENCOUNTER — Encounter: Payer: Self-pay | Admitting: Internal Medicine

## 2017-01-01 ENCOUNTER — Other Ambulatory Visit (INDEPENDENT_AMBULATORY_CARE_PROVIDER_SITE_OTHER): Payer: Managed Care, Other (non HMO)

## 2017-01-01 VITALS — BP 118/82 | HR 50 | Temp 98.2°F | Ht 65.0 in | Wt 175.0 lb

## 2017-01-01 DIAGNOSIS — R1032 Left lower quadrant pain: Secondary | ICD-10-CM

## 2017-01-01 DIAGNOSIS — Z0000 Encounter for general adult medical examination without abnormal findings: Secondary | ICD-10-CM

## 2017-01-01 DIAGNOSIS — Z23 Encounter for immunization: Secondary | ICD-10-CM | POA: Diagnosis not present

## 2017-01-01 LAB — TSH: TSH: 4.22 u[IU]/mL (ref 0.35–4.50)

## 2017-01-01 LAB — BASIC METABOLIC PANEL
BUN: 14 mg/dL (ref 6–23)
CO2: 30 mEq/L (ref 19–32)
Calcium: 9.9 mg/dL (ref 8.4–10.5)
Chloride: 102 mEq/L (ref 96–112)
Creatinine, Ser: 1.21 mg/dL (ref 0.40–1.50)
GFR: 66.6 mL/min (ref >60.00)
Glucose, Bld: 98 mg/dL (ref 70–99)
Potassium: 4.1 mEq/L (ref 3.5–5.1)
Sodium: 140 mEq/L (ref 135–145)

## 2017-01-01 LAB — CBC WITH DIFFERENTIAL/PLATELET
Basophils Absolute: 0 10*3/uL (ref 0.0–0.1)
Basophils Relative: 1 % (ref 0.0–3.0)
Eosinophils Absolute: 0.3 10*3/uL (ref 0.0–0.7)
Eosinophils Relative: 5.7 % — ABNORMAL HIGH (ref 0.0–5.0)
HCT: 45.7 % (ref 39.0–52.0)
Hemoglobin: 15.7 g/dL (ref 13.0–17.0)
Lymphocytes Relative: 31.5 % (ref 12.0–46.0)
Lymphs Abs: 1.5 10*3/uL (ref 0.7–4.0)
MCHC: 34.4 g/dL (ref 30.0–36.0)
MCV: 88.5 fl (ref 78.0–100.0)
Monocytes Absolute: 0.5 10*3/uL (ref 0.1–1.0)
Monocytes Relative: 10.7 % (ref 3.0–12.0)
Neutro Abs: 2.5 10*3/uL (ref 1.4–7.7)
Neutrophils Relative %: 51.1 % (ref 43.0–77.0)
Platelets: 202 10*3/uL (ref 150.0–400.0)
RBC: 5.16 Mil/uL (ref 4.22–5.81)
RDW: 12.8 % (ref 11.5–15.5)
WBC: 4.9 10*3/uL (ref 4.0–10.5)

## 2017-01-01 LAB — URINALYSIS
Bilirubin Urine: NEGATIVE
Hgb urine dipstick: NEGATIVE
Ketones, ur: NEGATIVE
Leukocytes, UA: NEGATIVE
Nitrite: NEGATIVE
Specific Gravity, Urine: 1.025 (ref 1.000–1.030)
Total Protein, Urine: NEGATIVE
Urine Glucose: NEGATIVE
Urobilinogen, UA: 0.2 (ref 0.0–1.0)
pH: 5.5 (ref 5.0–8.0)

## 2017-01-01 LAB — PSA: PSA: 1.09 ng/mL (ref 0.10–4.00)

## 2017-01-01 LAB — LIPID PANEL
Cholesterol: 331 mg/dL — ABNORMAL HIGH (ref 0–200)
HDL: 41 mg/dL (ref >39.00)
NonHDL: 290.12
Total CHOL/HDL Ratio: 8
Triglycerides: 324 mg/dL — ABNORMAL HIGH (ref 0.0–149.0)
VLDL: 64.8 mg/dL — ABNORMAL HIGH (ref 0.0–40.0)

## 2017-01-01 LAB — HEPATIC FUNCTION PANEL
ALT: 43 U/L (ref 0–53)
AST: 26 U/L (ref 0–37)
Albumin: 4.6 g/dL (ref 3.5–5.2)
Alkaline Phosphatase: 59 U/L (ref 39–117)
Bilirubin, Direct: 0.2 mg/dL (ref 0.0–0.3)
Total Bilirubin: 3.1 mg/dL — ABNORMAL HIGH (ref 0.2–1.2)
Total Protein: 7.1 g/dL (ref 6.0–8.3)

## 2017-01-01 LAB — LDL CHOLESTEROL, DIRECT: Direct LDL: 210 mg/dL

## 2017-01-01 MED ORDER — VITAMIN D3 50 MCG (2000 UT) PO CAPS: 2000.0000 [IU] | ORAL_CAPSULE | Freq: Every day | ORAL | 3 refills | Status: DC

## 2017-01-01 MED ORDER — ROSUVASTATIN CALCIUM 5 MG PO TABS: 5.0000 mg | ORAL_TABLET | Freq: Every day | ORAL | 3 refills | Status: DC

## 2017-01-01 MED ORDER — ZOLPIDEM TARTRATE 10 MG PO TABS
10.0000 mg | ORAL_TABLET | Freq: Every evening | ORAL | 3 refills | Status: DC | PRN
Start: 2017-01-01 — End: 2019-02-23

## 2017-01-01 MED ORDER — B COMPLEX PO TABS: 1.0000 | ORAL_TABLET | Freq: Every day | ORAL | 3 refills | Status: DC

## 2017-01-01 NOTE — Assessment & Plan Note (Addendum)
We discussed age appropriate health related issues, including available/recomended screening tests and vaccinations. We discussed a need for adhering to healthy diet and exercise. Labs were ordered to be later reviewed . All questions were answered. Had abd CT 2017 Due colonoscopy. Pt would like to have a cologuard.

## 2017-01-01 NOTE — Assessment & Plan Note (Signed)
LLQ abd pain at times. CT abd 2017 B renal stones. Colon 2003 w/hemorrhoids. Due colonoscopy

## 2017-01-01 NOTE — Progress Notes (Signed)
Subjective:  Patient ID: Kyle Griffin, male    DOB: 1965/06/19  Age: 51 y.o. MRN: [id number]  CC: No chief complaint on file.   HPI Kyle LennertJason C Amend presents for a well exam C/o LLQ abd pain at times. CT abd 2017 B renal stones. Colon 2003 w/hemorrhoids.  Outpatient Medications Prior to Visit  Medication Sig Dispense Refill  . cetirizine (ZYRTEC) 10 MG tablet Take 10 mg by mouth daily as needed. allergies    . escitalopram (LEXAPRO) 10 MG tablet TAKE 1 TABLET DAILY 90 tablet 1  . famotidine (PEPCID) 20 MG tablet Take 1 tablet (20 mg total) by mouth daily. ulcers 30 tablet 2  . meclizine (ANTIVERT) 12.5 MG tablet Take 1 tablet (12.5 mg total) by mouth 3 (three) times daily as needed for dizziness. 60 tablet 1  . naproxen (NAPROSYN) 500 MG tablet Take 1 tablet (500 mg total) by mouth 2 (two) times daily with a meal. 30 tablet 0  . rosuvastatin (CRESTOR) 5 MG tablet Take 1 tablet (5 mg total) by mouth daily. 90 tablet 3  . traMADol (ULTRAM) 50 MG tablet Take 1 tablet (50 mg total) by mouth every 8 (eight) hours as needed. 60 tablet 0  . zolpidem (AMBIEN) 10 MG tablet Take 1 tablet (10 mg total) by mouth at bedtime as needed for sleep. 30 tablet 0   No facility-administered medications prior to visit.     ROS Review of Systems  Constitutional: Negative for appetite change, fatigue and unexpected weight change.  HENT: Negative for congestion, nosebleeds, sneezing, sore throat and trouble swallowing.   Eyes: Negative for itching and visual disturbance.  Respiratory: Negative for cough.   Cardiovascular: Negative for chest pain, palpitations and leg swelling.  Gastrointestinal: Positive for abdominal pain. Negative for abdominal distention, blood in stool, diarrhea and nausea.  Genitourinary: Negative for frequency and hematuria.  Musculoskeletal: Negative for back pain, gait problem, joint swelling and neck pain.  Skin: Negative for rash.  Neurological: Negative for dizziness,  tremors, speech difficulty and weakness.  Psychiatric/Behavioral: Negative for agitation, dysphoric mood and sleep disturbance. The patient is not nervous/anxious.     Objective:  BP 118/82 (BP Location: Left Arm, Patient Position: Sitting, Cuff Size: Large)   Pulse (!) 50   Temp 98.2 F (36.8 C) (Oral)   Ht 5\' 5"  (1.651 m)   Wt 175 lb (79.4 kg)   SpO2 99%   BMI 29.12 kg/m   BP Readings from Last 3 Encounters:  01/01/17 118/82  05/18/16 116/88  05/02/16 (!) 138/92    Wt Readings from Last 3 Encounters:  01/01/17 175 lb (79.4 kg)  05/18/16 168 lb (76.2 kg)  05/02/16 174 lb (78.9 kg)    Physical Exam  Constitutional: He is oriented to person, place, and time. He appears well-developed. No distress.  NAD  HENT:  Mouth/Throat: Oropharynx is clear and moist.  Eyes: Conjunctivae are normal. Pupils are equal, round, and reactive to light.  Neck: Normal range of motion. No JVD present. No thyromegaly present.  Cardiovascular: Normal rate, regular rhythm, normal heart sounds and intact distal pulses. Exam reveals no gallop and no friction rub.  No murmur heard. Pulmonary/Chest: Effort normal and breath sounds normal. No respiratory distress. He has no wheezes. He has no rales. He exhibits no tenderness.  Abdominal: Soft. Bowel sounds are normal. He exhibits no distension and no mass. There is no tenderness. There is no rebound and no guarding.  Musculoskeletal: Normal range of motion. He exhibits  no edema or tenderness.  Lymphadenopathy:    He has no cervical adenopathy.  Neurological: He is alert and oriented to person, place, and time. He has normal reflexes. No cranial nerve deficit. He exhibits normal muscle tone. He displays a negative Romberg sign. Coordination and gait normal.  Skin: Skin is warm and dry. No rash noted.  Psychiatric: He has a normal mood and affect. His behavior is normal. Judgment and thought content normal.    Lab Results  Component Value Date   WBC  7.4 01/25/2016   HGB 15.3 01/25/2016   HCT 43.5 01/25/2016   PLT 228 01/25/2016   GLUCOSE 102 (H) 01/25/2016   CHOL 243 (H) 05/24/2015   TRIG 220.0 (H) 05/24/2015   HDL 43.80 05/24/2015   LDLDIRECT 154.0 05/24/2015   ALT 37 05/24/2015   AST 23 05/24/2015   NA 138 01/25/2016   K 3.8 01/25/2016   CL 103 01/25/2016   CREATININE 1.38 (H) 01/25/2016   BUN 14 01/25/2016   CO2 25 01/25/2016   TSH 4.18 09/10/2012   PSA 0.56 05/24/2015   HGBA1C 5.5 10/12/2010    No results found.  Assessment & Plan:   There are no diagnoses linked to this encounter. I am having Augusta C. Clinton SawyerWilliamson maintain his cetirizine, rosuvastatin, meclizine, famotidine, traMADol, naproxen, zolpidem, and escitalopram.  No orders of the defined types were placed in this encounter.    Follow-up: No Follow-up on file.  Sonda PrimesAlex Willadean Guyton, MD

## 2017-01-05 MED ORDER — ROSUVASTATIN CALCIUM 5 MG PO TABS: 5.0000 mg | ORAL_TABLET | Freq: Every day | ORAL | 3 refills | Status: DC

## 2017-01-05 NOTE — Addendum Note (Signed)
Addended by: Scarlett PrestoFRIEDENBACH, Hiep Ollis on: 01/05/2017 04:55 PM   Modules accepted: Orders

## 2017-02-28 ENCOUNTER — Ambulatory Visit (INDEPENDENT_AMBULATORY_CARE_PROVIDER_SITE_OTHER): Payer: Managed Care, Other (non HMO) | Admitting: Family

## 2017-02-28 ENCOUNTER — Encounter: Payer: Self-pay | Admitting: Family

## 2017-02-28 VITALS — BP 118/80 | HR 57 | Temp 98.4°F | Ht 65.0 in | Wt 177.0 lb

## 2017-02-28 DIAGNOSIS — J0191 Acute recurrent sinusitis, unspecified: Secondary | ICD-10-CM | POA: Diagnosis not present

## 2017-02-28 MED ORDER — AMOXICILLIN-POT CLAVULANATE 875-125 MG PO TABS: 1.0000 | ORAL_TABLET | Freq: Two times a day (BID) | ORAL | 0 refills | Status: DC

## 2017-02-28 NOTE — Progress Notes (Signed)
Kyle LennertJason C Griffin is a 52 y.o. male with the following history as recorded in [employer]:  Patient Active Problem List   Diagnosis Date Noted  . Cough 05/18/2016  . Right otitis media 12/11/2015  . 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 Tongue coating 10/26/2015  . Vertigo 05/31/2015  . Recurrent sinusitis 04/25/2013  . Abdominal pain, LLQ 01/20/2013  . Well adult exam 09/16/2012  . Insomnia 11/14/2011  . 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 Nephrolithiasis 05/12/2011  . Diarrhea 02/06/2011  . 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 Hepatic steatosis 05/26/2010  . Hyperglycemia 05/26/2010  . Abdominal pain, epigastric 04/11/2010  . LIVER FUNCTION TESTS, ABNORMAL, HX OF 04/11/2010  . Slowing of urinary stream 01/08/2009  . TOBACCO USE, QUIT 01/08/2009  . CERVICAL RADICULOPATHY 05/08/2008  . Chest pain 05/08/2008  . Acute sinus infection 03/12/2008  . ASTHMA 03/12/2008  . ASTHMATIC BRONCHITIS, ACUTE 07/25/2007  . ALLERGIC RHINITIS 07/25/2007  . GERD 07/25/2007  . IBS 07/25/2007  . HYPERLIPIDEMIA 05/03/2007  . HEMORRHOIDS, NOS 05/03/2007  . ABNORMAL RESULT, FUNCTION STUDY, LIVER 10/01/2006    Current Outpatient Medications  Medication Sig Dispense Refill  . b complex vitamins tablet Take 1 tablet daily by mouth. 100 tablet 3  . cetirizine (ZYRTEC) 10 MG tablet Take 10 mg by mouth daily as needed. allergies    . Cholecalciferol (VITAMIN D3) 2000 units capsule Take 1 capsule (2,000 Units total) daily by mouth. 100 capsule 3  . escitalopram (LEXAPRO) 10 MG tablet TAKE 1 TABLET DAILY 90 tablet 1  . famotidine (PEPCID) 20 MG tablet Take 1 tablet (20 mg total) by mouth daily. ulcers 30 tablet 2  . naproxen (NAPROSYN) 500 MG tablet Take 1 tablet (500 mg total) by mouth 2 (two) times daily with a meal. 30 tablet 0  . rosuvastatin (CRESTOR) 5 MG tablet Take 1 tablet (5 mg total) daily by mouth. 90 tablet 3  . zolpidem (AMBIEN) 10 MG tablet Take 1 tablet (10 mg total) at  bedtime as needed by mouth for sleep. 30 tablet 3  . amoxicillin-clavulanate (AUGMENTIN) 875-125 MG tablet Take 1 tablet by mouth 2 (two) times daily. 20 tablet 0   No current facility-administered medications for this visit.     Allergies: Oxycodone-acetaminophen  Past Medical History:  Diagnosis Date  . Allergic rhinitis   . Asthma, exercise induced   . GERD (gastroesophageal reflux disease)   . Gilbert disease   . Hyperlipidemia   . IBS (irritable bowel syndrome)   . Nephrolithiasis   . Renal stones     Past Surgical History:  Procedure Laterality Date  . FOOT SURGERY     Cyst, Heel  . ROTATOR CUFF REPAIR      Family History  Problem Relation Age of Onset  . Coronary artery disease Mother   . Stroke Mother   . Heart disease Mother   . Hyperlipidemia Mother   . Cancer Mother [id number]       lung ca  . Heart disease Father   . Hyperlipidemia Father   . Diabetes Neg Hx     Social History   Tobacco Use  . Smoking status: Former Smoker    Last attempt to quit: 12/23/1999    Years since quitting: 17.1  . Smokeless tobacco: Former Engineer, water[employer]  Substance Use Topics  . Alcohol use: No    Subjective:   Patient presents with concerns for sinus infection; symptoms x 4-5 days; prone to recurrent sinus infections; underlying allergies; denies any fever  or cough or chest congestion; + sore throat- drainage; not currently using his Flonase;    Objective:  Vitals:   02/28/17 1304  BP: 118/80  Pulse: (!) 57  Temp: 98.4 F (36.9 C)  TempSrc: Oral  SpO2: 99%  Weight: 177 lb (80.3 kg)  Height: 5\' 5"  (1.651 m)    General: Well developed, well nourished, in no acute distress  Skin : Warm and dry.  Head: Normocephalic and atraumatic  Eyes: Sclera and conjunctiva clear; pupils round and reactive to light; extraocular movements intact  Ears: External normal; canals clear; tympanic membranes normal  Oropharynx: Pink, supple. No suspicious lesions  Neck: Supple without thyromegaly,  adenopathy  Lungs: Respirations unlabored; clear to auscultation bilaterally without wheeze, rales, rhonchi  CVS exam: normal rate and regular rhythm.  Neurologic: Alert and oriented; speech intact; face symmetrical; moves all extremities well; CNII-XII intact without focal deficit  Assessment:  1. Acute recurrent sinusitis, unspecified location     Plan:  Rx for Augmentin 875 mg bid x 10 days; encouraged to use his Zyrtec and Flonase; increase fluids, rest and follow-up worse, no better.    No Follow-up on file.  No orders of the defined types were placed in this encounter.   Requested Prescriptions   Signed Prescriptions Disp Refills  . amoxicillin-clavulanate (AUGMENTIN) 875-125 MG tablet 20 tablet 0    Sig: Take 1 tablet by mouth 2 (two) times daily.

## 2017-05-03 ENCOUNTER — Emergency Department (HOSPITAL_COMMUNITY): Payer: Managed Care, Other (non HMO)

## 2017-05-03 ENCOUNTER — Encounter (HOSPITAL_COMMUNITY): Payer: Self-pay | Admitting: Emergency Medicine

## 2017-05-03 ENCOUNTER — Emergency Department (HOSPITAL_COMMUNITY)
Admission: EM | Admit: 2017-05-03 | Discharge: 2017-05-03 | Disposition: A | Discharge status: Home / Self Care | Payer: Managed Care, Other (non HMO) | Attending: Emergency Medicine | Admitting: Emergency Medicine

## 2017-05-03 ENCOUNTER — Other Ambulatory Visit: Payer: Self-pay

## 2017-05-03 DIAGNOSIS — Z87891 Personal history of nicotine dependence: Secondary | ICD-10-CM | POA: Insufficient documentation

## 2017-05-03 DIAGNOSIS — J45909 Unspecified asthma, uncomplicated: Secondary | ICD-10-CM | POA: Insufficient documentation

## 2017-05-03 DIAGNOSIS — N23 Unspecified renal colic: Secondary | ICD-10-CM | POA: Diagnosis not present

## 2017-05-03 DIAGNOSIS — Z7982 Long term (current) use of aspirin: Secondary | ICD-10-CM | POA: Insufficient documentation

## 2017-05-03 DIAGNOSIS — Z79899 Other long term (current) drug therapy: Secondary | ICD-10-CM | POA: Diagnosis not present

## 2017-05-03 DIAGNOSIS — R109 Unspecified abdominal pain: Secondary | ICD-10-CM | POA: Diagnosis present

## 2017-05-03 LAB — URINALYSIS, ROUTINE W REFLEX MICROSCOPIC
Bacteria, UA: NONE SEEN
Bilirubin Urine: NEGATIVE
Glucose, UA: NEGATIVE mg/dL
Ketones, ur: NEGATIVE mg/dL
Leukocytes, UA: NEGATIVE
Nitrite: NEGATIVE
Protein, ur: NEGATIVE mg/dL
Specific Gravity, Urine: 1.018 (ref 1.005–1.030)
Squamous Epithelial / LPF: NONE SEEN
pH: 6 (ref 5.0–8.0)

## 2017-05-03 LAB — BASIC METABOLIC PANEL
Anion gap: 13 (ref 5–15)
BUN: 14 mg/dL (ref 6–20)
CO2: 22 mmol/L (ref 22–32)
Calcium: 9.2 mg/dL (ref 8.9–10.3)
Chloride: 103 mmol/L (ref 101–111)
Creatinine, Ser: 1.61 mg/dL — ABNORMAL HIGH (ref 0.61–1.24)
GFR calc Af Amer: 55 mL/min — ABNORMAL LOW (ref >60)
GFR calc non Af Amer: 47 mL/min — ABNORMAL LOW (ref >60)
Glucose, Bld: 185 mg/dL — ABNORMAL HIGH (ref 65–99)
Potassium: 3.6 mmol/L (ref 3.5–5.1)
Sodium: 138 mmol/L (ref 135–145)

## 2017-05-03 LAB — CBC
HCT: 41.8 % (ref 39.0–52.0)
Hemoglobin: 14.6 g/dL (ref 13.0–17.0)
MCH: 30.5 pg (ref 26.0–34.0)
MCHC: 34.9 g/dL (ref 30.0–36.0)
MCV: 87.4 fL (ref 78.0–100.0)
Platelets: 166 10*3/uL (ref 150–400)
RBC: 4.78 MIL/uL (ref 4.22–5.81)
RDW: 12.5 % (ref 11.5–15.5)
WBC: 8.1 10*3/uL (ref 4.0–10.5)

## 2017-05-03 MED ORDER — KETOROLAC TROMETHAMINE 30 MG/ML IJ SOLN
30.0000 mg | Freq: Once | INTRAMUSCULAR | Status: AC
  Administered 2017-05-03: 30 mg via INTRAVENOUS
  Filled 2017-05-03: qty 1

## 2017-05-03 MED ORDER — HYDROMORPHONE HCL 2 MG PO TABS: 2.0000 mg | ORAL_TABLET | ORAL | 0 refills | Status: DC | PRN

## 2017-05-03 MED ORDER — TAMSULOSIN HCL 0.4 MG PO CAPS: 0.4000 mg | ORAL_CAPSULE | Freq: Every day | ORAL | 0 refills | Status: DC

## 2017-05-03 MED ORDER — HYDROMORPHONE HCL 1 MG/ML IJ SOLN
1.0000 mg | Freq: Once | INTRAMUSCULAR | Status: AC
  Administered 2017-05-03: 1 mg via INTRAVENOUS
  Filled 2017-05-03: qty 1

## 2017-05-03 MED ORDER — ONDANSETRON HCL 4 MG/2ML IJ SOLN
4.0000 mg | Freq: Once | INTRAMUSCULAR | Status: AC
  Administered 2017-05-03: 4 mg via INTRAVENOUS
  Filled 2017-05-03: qty 2

## 2017-05-03 NOTE — ED Provider Notes (Signed)
[facility] EMERGENCY DEPARTMENT Provider Note   CSN: [id number] Arrival date & time: 05/03/17  0103     History   Chief Complaint Chief Complaint  Patient presents with  . Flank Pain    HPI Kyle Griffin is a 52 y.o. male.  Patient presents to the emergency department for evaluation of right flank pain.  Patient reports sudden onset of severe, unrelenting sharp and stabbing pain in the right flank at 11 PM.  Patient has a history of kidney stones.  He reports that his pain is similar.  He did take oral Dilaudid at home but the pain has not improved.      Past Medical History:  Diagnosis Date  . Allergic rhinitis   . Asthma, exercise induced   . GERD (gastroesophageal reflux disease)   . Gilbert disease   . Hyperlipidemia   . IBS (irritable bowel syndrome)   . Nephrolithiasis   . Renal stones     Patient Active Problem List   Diagnosis Date Noted  . Cough 05/18/2016  . Right otitis media 12/11/2015  . 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 Tongue coating 10/26/2015  . Vertigo 05/31/2015  . Recurrent sinusitis 04/25/2013  . Abdominal pain, LLQ 01/20/2013  . Well adult exam 09/16/2012  . Insomnia 11/14/2011  . 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 Nephrolithiasis 05/12/2011  . Diarrhea 02/06/2011  . 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 Hepatic steatosis 05/26/2010  . Hyperglycemia 05/26/2010  . Abdominal pain, epigastric 04/11/2010  . LIVER FUNCTION TESTS, ABNORMAL, HX OF 04/11/2010  . Slowing of urinary stream 01/08/2009  . TOBACCO USE, QUIT 01/08/2009  . CERVICAL RADICULOPATHY 05/08/2008  . Chest pain 05/08/2008  . Acute sinus infection 03/12/2008  . ASTHMA 03/12/2008  . ASTHMATIC BRONCHITIS, ACUTE 07/25/2007  . ALLERGIC RHINITIS 07/25/2007  . GERD 07/25/2007  . IBS 07/25/2007  . HYPERLIPIDEMIA 05/03/2007  . HEMORRHOIDS, NOS 05/03/2007  . ABNORMAL RESULT, FUNCTION STUDY, LIVER 10/01/2006    Past Surgical  History:  Procedure Laterality Date  . FOOT SURGERY     Cyst, Heel  . ROTATOR CUFF REPAIR         Home Medications    Prior to Admission medications   Medication Sig Start Date End Date Taking? Authorizing Provider  aspirin EC 81 MG tablet Take 81 mg by mouth daily.   Yes [provider]  cetirizine (ZYRTEC) 10 MG tablet Take 10 mg by mouth daily as needed. allergies   Yes [provider]  escitalopram (LEXAPRO) 10 MG tablet TAKE 1 TABLET DAILY 12/25/16  Yes Plotnikov, Georgina Quint, MD  famotidine (PEPCID) 20 MG tablet Take 1 tablet (20 mg total) by mouth daily. ulcers Patient taking differently: Take 20 mg by mouth daily as needed for heartburn. ulcers  11/17/15  Yes Plotnikov, Georgina Quint, MD  rosuvastatin (CRESTOR) 5 MG tablet Take 1 tablet (5 mg total) daily by mouth. 01/05/17  Yes Plotnikov, Georgina Quint, MD  zolpidem (AMBIEN) 10 MG tablet Take 1 tablet (10 mg total) at bedtime as needed by mouth for sleep. 01/01/17  Yes Plotnikov, Georgina Quint, MD  HYDROmorphone (DILAUDID) 2 MG tablet Take 1 tablet (2 mg total) by mouth every 4 (four) hours as needed for severe pain. 05/03/17   Gilda Crease, MD  tamsulosin (FLOMAX) 0.4 MG CAPS capsule Take 1 capsule (0.4 mg total) by mouth daily. 05/03/17   Gilda Crease, MD    Family History Family History  Problem Relation Age of  Onset  . Coronary artery disease Mother   . Stroke Mother   . Heart disease Mother   . Hyperlipidemia Mother   . Cancer Mother 182       lung ca  . Heart disease Father   . Hyperlipidemia Father   . Diabetes Neg Hx     Social History Social History   Tobacco Use  . Smoking status: Former Smoker    Last attempt to quit: 12/23/1999    Years since quitting: 17.3  . Smokeless tobacco: Former Engineer, waterUser  Substance Use Topics  . Alcohol use: No  . Drug use: No     Allergies   Oxycodone-acetaminophen   Review of Systems Review of Systems  Genitourinary: Positive for flank pain.  All  other systems reviewed and are negative.    Physical Exam Updated Vital Signs BP 120/79   Pulse 62   Temp 98.5 F (36.9 C)   Resp 17   Ht 5\' 5"  (1.651 m)   Wt 74.8 kg (165 lb)   SpO2 93%   BMI 27.46 kg/m   Physical Exam  Constitutional: He is oriented to person, place, and time. He appears well-developed and well-nourished. He appears distressed.  HENT:  Head: Normocephalic and atraumatic.  Right Ear: Hearing normal.  Left Ear: Hearing normal.  Nose: Nose normal.  Mouth/Throat: Oropharynx is clear and moist and mucous membranes are normal.  Eyes: Conjunctivae and EOM are normal. Pupils are equal, round, and reactive to light.  Neck: Normal range of motion. Neck supple.  Cardiovascular: Regular rhythm, S1 normal and S2 normal. Exam reveals no gallop and no friction rub.  No murmur heard. Pulmonary/Chest: Effort normal and breath sounds normal. No respiratory distress. He exhibits no tenderness.  Abdominal: Soft. Normal appearance and bowel sounds are normal. There is no hepatosplenomegaly. There is no tenderness. There is no rebound, no guarding, no tenderness at McBurney's point and negative Murphy's sign. No hernia.  Musculoskeletal: Normal range of motion.  Neurological: He is alert and oriented to person, place, and time. He has normal strength. No cranial nerve deficit or sensory deficit. Coordination normal. GCS eye subscore is 4. GCS verbal subscore is 5. GCS motor subscore is 6.  Skin: Skin is warm, dry and intact. No rash noted. No cyanosis.  Psychiatric: He has a normal mood and affect. His speech is normal and behavior is normal. Thought content normal.  Nursing note and vitals reviewed.    ED Treatments / Results  Labs (all labs ordered are listed, but only abnormal results are displayed) Labs Reviewed  URINALYSIS, ROUTINE W REFLEX MICROSCOPIC - Abnormal; Notable for the following components:      Result Value   Hgb urine dipstick LARGE (*)    All other  components within normal limits  BASIC METABOLIC PANEL - Abnormal; Notable for the following components:   Glucose, Bld 185 (*)    Creatinine, Ser 1.61 (*)    GFR calc non Af Amer 47 (*)    GFR calc Af Amer 55 (*)    All other components within normal limits  CBC    EKG  EKG Interpretation None       Radiology Ct Renal Stone Study  Result Date: 05/03/2017 CLINICAL DATA:  Acute onset of right lower back pain, radiating to the groin. EXAM: CT ABDOMEN AND PELVIS WITHOUT CONTRAST TECHNIQUE: Multidetector CT imaging of the abdomen and pelvis was performed following the standard protocol without IV contrast. COMPARISON:  CT of the abdomen and  pelvis from 01/26/2016 FINDINGS: Lower chest: Minimal bibasilar atelectasis is noted. The visualized portions of the mediastinum are unremarkable. Hepatobiliary: There is diffuse fatty infiltration within the liver, with areas of sparing. The gallbladder is unremarkable in appearance. The common bile duct remains normal in caliber. Pancreas: The pancreas is within normal limits. Spleen: The spleen is unremarkable in appearance. Adrenals/Urinary Tract: The adrenal glands are unremarkable in appearance. Minimal right-sided hydronephrosis is noted, with prominence of the right ureter along its entire course. An obstructing 4 mm stone is noted distally at the right vesicoureteral junction. Right-sided perinephric stranding and fluid are noted. Nonobstructing bilateral renal stones are seen, measuring up to 7 mm in size. Stomach/Bowel: The stomach is unremarkable in appearance. The small bowel is within normal limits. The appendix is normal in caliber, without evidence of appendicitis. The colon is unremarkable in appearance. Vascular/Lymphatic: The abdominal aorta is unremarkable in appearance. The inferior vena cava is grossly unremarkable. No retroperitoneal lymphadenopathy is seen. No pelvic sidewall lymphadenopathy is identified. Reproductive: The bladder is  mildly distended and otherwise unremarkable. The prostate is normal in size, with minimal surrounding calcification. Other: Superior retraction of the right testis may be transient in nature. Musculoskeletal: No acute osseous abnormalities are identified. The visualized musculature is unremarkable in appearance. IMPRESSION: 1. Minimal right-sided hydronephrosis, with an obstructing 4 mm stone noted distally at the right vesicoureteral junction. 2. Nonobstructing bilateral renal stones measure up to 7 mm in size. 3. Diffuse fatty infiltration within the liver. 4. Superior retraction of the right testis may be transient in nature. Electronically Signed   By: Roanna Raider M.D.   On: 05/03/2017 06:06    Procedures Procedures (including critical care time)  Medications Ordered in ED Medications  HYDROmorphone (DILAUDID) injection 1 mg (1 mg Intravenous Given 05/03/17 0227)  ondansetron (ZOFRAN) injection 4 mg (4 mg Intravenous Given 05/03/17 0227)  HYDROmorphone (DILAUDID) injection 1 mg (1 mg Intravenous Given 05/03/17 0257)  ketorolac (TORADOL) 30 MG/ML injection 30 mg (30 mg Intravenous Given 05/03/17 0654)  HYDROmorphone (DILAUDID) injection 1 mg (1 mg Intravenous Given 05/03/17 0655)     Initial Impression / Assessment and Plan / ED Course  I have reviewed the triage vital signs and the nursing notes.  Pertinent labs & imaging results that were available during my care of the patient were reviewed by me and considered in my medical decision making (see chart for details).     Patient with history of kidney stones presents with severe right flank pain that onset suddenly tonight.  Patient had taken Dilaudid p.o. that he had leftover from a kidney stone 6 years ago but did not get any relief.  Here in the ER he had multiple doses of IV analgesia with partial relief of his pain.  He therefore underwent CT scan to further evaluate.  This did confirm distal ureteral stone, 4 mm.  He was administered  Toradol, additional Dilaudid and discharged with oral analgesia, Flomax.  Will follow up with [facility].  Final Clinical Impressions(s) / ED Diagnoses   Final diagnoses:  Ureteral colic    ED Discharge Orders        Ordered    tamsulosin (FLOMAX) 0.4 MG CAPS capsule  Daily     05/03/17 0638    HYDROmorphone (DILAUDID) 2 MG tablet  Every 4 hours PRN     05/03/17 1610       Gilda Crease, MD 05/03/17 [phone]

## 2017-05-03 NOTE — ED Notes (Signed)
Pt reports history of kidney stones. Woke up this evening at 2300 hrs in excruciating pain in his right lower back radiating around to his groin. Pt states his kidney stones always present this way.

## 2017-05-03 NOTE — ED Triage Notes (Signed)
Pt reports sudden onset of R flank pain with radiation into RLQ onset 11P. Pt reports hx of kidney stones. Pt highly uncomfortable in triage. Pt reports taking PO Dilaudid 2mg  at home 0000 but pain has not helped

## 2017-05-03 NOTE — ED Notes (Signed)
Patient transported to CT 

## 2017-07-31 ENCOUNTER — Telehealth: Payer: Self-pay | Admitting: Internal Medicine

## 2017-07-31 NOTE — Telephone Encounter (Signed)
Copied from CRM [phone]. Topic: Quick Communication - See Telephone Encounter >> Jul 31, 2017  5:16 PM Louie BunPalacios Medina, Rosey Batheresa D wrote: CRM for notification. See Telephone encounter for: 07/31/17. Patient called and wanted to let his provider know that his mail order gave him a new mail order fax number to where his refill need to be sent and its [phone].

## 2017-08-01 ENCOUNTER — Other Ambulatory Visit: Payer: Self-pay | Admitting: Urology

## 2017-08-01 NOTE — Telephone Encounter (Signed)
Patients wife, Lanora Manislizabeth, calling and states that new prescriptions and refills need to be sent to the new pharmacy as well. CB#: [phone] [facility] [place] - [id number]

## 2017-08-02 ENCOUNTER — Encounter (HOSPITAL_COMMUNITY): Admission: RE | Payer: Self-pay | Source: Ambulatory Visit

## 2017-08-02 ENCOUNTER — Ambulatory Visit (HOSPITAL_COMMUNITY): Admission: RE | Admit: 2017-08-02 | Payer: Managed Care, Other (non HMO) | Source: Ambulatory Visit | Admitting: Urology

## 2017-08-02 SURGERY — LITHOTRIPSY, ESWL
Anesthesia: LOCAL | Laterality: Right

## 2017-08-02 NOTE — Telephone Encounter (Signed)
Noted, new pharmacy is in chart

## 2017-08-06 ENCOUNTER — Other Ambulatory Visit: Payer: Self-pay | Admitting: Urology

## 2017-08-07 ENCOUNTER — Telehealth: Payer: Self-pay

## 2017-08-07 ENCOUNTER — Encounter (HOSPITAL_COMMUNITY): Payer: Self-pay | Admitting: *Deleted

## 2017-08-07 MED ORDER — ESCITALOPRAM OXALATE 10 MG PO TABS: 10.0000 mg | ORAL_TABLET | Freq: Every day | ORAL | 0 refills | Status: DC

## 2017-08-07 MED ORDER — ROSUVASTATIN CALCIUM 5 MG PO TABS: 5.0000 mg | ORAL_TABLET | Freq: Every day | ORAL | 0 refills | Status: DC

## 2017-08-07 NOTE — Telephone Encounter (Signed)
90 RX sent, pt needs OV before refills given  Copied from CRM [id number]. Topic: Inquiry >> Aug 06, 2017  3:17 PM Yvonna Alanisobinson, Andra M wrote: Reason for CRM: Patient's wife called stating that have not received patient's prescriptions yet. [facility] [place] - [phone] (Phone) [phone] (Fax). Patient needs the following two medications: Escitalopram (LEXAPRO) 10 MG tablet and Rosuvastatin (CRESTOR) 5 MG tablet.       Thank You!!!

## 2017-08-08 NOTE — H&P (Addendum)
Office Visit Report     08/01/2017    Ramon Dredge. Kienast         MRN: [id number]  PRIMARY CARE:  Georgina Quint. Plotnikov, MD  DOB: 11-24-1965, 52 year old Male  REFERRING:  Aleksei V. Plotnikov, MD  SSN: -[id number]  PROVIDER:  Bjorn Pippin, M.D.    TREATING:  Jerilee Field, M.D.    LOCATION:  [facility] [phone]    CC: I have kidney stones.  HPI: Goerge Mohr is a 52 year-old male established patient who is here for renal calculi.  The problem is on both sides. He first stated noticing pain on 05/03/2017. This is not his first kidney stone. He has had more than 5 stones prior to getting this one. He is currently having flank pain, back pain, and groin pain. He denies having nausea, vomiting, fever, and chills. He has caught a stone in his urine strainer since his symptoms began.   He has never had surgical treatment for calculi in the past.   Barbara Cower saw Dr. Alvester Morin and then Dr. Wilson Singer for kidney stones. He passed 5 small stones prior to his visit with Dr. Alvester Morin. He has had 5-6 stone episodes in the past but has never required surgery. He has slight back pain on the right when he saw Dr. Annabell Howells in March. He had some frequency and some urgency.   GU Hx: The patient has had kidney stone for about the past 10 years. They have always passed on their own. He has passed about 6 in the past. On 05/03/2017, he went to the emergency department and was found to have a distal 4 mm right ureterovesicular junction calculus. He has passed a stone since then.   He returns today with intermittent right flank pain over past 2-3 weeks. It comes and goes q 3 days. Dilaudid helped. No stone passage. Urine looks clear, but dark. He's drinking water.   ALLERGIES: Percocet TABS   MEDICATIONS: Adult Aspirin Low Strength 81 MG TBDP Oral  Crestor 5 MG Oral Tablet Oral  Lexapro 10 mg tablet    GU PSH: None     PSH Notes: Oral Surgery Tooth Extraction, Elbow Surgery, Shoulder Surgery, Ankle Surgery    NON-GU PSH: Dental Surgery Procedure - 2011   GU PMH: Urinary Urgency, He has mild urgency but no large distal stones and a clear urine. - 05/10/2017 Renal and ureteral calculus - 05/07/2017 BPH w/LUTS, Benign localized prostatic hyperplasia with lower urinary tract symptoms (LUTS) - 2014 Renal calculus, Nephrolithiasis - 2014     PMH Notes:  1898-02-20 00:00:00 - Note: Normal Routine History And Physical Adult   NON-GU PMH: Personal history of other endocrine, nutritional and metabolic disease, History of hypercholesterolemia - 2014 Personal history of other specified conditions, History of heartburn - 2014 GERD Hypercholesterolemia   FAMILY HISTORY: 1 Daughter - Other 1 son - Other Family Health Status - Father alive at age 24 - Runs In Family Family Health Status - Mother's Age - Runs In Family Family Health Status Number - Runs In Family Heart Disease - Mother, Father Hypertension - Mother, Father Lung Cancer - Mother Myocardial Infarction - Mother, Father nephrolithiasis - Mother Pure Hypercholesterolemia - Mother, Father Stroke Syndrome - Mother   SOCIAL HISTORY: Marital Status: Married Preferred Language: English; Race: White Current Smoking Status: Patient does not smoke anymore. Has not smoked since 04/21/1998.   Tobacco Use Assessment Completed:  Used Tobacco in last 30 days?  Drinks 4+ caffeinated  drinks per day.    Notes: Tobacco use, Alcohol Use, Caffeine Use, Occupation:, Marital History - Currently Married   REVIEW OF SYSTEMS:    GU Review Male:   Patient reports hard to postpone urination and have to strain to urinate . Patient denies frequent urination, burning/ pain with urination, get up at night to urinate, leakage of urine, stream starts and stops, trouble starting your stream, erection problems, and penile pain.  Gastrointestinal (Upper):   Patient denies nausea, vomiting, and indigestion/ heartburn.  Gastrointestinal (Lower):   Patient denies diarrhea  and constipation.  Constitutional:   Patient denies fever, night sweats, weight loss, and fatigue.  Skin:   Patient denies skin rash/ lesion and itching.  Eyes:   Patient denies blurred vision and double vision.  Ears/ Nose/ Throat:   Patient denies sore throat and sinus problems.  Hematologic/Lymphatic:   Patient denies easy bruising and swollen glands.  Cardiovascular:   Patient denies leg swelling and chest pains.  Respiratory:   Patient denies cough and shortness of breath.  Endocrine:   Patient denies excessive thirst.  Musculoskeletal:   Patient reports back pain. Patient denies joint pain.  Neurological:   Patient denies headaches and dizziness.  Psychologic:   Patient denies depression and anxiety.   VITAL SIGNS:      08/01/2017 03:39 PM  Weight 165 lb / 74.84 kg  Height 65 in / 165.1 cm  BP 133/88 mmHg  Pulse 54 /min  Temperature 98.0 F / 36.6 C  BMI 27.5 kg/m   MULTI-SYSTEM PHYSICAL EXAMINATION:    Constitutional: Well-nourished. No physical deformities. Normally developed. Good grooming.  Neck: Neck symmetrical, not swollen. Normal tracheal position.  Respiratory: No labored breathing, no use of accessory muscles. Normal breath sounds.  Cardiovascular: Regular rate and rhythm. Normal temperature, normal extremity pulses, no swelling, no varicosities.   Skin: No paleness, no jaundice, no cyanosis. No lesion, no ulcer, no rash.  Neurologic / Psychiatric: Oriented to time, oriented to place, oriented to person. No depression, no anxiety, no agitation.  Gastrointestinal: No mass, no tenderness, no rigidity, non obese abdomen.    PAST DATA REVIEWED:  Source Of History:  Patient  Records Review:   Previous Doctor Records  X-Ray Review: KUB: Reviewed Films. 04/2017 C.T. Abdomen/Pelvis: Reviewed Films. 04/2017   PROCEDURES:         KUB - 74018  A single view of the abdomen is obtained.      The bones appeared normal. The bowel gas pattern appeared normal. The soft tissues  were unremarkable.        Urinalysis w/Scope - 81001 Dipstick Dipstick Cont'd Micro  Color: Yellow Bilirubin: Neg WBC/hpf: 0 - 5/hpf  Appearance: Clear Ketones: Neg RBC/hpf: NS (Not Seen)  Specific Gravity: 1.025 Blood: 1+ Bacteria: NS (Not Seen)  pH: <=5.0 Protein: Neg Cystals: NS (Not Seen)  Glucose: Neg Urobilinogen: 0.2 Casts: NS (Not Seen)    Nitrites: Neg Trichomonas: Not Present    Leukocyte Esterase: Neg Mucous: Not Present      Epithelial Cells: NS (Not Seen)      Yeast: NS (Not Seen)      Sperm: Not Present   Notes:    ASSESSMENT:      ICD-10 Details  1 GU:   Renal calculus - N20.0   2   Ureteral calculus - N20.1    PLAN:           Medications New Meds: Tamsulosin Hcl 0.4 mg capsule 1 capsule PO Daily   #  30  1 Refill(s)          Orders X-Rays: KUB         Schedule Return Visit/Planned Activity: Next Available Appointment - Schedule Surgery         Document Letter(s):  Created for Patient: Clinical Summary        Notes:   right ureteral stone - discussed with patient the nature r/b of continued stone passage, ureteroscopy (and consider bilateral URS to clear all), and ESWL. He elects to proceed with right ESWL. He takes a baby ASA> I am away tomorrow and Dr. Alvester Morin can perform.   cc: Dr. Posey Rea         Next Appointment:      Next Appointment: 08/09/2017 12:30 PM    Appointment Type: Surgery     Location: [facility] [phone]    Provider: Jerilee Field, M.D.    Reason for Visit: OP WL RT ESWL     * Signed by Jerilee Field, M.D. on 08/07/17 at 3:57 PM (EDT)*     The information contained in this medical record document is considered private and confidential patient information. This information can only be used for the medical diagnosis and/or medical services that are being provided by the patient's selected caregivers. This information can only be distributed outside of the patient's care if the patient agrees and signs waivers  of authorization for this information to be sent to an outside source or route.  Addendum: pt delayed ESWL and was adding on the schedule for me. The prior 6-7 mm stone in the right MP appears to be passing and over the right mid-ureter on office KUB over the sacrum.

## 2017-08-09 ENCOUNTER — Encounter (HOSPITAL_COMMUNITY): Payer: Self-pay

## 2017-08-09 ENCOUNTER — Ambulatory Visit (HOSPITAL_COMMUNITY): Payer: 59

## 2017-08-09 ENCOUNTER — Encounter (HOSPITAL_COMMUNITY)
Admission: RE | Disposition: A | Discharge status: Home / Self Care | Payer: Self-pay | Source: Ambulatory Visit | Attending: Urology

## 2017-08-09 ENCOUNTER — Ambulatory Visit (HOSPITAL_COMMUNITY)
Admission: RE | Admit: 2017-08-09 | Discharge: 2017-08-09 | Disposition: A | Discharge status: Home / Self Care | Payer: 59 | Source: Ambulatory Visit | Attending: Urology | Admitting: Urology

## 2017-08-09 DIAGNOSIS — Z885 Allergy status to narcotic agent status: Secondary | ICD-10-CM | POA: Insufficient documentation

## 2017-08-09 DIAGNOSIS — N201 Calculus of ureter: Secondary | ICD-10-CM

## 2017-08-09 DIAGNOSIS — K219 Gastro-esophageal reflux disease without esophagitis: Secondary | ICD-10-CM | POA: Insufficient documentation

## 2017-08-09 DIAGNOSIS — Z87891 Personal history of nicotine dependence: Secondary | ICD-10-CM | POA: Insufficient documentation

## 2017-08-09 DIAGNOSIS — N202 Calculus of kidney with calculus of ureter: Secondary | ICD-10-CM | POA: Diagnosis not present

## 2017-08-09 DIAGNOSIS — E7800 Pure hypercholesterolemia, unspecified: Secondary | ICD-10-CM | POA: Diagnosis not present

## 2017-08-09 DIAGNOSIS — Z79899 Other long term (current) drug therapy: Secondary | ICD-10-CM | POA: Insufficient documentation

## 2017-08-09 HISTORY — PX: EXTRACORPOREAL SHOCK WAVE LITHOTRIPSY: SHX1557

## 2017-08-09 HISTORY — DX: Personal history of urinary calculi: Z87.442

## 2017-08-09 SURGERY — LITHOTRIPSY, ESWL
Anesthesia: LOCAL | Laterality: Right

## 2017-08-09 MED ORDER — CIPROFLOXACIN HCL 500 MG PO TABS
500.0000 mg | ORAL_TABLET | ORAL | Status: AC
  Administered 2017-08-09: 500 mg via ORAL
  Filled 2017-08-09: qty 1

## 2017-08-09 MED ORDER — DIAZEPAM 5 MG PO TABS
10.0000 mg | ORAL_TABLET | ORAL | Status: AC
  Administered 2017-08-09: 10 mg via ORAL
  Filled 2017-08-09: qty 2

## 2017-08-09 MED ORDER — DIPHENHYDRAMINE HCL 25 MG PO CAPS
25.0000 mg | ORAL_CAPSULE | ORAL | Status: AC
  Administered 2017-08-09: 25 mg via ORAL
  Filled 2017-08-09: qty 1

## 2017-08-09 MED ORDER — HYDROMORPHONE HCL 2 MG PO TABS: 2.0000 mg | ORAL_TABLET | Freq: Two times a day (BID) | ORAL | 0 refills | Status: DC | PRN

## 2017-08-09 MED ORDER — SODIUM CHLORIDE 0.9 % IV SOLN
INTRAVENOUS | Status: DC
  Administered 2017-08-09: 11:00:00 via INTRAVENOUS

## 2017-08-09 NOTE — Interval H&P Note (Signed)
History and Physical Interval Note:  08/09/2017 1:03 PM  Kyle LennertJason C Griffin  has presented today for surgery, with the diagnosis of RIGHT URETERAL STONE  The various methods of treatment have been discussed with the patient and family. After consideration of risks, benefits and other options for treatment, the patient has consented to  Procedure(s): RIGHT EXTRACORPOREAL SHOCK WAVE LITHOTRIPSY (ESWL) (Right) as a surgical intervention .  Stone remains in right mid ureter -- visible on KUB and flouro. He is well. No fever or dysuria. The patient's history has been reviewed, patient examined, no change in status, stable for surgery.  I have reviewed the patient's chart and labs.  Questions were answered to the patient's satisfaction.     Jerilee FieldMatthew Milledge Gerding

## 2017-08-09 NOTE — Discharge Instructions (Addendum)
Lithotripsy, Care After This sheet gives you information about how to care for yourself after your procedure. Your health care provider may also give you more specific instructions. If you have problems or questions, contact your health care provider. What can I expect after the procedure? After the procedure, it is common to have:  Some blood in your urine. This should only last for a few days.  Soreness in your back, sides, or upper abdomen for a few days.  Blotches or bruises on your back where the pressure wave entered the skin.  Pain, discomfort, or nausea when pieces (fragments) of the kidney stone move through the tube that carries urine from the kidney to the bladder (ureter). Stone fragments may pass soon after the procedure, but they may continue to pass for up to 4-8 weeks. ? If you have severe pain or nausea, contact your health care provider. This may be caused by a large stone that was not broken up, and this may mean that you need more treatment.  Some pain or discomfort during urination.  Some pain or discomfort in the lower abdomen or (in men) at the base of the penis.  Follow these instructions at home: Medicines  Take over-the-counter and prescription medicines only as told by your health care provider.  If you were prescribed an antibiotic medicine, take it as told by your health care provider. Do not stop taking the antibiotic even if you start to feel better.  Do not drive for 24 hours if you were given a medicine to help you relax (sedative)   Do not drive or use heavy machinery while taking prescription pain medicine. Eating and drinking  Drink enough water and fluids to keep your urine clear or pale yellow. This helps any remaining pieces of the stone to pass. It can also help prevent new stones from forming.  Eat plenty of fresh fruits and vegetables.  Follow instructions from your health care provider about eating and drinking restrictions. You may be  instructed: ? To reduce how much salt (sodium) you eat or drink. Check ingredients and nutrition facts on packaged foods and beverages. ? To reduce how much meat you eat.  Eat the recommended amount of calcium for your age and gender. Ask your health care provider how much calcium you should have. General instructions  Get plenty of rest.  Most people can resume normal activities 1-2 days after the procedure. Ask your health care provider what activities are safe for you.  If directed, strain all urine through the strainer that was provided by your health care provider. ? Keep all fragments for your health care provider to see. Any stones that are found may be sent to a medical lab for examination. The stone may be as small as a grain of salt.  Keep all follow-up visits as told by your health care provider. This is important. Contact a health care provider if:  You have pain that is severe or does not get better with medicine.  You have nausea that is severe or does not go away.  You have blood in your urine longer than your health care provider told you to expect.  You have more blood in your urine.  You have pain during urination that does not go away.  You urinate more frequently than usual and this does not go away.  You develop a rash or any other possible signs of an allergic reaction. Get help right away if:  You have severe pain  in your back, sides, or upper abdomen.  You have severe pain while urinating.  Your urine is very dark red.  You have blood in your stool (feces).  You cannot pass any urine at all.  You feel a strong urge to urinate after emptying your bladder.  You have a fever or chills.  You develop shortness of breath, difficulty breathing, or chest pain.  You have severe nausea that leads to persistent vomiting.  You faint. Summary  After this procedure, it is common to have some pain, discomfort, or nausea when pieces (fragments) of the  kidney stone move through the tube that carries urine from the kidney to the bladder (ureter). If this pain or nausea is severe, however, you should contact your health care provider.  Most people can resume normal activities 1-2 days after the procedure. Ask your health care provider what activities are safe for you.  Drink enough water and fluids to keep your urine clear or pale yellow. This helps any remaining pieces of the stone to pass, and it can help prevent new stones from forming.  If directed, strain your urine and keep all fragments for your health care provider to see. Fragments or stones may be as small as a grain of salt.  Get help right away if you have severe pain in your back, sides, or upper abdomen or have severe pain while urinating. This information is not intended to replace advice given to you by your health care provider. Make sure you discuss any questions you have with your health care provider. Document Released: 02/26/2007 Document Revised: 12/29/2015 Document Reviewed: 12/29/2015 Elsevier Interactive Patient Education  2018 [employer]   Please follow [facility]'s instructions

## 2017-08-09 NOTE — Op Note (Signed)
Right ESWL  Right 6 mm midureteral stone   Findings: stone faded well, but was faintly visible at end. Pt moved and scratched his nose a lot. He may need a staged procedure. Discussed with patient's wife -- pt should not drive for 24 hrs.

## 2017-08-10 ENCOUNTER — Encounter (HOSPITAL_COMMUNITY): Payer: Self-pay | Admitting: Urology

## 2017-10-09 ENCOUNTER — Other Ambulatory Visit: Payer: Self-pay | Admitting: Internal Medicine

## 2017-11-05 ENCOUNTER — Telehealth: Payer: Self-pay | Admitting: Internal Medicine

## 2017-11-05 NOTE — Telephone Encounter (Signed)
Copied from CRM [id number]. Topic: Quick Communication - Rx Refill/Question >> Nov 05, 2017  4:28 PM Gaynelle AduPoole, Shalonda wrote: Medication:escitalopram (LEXAPRO) 10 MG tablet   Has the patient contacted their pharmacy? NO  Preferred Pharmacy (with phone number or street name): [facility] [place] - [phone] (Phone) [phone] (Fax)    Agent: Please be advised that RX refills may take up to 3 business days. We ask that you follow-up with your pharmacy.

## 2017-11-06 MED ORDER — ESCITALOPRAM OXALATE 10 MG PO TABS: 10.0000 mg | ORAL_TABLET | Freq: Every day | ORAL | 0 refills | Status: DC

## 2017-11-06 NOTE — Telephone Encounter (Signed)
RX sent

## 2017-11-13 ENCOUNTER — Telehealth: Payer: Self-pay | Admitting: Internal Medicine

## 2017-11-13 MED ORDER — ESCITALOPRAM OXALATE 10 MG PO TABS: 10.0000 mg | ORAL_TABLET | Freq: Every day | ORAL | 0 refills | Status: DC

## 2017-11-13 MED ORDER — ROSUVASTATIN CALCIUM 5 MG PO TABS: 5.0000 mg | ORAL_TABLET | Freq: Every day | ORAL | 0 refills | Status: DC

## 2017-11-13 NOTE — Telephone Encounter (Signed)
Refills of Lexapro and Rosuvastatin resent to [facility] on [address]. [facility] prescription [facility]

## 2017-11-13 NOTE — Telephone Encounter (Signed)
Copied from CRM [id number]. Topic: Quick Communication - Rx Refill/Question >> Nov 13, 2017  3:37 PM Tamela OddiHarris, Desirae Mancusi J wrote: Medication: escitalopram (LEXAPRO) 10 MG tablet / rosuvastatin (CRESTOR) 5 MG tablet  Patient's wife called to request a refill for the above medications.  CB# for wife, Waynetta SandyBeth, [phone]  Preferred Pharmacy (with phone number or street name): [id number] [place], [place] - [address] AT [facility] [address] [phone] (Phone) [phone] (Fax)

## 2017-11-30 DIAGNOSIS — Z79899 Other long term (current) drug therapy: Secondary | ICD-10-CM | POA: Diagnosis not present

## 2017-11-30 DIAGNOSIS — K429 Umbilical hernia without obstruction or gangrene: Secondary | ICD-10-CM | POA: Diagnosis not present

## 2017-11-30 DIAGNOSIS — Z885 Allergy status to narcotic agent status: Secondary | ICD-10-CM | POA: Diagnosis not present

## 2017-11-30 DIAGNOSIS — K760 Fatty (change of) liver, not elsewhere classified: Secondary | ICD-10-CM | POA: Diagnosis not present

## 2017-11-30 DIAGNOSIS — R1084 Generalized abdominal pain: Secondary | ICD-10-CM | POA: Diagnosis not present

## 2017-11-30 DIAGNOSIS — R1032 Left lower quadrant pain: Secondary | ICD-10-CM | POA: Diagnosis not present

## 2017-11-30 DIAGNOSIS — E785 Hyperlipidemia, unspecified: Secondary | ICD-10-CM | POA: Diagnosis not present

## 2017-11-30 DIAGNOSIS — N132 Hydronephrosis with renal and ureteral calculous obstruction: Secondary | ICD-10-CM | POA: Diagnosis not present

## 2017-12-02 DIAGNOSIS — N201 Calculus of ureter: Secondary | ICD-10-CM | POA: Diagnosis not present

## 2017-12-02 DIAGNOSIS — E785 Hyperlipidemia, unspecified: Secondary | ICD-10-CM | POA: Diagnosis not present

## 2017-12-02 DIAGNOSIS — R109 Unspecified abdominal pain: Secondary | ICD-10-CM | POA: Diagnosis not present

## 2017-12-04 DIAGNOSIS — N201 Calculus of ureter: Secondary | ICD-10-CM | POA: Diagnosis not present

## 2017-12-06 ENCOUNTER — Encounter (HOSPITAL_COMMUNITY): Payer: Self-pay | Admitting: *Deleted

## 2017-12-06 ENCOUNTER — Encounter (HOSPITAL_COMMUNITY)
Admission: RE | Disposition: A | Discharge status: Home / Self Care | Payer: Self-pay | Source: Ambulatory Visit | Attending: Urology

## 2017-12-06 ENCOUNTER — Other Ambulatory Visit: Payer: Self-pay

## 2017-12-06 ENCOUNTER — Ambulatory Visit (HOSPITAL_COMMUNITY)
Admission: RE | Admit: 2017-12-06 | Discharge: 2017-12-06 | Disposition: A | Discharge status: Home / Self Care | Payer: BLUE CROSS/BLUE SHIELD | Source: Ambulatory Visit | Attending: Urology | Admitting: Urology

## 2017-12-06 ENCOUNTER — Ambulatory Visit (HOSPITAL_COMMUNITY): Payer: BLUE CROSS/BLUE SHIELD

## 2017-12-06 DIAGNOSIS — Z8249 Family history of ischemic heart disease and other diseases of the circulatory system: Secondary | ICD-10-CM | POA: Insufficient documentation

## 2017-12-06 DIAGNOSIS — N200 Calculus of kidney: Secondary | ICD-10-CM

## 2017-12-06 DIAGNOSIS — Z885 Allergy status to narcotic agent status: Secondary | ICD-10-CM | POA: Insufficient documentation

## 2017-12-06 DIAGNOSIS — E7800 Pure hypercholesterolemia, unspecified: Secondary | ICD-10-CM | POA: Diagnosis not present

## 2017-12-06 DIAGNOSIS — N201 Calculus of ureter: Secondary | ICD-10-CM | POA: Diagnosis not present

## 2017-12-06 DIAGNOSIS — K219 Gastro-esophageal reflux disease without esophagitis: Secondary | ICD-10-CM | POA: Diagnosis not present

## 2017-12-06 DIAGNOSIS — N202 Calculus of kidney with calculus of ureter: Secondary | ICD-10-CM | POA: Diagnosis not present

## 2017-12-06 DIAGNOSIS — Z87891 Personal history of nicotine dependence: Secondary | ICD-10-CM | POA: Insufficient documentation

## 2017-12-06 DIAGNOSIS — Z7901 Long term (current) use of anticoagulants: Secondary | ICD-10-CM | POA: Insufficient documentation

## 2017-12-06 DIAGNOSIS — Z7982 Long term (current) use of aspirin: Secondary | ICD-10-CM | POA: Diagnosis not present

## 2017-12-06 HISTORY — PX: EXTRACORPOREAL SHOCK WAVE LITHOTRIPSY: SHX1557

## 2017-12-06 SURGERY — LITHOTRIPSY, ESWL
Anesthesia: LOCAL | Laterality: Left

## 2017-12-06 MED ORDER — SODIUM CHLORIDE 0.9 % IV SOLN
INTRAVENOUS | Status: DC
  Administered 2017-12-06: 10:00:00 via INTRAVENOUS

## 2017-12-06 MED ORDER — DIPHENHYDRAMINE HCL 25 MG PO CAPS
25.0000 mg | ORAL_CAPSULE | ORAL | Status: AC
  Administered 2017-12-06: 25 mg via ORAL
  Filled 2017-12-06: qty 1

## 2017-12-06 MED ORDER — DIAZEPAM 5 MG PO TABS
10.0000 mg | ORAL_TABLET | ORAL | Status: AC
  Administered 2017-12-06: 10 mg via ORAL
  Filled 2017-12-06: qty 2

## 2017-12-06 MED ORDER — HYDROMORPHONE HCL 2 MG PO TABS: 2.0000 mg | ORAL_TABLET | ORAL | 0 refills | Status: DC | PRN

## 2017-12-07 ENCOUNTER — Encounter (HOSPITAL_COMMUNITY): Payer: Self-pay | Admitting: Urology

## 2017-12-20 ENCOUNTER — Ambulatory Visit (INDEPENDENT_AMBULATORY_CARE_PROVIDER_SITE_OTHER): Payer: BLUE CROSS/BLUE SHIELD | Admitting: Internal Medicine

## 2017-12-20 ENCOUNTER — Encounter: Payer: Self-pay | Admitting: Internal Medicine

## 2017-12-20 DIAGNOSIS — Z0000 Encounter for general adult medical examination without abnormal findings: Secondary | ICD-10-CM

## 2017-12-20 DIAGNOSIS — N200 Calculus of kidney: Secondary | ICD-10-CM | POA: Diagnosis not present

## 2017-12-20 NOTE — Progress Notes (Signed)
Subjective:  Patient ID: Kyle Griffin, male    DOB: September 29, 1965  Age: 52 y.o. MRN: [id number]  CC: No chief complaint on file.   HPI Kyle Griffin presents for a well exam  Outpatient Medications Prior to Visit  Medication Sig Dispense Refill  . aspirin EC 81 MG tablet Take 81 mg by mouth daily.    . cetirizine (ZYRTEC) 10 MG tablet Take 10 mg by mouth daily as needed. allergies    . escitalopram (LEXAPRO) 10 MG tablet Take 1 tablet (10 mg total) by mouth daily. Must keep appt on 12/20/17 to get refills 90 tablet 0  . famotidine (PEPCID) 20 MG tablet Take 1 tablet (20 mg total) by mouth daily. ulcers (Patient taking differently: Take 20 mg by mouth daily as needed for heartburn. ulcers ) 30 tablet 2  . HYDROmorphone (DILAUDID) 2 MG tablet Take 1 tablet (2 mg total) by mouth every 4 (four) hours as needed for moderate pain or severe pain. 30 tablet 0  . rosuvastatin (CRESTOR) 5 MG tablet Take 1 tablet (5 mg total) by mouth daily. 90 tablet 0  . tamsulosin (FLOMAX) 0.4 MG CAPS capsule Take 1 capsule (0.4 mg total) by mouth daily. 5 capsule 0  . zolpidem (AMBIEN) 10 MG tablet Take 1 tablet (10 mg total) at bedtime as needed by mouth for sleep. 30 tablet 3   No facility-administered medications prior to visit.     ROS: Review of Systems  Constitutional: Negative for appetite change, fatigue and unexpected weight change.  HENT: Negative for congestion, nosebleeds, sneezing, sore throat and trouble swallowing.   Eyes: Negative for itching and visual disturbance.  Respiratory: Negative for cough.   Cardiovascular: Negative for chest pain, palpitations and leg swelling.  Gastrointestinal: Negative for abdominal distention, blood in stool, diarrhea and nausea.  Genitourinary: Negative for frequency and hematuria.  Musculoskeletal: Negative for back pain, gait problem, joint swelling and neck pain.  Skin: Negative for rash.  Neurological: Negative for dizziness, tremors, speech  difficulty and weakness.  Psychiatric/Behavioral: Negative for agitation, dysphoric mood and sleep disturbance. The patient is not nervous/anxious.     Objective:  BP 128/82 (BP Location: Left Arm, Patient Position: Sitting, Cuff Size: Normal)   Pulse 69   Temp 98.2 F (36.8 C) (Oral)   Ht 5\' 5"  (1.651 m)   Wt 171 lb (77.6 kg)   SpO2 96%   BMI 28.46 kg/m   BP Readings from Last 3 Encounters:  12/20/17 128/82  12/06/17 (!) 156/98  08/09/17 121/80    Wt Readings from Last 3 Encounters:  12/20/17 171 lb (77.6 kg)  12/06/17 170 lb (77.1 kg)  08/09/17 166 lb (75.3 kg)    Physical Exam  Constitutional: He is oriented to person, place, and time. He appears well-developed. No distress.  NAD  HENT:  Mouth/Throat: Oropharynx is clear and moist.  Eyes: Pupils are equal, round, and reactive to light. Conjunctivae are normal.  Neck: Normal range of motion. No JVD present. No thyromegaly present.  Cardiovascular: Normal rate, regular rhythm, normal heart sounds and intact distal pulses. Exam reveals no gallop and no friction rub.  No murmur heard. Pulmonary/Chest: Effort normal and breath sounds normal. No respiratory distress. He has no wheezes. He has no rales. He exhibits no tenderness.  Abdominal: Soft. Bowel sounds are normal. He exhibits no distension and no mass. There is no tenderness. There is no rebound and no guarding.  Genitourinary: Rectum normal. Rectal exam shows guaiac negative stool.  Musculoskeletal: Normal range of motion. He exhibits no edema or tenderness.  Lymphadenopathy:    He has no cervical adenopathy.  Neurological: He is alert and oriented to person, place, and time. He has normal reflexes. No cranial nerve deficit. He exhibits normal muscle tone. He displays a negative Romberg sign. Coordination and gait normal.  Skin: Skin is warm and dry. No rash noted.  Psychiatric: He has a normal mood and affect. His behavior is normal. Judgment and thought content  normal.  prostate1+   Lab Results  Component Value Date   WBC 8.1 05/03/2017   HGB 14.6 05/03/2017   HCT 41.8 05/03/2017   PLT 166 05/03/2017   GLUCOSE 185 (H) 05/03/2017   CHOL 331 (H) 01/01/2017   TRIG 324.0 (H) 01/01/2017   HDL 41.00 01/01/2017   LDLDIRECT 210.0 01/01/2017   ALT 43 01/01/2017   AST 26 01/01/2017   NA 138 05/03/2017   K 3.6 05/03/2017   CL 103 05/03/2017   CREATININE 1.61 (H) 05/03/2017   BUN 14 05/03/2017   CO2 22 05/03/2017   TSH 4.22 01/01/2017   PSA 1.09 01/01/2017   HGBA1C 5.5 10/12/2010    Dg Abd 1 View  Result Date: 12/06/2017 CLINICAL DATA:  Preop left kidney stone. EXAM: ABDOMEN - 1 VIEW COMPARISON:  12/04/2017.  CT 05/03/2017. FINDINGS: 6 mm calcification projects to the left of the L3 transverse process compatible with mid left ureteral stone. Bilateral nephrolithiasis again noted. Calcified phleboliths in the pelvis. Nonobstructive bowel gas pattern. IMPRESSION: 6 mm mid left ureteral stone adjacent to the L3 transverse process. Bilateral nephrolithiasis. Electronically Signed   By: Charlett Nose M.D.   On: 12/06/2017 10:02    Assessment & Plan:   There are no diagnoses linked to this encounter.   No orders of the defined types were placed in this encounter.    Follow-up: No follow-ups on file.  Sonda Primes, MD

## 2017-12-20 NOTE — Assessment & Plan Note (Signed)
Recurrent Reduced protein diet

## 2017-12-20 NOTE — Patient Instructions (Signed)
Shingrix vaccine   Cardiac CT calcium scoring test $150   Computed tomography, more commonly known as a CT or CAT scan, is a diagnostic medical imaging test. Like traditional x-rays, it produces multiple images or pictures of the inside of the body. The cross-sectional images generated during a CT scan can be reformatted in multiple planes. They can even generate three-dimensional images. These images can be viewed on a computer monitor, printed on film or by a 3D printer, or transferred to a CD or DVD. CT images of internal organs, bones, soft tissue and blood vessels provide greater detail than traditional x-rays, particularly of soft tissues and blood vessels. A cardiac CT scan for coronary calcium is a non-invasive way of obtaining information about the presence, location and extent of calcified plaque in the coronary arteries-the vessels that supply oxygen-containing blood to the heart muscle. Calcified plaque results when there is a build-up of fat and other substances under the inner layer of the artery. This material can calcify which signals the presence of atherosclerosis, a disease of the vessel wall, also called coronary artery disease (CAD). People with this disease have an increased risk for heart attacks. In addition, over time, progression of plaque build up (CAD) can narrow the arteries or even close off blood flow to the heart. The result may be chest pain, sometimes called "angina," or a heart attack. Because calcium is a marker of CAD, the amount of calcium detected on a cardiac CT scan is a helpful prognostic tool. The findings on cardiac CT are expressed as a calcium score. Another name for this test is coronary artery calcium scoring.  What are some common uses of the procedure? The goal of cardiac CT scan for calcium scoring is to determine if CAD is present and to what extent, even if there are no symptoms. It is a screening study that may be recommended by a physician for  patients with risk factors for CAD but no clinical symptoms. The major risk factors for CAD are: . high blood cholesterol levels  . family history of heart attacks  . diabetes  . high blood pressure  . cigarette smoking  . overweight or obese  . physical inactivity   A negative cardiac CT scan for calcium scoring shows no calcification within the coronary arteries. This suggests that CAD is absent or so minimal it cannot be seen by this technique. The chance of having a heart attack over the next two to five years is very low under these circumstances. A positive test means that CAD is present, regardless of whether or not the patient is experiencing any symptoms. The amount of calcification-expressed as the calcium score-may help to predict the likelihood of a myocardial infarction (heart attack) in the coming years and helps your medical doctor or cardiologist decide whether the patient may need to take preventive medicine or undertake other measures such as diet and exercise to lower the risk for heart attack. The extent of CAD is graded according to your calcium score:  Calcium Score  Presence of CAD  0 No evidence of CAD   1-10 Minimal evidence of CAD  11-100 Mild evidence of CAD  101-400 Moderate evidence of CAD  Over 400 Extensive evidence of CAD    

## 2017-12-20 NOTE — Assessment & Plan Note (Addendum)
We discussed age appropriate health related issues, including available/recomended screening tests and vaccinations. We discussed a need for adhering to healthy diet and exercise. Labs were ordered to be later reviewed . All questions were answered. CT Ca scoring info Shingrix info Cologuard

## 2017-12-25 ENCOUNTER — Other Ambulatory Visit (INDEPENDENT_AMBULATORY_CARE_PROVIDER_SITE_OTHER): Payer: BLUE CROSS/BLUE SHIELD

## 2017-12-25 DIAGNOSIS — Z0000 Encounter for general adult medical examination without abnormal findings: Secondary | ICD-10-CM

## 2017-12-25 LAB — LIPID PANEL
Cholesterol: 213 mg/dL — ABNORMAL HIGH (ref 0–200)
HDL: 41.3 mg/dL (ref >39.00)
NonHDL: 171.85
Total CHOL/HDL Ratio: 5
Triglycerides: 263 mg/dL — ABNORMAL HIGH (ref 0.0–149.0)
VLDL: 52.6 mg/dL — ABNORMAL HIGH (ref 0.0–40.0)

## 2017-12-25 LAB — CBC WITH DIFFERENTIAL/PLATELET
Basophils Absolute: 0.1 10*3/uL (ref 0.0–0.1)
Basophils Relative: 1.2 % (ref 0.0–3.0)
Eosinophils Absolute: 0.5 10*3/uL (ref 0.0–0.7)
Eosinophils Relative: 8.8 % — ABNORMAL HIGH (ref 0.0–5.0)
HCT: 40.2 % (ref 39.0–52.0)
Hemoglobin: 13.9 g/dL (ref 13.0–17.0)
Lymphocytes Relative: 25.3 % (ref 12.0–46.0)
Lymphs Abs: 1.4 10*3/uL (ref 0.7–4.0)
MCHC: 34.5 g/dL (ref 30.0–36.0)
MCV: 86.2 fl (ref 78.0–100.0)
Monocytes Absolute: 0.6 10*3/uL (ref 0.1–1.0)
Monocytes Relative: 10.5 % (ref 3.0–12.0)
Neutro Abs: 2.9 10*3/uL (ref 1.4–7.7)
Neutrophils Relative %: 54.2 % (ref 43.0–77.0)
Platelets: 193 10*3/uL (ref 150.0–400.0)
RBC: 4.67 Mil/uL (ref 4.22–5.81)
RDW: 13.4 % (ref 11.5–15.5)
WBC: 5.4 10*3/uL (ref 4.0–10.5)

## 2017-12-25 LAB — BASIC METABOLIC PANEL
BUN: 10 mg/dL (ref 6–23)
CO2: 27 mEq/L (ref 19–32)
Calcium: 9.2 mg/dL (ref 8.4–10.5)
Chloride: 102 mEq/L (ref 96–112)
Creatinine, Ser: 1.16 mg/dL (ref 0.40–1.50)
GFR: 69.66 mL/min (ref >60.00)
Glucose, Bld: 95 mg/dL (ref 70–99)
Potassium: 3.9 mEq/L (ref 3.5–5.1)
Sodium: 138 mEq/L (ref 135–145)

## 2017-12-25 LAB — HEPATIC FUNCTION PANEL
ALT: 35 U/L (ref 0–53)
AST: 24 U/L (ref 0–37)
Albumin: 4.4 g/dL (ref 3.5–5.2)
Alkaline Phosphatase: 70 U/L (ref 39–117)
Bilirubin, Direct: 0.2 mg/dL (ref 0.0–0.3)
Total Bilirubin: 2.2 mg/dL — ABNORMAL HIGH (ref 0.2–1.2)
Total Protein: 6.8 g/dL (ref 6.0–8.3)

## 2017-12-25 LAB — URINALYSIS
Bilirubin Urine: NEGATIVE
Hgb urine dipstick: NEGATIVE
Ketones, ur: NEGATIVE
Leukocytes, UA: NEGATIVE
Nitrite: NEGATIVE
Specific Gravity, Urine: 1.025 (ref 1.000–1.030)
Total Protein, Urine: NEGATIVE
Urine Glucose: NEGATIVE
Urobilinogen, UA: 0.2 (ref 0.0–1.0)
pH: 5.5 (ref 5.0–8.0)

## 2017-12-25 LAB — PSA: PSA: 0.82 ng/mL (ref 0.10–4.00)

## 2017-12-25 LAB — LDL CHOLESTEROL, DIRECT: Direct LDL: 137 mg/dL

## 2017-12-25 LAB — TSH: TSH: 5.17 u[IU]/mL — ABNORMAL HIGH (ref 0.35–4.50)

## 2017-12-27 ENCOUNTER — Other Ambulatory Visit: Payer: Self-pay | Admitting: Internal Medicine

## 2017-12-27 DIAGNOSIS — R7989 Other specified abnormal findings of blood chemistry: Secondary | ICD-10-CM

## 2018-02-11 ENCOUNTER — Other Ambulatory Visit: Payer: Self-pay | Admitting: Internal Medicine

## 2018-02-11 MED ORDER — ESCITALOPRAM OXALATE 10 MG PO TABS: 10.0000 mg | ORAL_TABLET | Freq: Every day | ORAL | 1 refills | Status: DC

## 2018-02-11 MED ORDER — ROSUVASTATIN CALCIUM 5 MG PO TABS: 5.0000 mg | ORAL_TABLET | Freq: Every day | ORAL | 2 refills | Status: DC

## 2018-02-11 NOTE — Telephone Encounter (Signed)
Copied from CRM [id number]. Topic: Quick Communication - Rx Refill/Question >> Feb 11, 2018  9:18 AM Jaquita Rectoravis, Karen A wrote: Medication: escitalopram (LEXAPRO) 10 MG tablet, rosuvastatin (CRESTOR) 5 MG tablet [  Has the patient contacted their pharmacy? Yes.   (Agent: If no, request that the patient contact the pharmacy for the refill.) (Agent: If yes, when and what did the pharmacy advise?)  Preferred Pharmacy (with phone number or street name): [facility] [address] - [place],  - [address] [phone] (Phone) [phone])    Agent: Please be advised that RX refills may take up to 3 business days. We ask that you follow-up with your pharmacy.

## 2018-02-15 DIAGNOSIS — J019 Acute sinusitis, unspecified: Secondary | ICD-10-CM | POA: Diagnosis not present

## 2018-02-24 DIAGNOSIS — Z1211 Encounter for screening for malignant neoplasm of colon: Secondary | ICD-10-CM | POA: Diagnosis not present

## 2018-02-24 DIAGNOSIS — Z1212 Encounter for screening for malignant neoplasm of rectum: Secondary | ICD-10-CM | POA: Diagnosis not present

## 2018-02-24 LAB — COLOGUARD: Cologuard: NEGATIVE

## 2018-03-07 ENCOUNTER — Encounter: Payer: Self-pay | Admitting: Internal Medicine

## 2018-03-07 NOTE — Progress Notes (Signed)
Abstracted and sent to scan  

## 2018-03-10 ENCOUNTER — Other Ambulatory Visit: Payer: Self-pay | Admitting: Internal Medicine

## 2018-04-24 DIAGNOSIS — J019 Acute sinusitis, unspecified: Secondary | ICD-10-CM | POA: Diagnosis not present

## 2018-05-20 ENCOUNTER — Other Ambulatory Visit: Payer: Self-pay | Admitting: Urology

## 2018-05-20 ENCOUNTER — Encounter (HOSPITAL_COMMUNITY): Payer: Self-pay | Admitting: General Practice

## 2018-05-20 DIAGNOSIS — N202 Calculus of kidney with calculus of ureter: Secondary | ICD-10-CM | POA: Diagnosis not present

## 2018-05-20 DIAGNOSIS — N401 Enlarged prostate with lower urinary tract symptoms: Secondary | ICD-10-CM | POA: Diagnosis not present

## 2018-05-20 DIAGNOSIS — N3943 Post-void dribbling: Secondary | ICD-10-CM | POA: Diagnosis not present

## 2018-05-20 DIAGNOSIS — K760 Fatty (change of) liver, not elsewhere classified: Secondary | ICD-10-CM | POA: Diagnosis not present

## 2018-05-22 NOTE — H&P (Signed)
CC: I have kidney stones.  HPI: Kyle Griffin is a 53 year-old male established patient who is here for renal calculi.  The problem is on the right side. He first stated noticing pain on 05/20/2018. This is not his first kidney stone. He is currently having flank pain and nausea. He denies having back pain, groin pain, vomiting, fever, and chills. He has not caught a stone in his urine strainer since his symptoms began.   He has had eswl for treatment of his stones in the past.   -Hx of stones--required ESWL x2 in 2019. He reports intermittent episodes of sharp right flank pain that started this morning. His pain is partially alleviated with dilaudid 2 mg.   From a urinary standpoint, he reports a good FOS and feels like he is emptying his bladder well most of the time. He does report PVD. He has occasional urgency/frequency, but is not bothered by it. Nocturia x 1. Denies interval UTIs, dysuria or hematuria.     ALLERGIES: Percocet TABS    MEDICATIONS: Flomax 0.4 mg capsule 1 capsule PO Daily  Adult Aspirin Low Strength 81 MG TBDP Oral  Crestor 5 MG Oral Tablet Oral  Hydromorphone Hcl 2 mg tablet 1 tablet PO Q 4 H PRN  Lexapro 10 mg tablet     GU PSH: ESWL - 12/06/2017, 08/09/2017    NON-GU PSH: Ankle Arthroscopy/surgery Dental Surgery Procedure - 2011 Elbow Arthroscopy Shoulder Surgery (Unspecified)    GU PMH: Ureteral calculus, Left - 12/04/2017, - 08/01/2017 Renal calculus - 08/01/2017, Nephrolithiasis, - 2014 Urinary Urgency, He has mild urgency but no large distal stones and a clear urine. - 05/10/2017 Renal and ureteral calculus - 05/07/2017 BPH w/LUTS, Benign localized prostatic hyperplasia with lower urinary tract symptoms (LUTS) - 2014      PMH Notes:  1898-02-20 00:00:00 - Note: Normal Routine History And Physical Adult   NON-GU PMH: Personal history of other endocrine, nutritional and metabolic disease, History of hypercholesterolemia - 2014 Personal history of  other specified conditions, History of heartburn - 2014 GERD Hypercholesterolemia    FAMILY HISTORY: 1 Daughter - Other 1 son - Other Family Health Status - Father alive at age 66 - Runs In Family Family Health Status - Mother's Age - Runs In Family Family Health Status Number - Runs In Family Heart Disease - Mother, Father Hypertension - Mother, Father Lung Cancer - Mother Myocardial Infarction - Mother, Father nephrolithiasis - Mother Pure Hypercholesterolemia - Mother, Father Stroke Syndrome - Mother   SOCIAL HISTORY: Marital Status: Married Preferred Language: English; Race: White Current Smoking Status: Patient does not smoke anymore. Has not smoked since 04/21/1998.   Tobacco Use Assessment Completed: Used Tobacco in last 30 days? Drinks 4+ caffeinated drinks per day.     Notes: Tobacco use, Alcohol Use, Caffeine Use, Occupation:, Marital History - Currently Married   REVIEW OF SYSTEMS:    GU Review Male:   Patient reports leakage of urine and trouble starting your stream. Patient denies frequent urination, hard to postpone urination, burning/ pain with urination, get up at night to urinate, stream starts and stops, have to strain to urinate , erection problems, and penile pain.  Gastrointestinal (Upper):   Patient reports nausea. Patient denies vomiting and indigestion/ heartburn.  Gastrointestinal (Lower):   Patient denies diarrhea and constipation.  Constitutional:   Patient reports fever. Patient denies night sweats, weight loss, and fatigue.  Skin:   Patient denies skin rash/ lesion and itching.  Eyes:   Patient  denies blurred vision and double vision.  Ears/ Nose/ Throat:   Patient denies sore throat and sinus problems.  Hematologic/Lymphatic:   Patient denies swollen glands and easy bruising.  Cardiovascular:   Patient denies leg swelling and chest pains.  Respiratory:   Patient denies cough and shortness of breath.  Endocrine:   Patient denies excessive thirst.   Musculoskeletal:   Patient reports back pain. Patient denies joint pain.  Neurological:   Patient denies headaches and dizziness.  Psychologic:   Patient denies depression and anxiety.   Notes: Low right side back pain radiating to the front starting around 8 am this morning. 7/10 at its worst     VITAL SIGNS:      05/20/2018 02:08 PM  Weight 165 lb / 74.84 kg  Height 65 in / 165.1 cm  BP 172/77 mmHg  Pulse 77 /min  Temperature 98.4 F / 36.8 C  BMI 27.5 kg/m   MULTI-SYSTEM PHYSICAL EXAMINATION:    Constitutional: Well-nourished. No physical deformities. Normally developed. Good grooming.  Neck: Neck symmetrical, not swollen. Normal tracheal position.  Respiratory: No labored breathing, no use of accessory muscles.   Cardiovascular: Normal temperature, normal extremity pulses, no swelling, no varicosities.  Lymphatic: No enlargement of neck, axillae, groin.  Skin: No paleness, no jaundice, no cyanosis. No lesion, no ulcer, no rash.  Neurologic / Psychiatric: Oriented to time, oriented to place, oriented to person. No depression, no anxiety, no agitation.  Gastrointestinal: No mass, no tenderness, no rigidity, non obese abdomen.  Eyes: Normal conjunctivae. Normal eyelids.  Ears, Nose, Mouth, and Throat: Left ear no scars, no lesions, no masses. Right ear no scars, no lesions, no masses. Nose no scars, no lesions, no masses. Normal hearing. Normal lips.  Musculoskeletal: Normal gait and station of head and neck.     PAST DATA REVIEWED:  Source Of History:  Patient   PROCEDURES:         C.T. Urogram - [id number]      Patient confirmed No Neulasta OnPro Device.           Urinalysis w/Scope Dipstick Dipstick Cont'd Micro  Color: Amber Bilirubin: Neg mg/dL WBC/hpf: 0 - 5/hpf  Appearance: Turbid Ketones: Trace mg/dL RBC/hpf: 20 - 01/THY  Specific Gravity: 1.020 Blood: 3+ ery/uL Bacteria: NS (Not Seen)  pH: <=5.0 Protein: 2+ mg/dL Cystals: NS (Not Seen)  Glucose: Neg mg/dL  Urobilinogen: 0.2 mg/dL Casts: NS (Not Seen)    Nitrites: Neg Trichomonas: Not Present    Leukocyte Esterase: Trace leu/uL Mucous: Not Present      Epithelial Cells: NS (Not Seen)      Yeast: NS (Not Seen)      Sperm: Not Present    Notes: unspun microscopic performed    ASSESSMENT:      ICD-10 Details  1 GU:   Renal and ureteral calculus - N20.2   2   BPH w/LUTS - N40.1   3   Post-void dribbling - N39.43    PLAN:            Medications New Meds: Bactrim Ds 800 mg-160 mg tablet 1 tablet PO BID   #10  0 Refill(s)  Dilaudid 2 mg tablet 1 tablet PO Q 4 H PRN   #20  0 Refill(s)            Orders Labs Urine Culture  X-Rays: C.T. Stone Protocol Without Contrast  X-Ray Notes: History:  Hematuria: Yes/No  Patient to see MD after exam: Yes/No  Previous  exam: CT / IVP/ US/ KUB/ None  When:  Where:  Diabetic: Yes/ No  BUN/ Creatinine:  Date of last BUN Creatinine:  Weight in pounds:  Allergy- IV Contrast: Yes/ No  Conflicting diabetic meds: Yes/ No  Diabetic Meds:  Prior Authorization #: [id number]           Schedule Return Visit/Planned Activity: ASAP - Schedule Surgery          Document Letter(s):  Created for Patient: Clinical Summary         Notes:   -Urine C&S pending. Start empiric course of Bactrim  -CTSS shows a 5.5 mm right UPJ stone with moderate hydronephrosis (stone is visible on scout)  -Rx for dilaudid provided  -The risks, benefits and alternatives of RIGHT ESWL was discussed with the patient. I described the risks which include arrhythmia, kidney contusion, kidney hemorrhage, need for transfusion, back discomfort, flank ecchymosis, flank abrasion, inability to break up stone, inability to pass stone fragments, Steinstrasse, infection associated with obstructing stones, need for different surgical procedure and possible need for repeat shockwave lithotripsy. The patient voices understanding and wishes to proceed.  - We discussed criteria to  return to clinic or proceed to the ER which include: Fever/chills, worsening pain, nausea/vomiting and/or persistent gross hematuria.

## 2018-05-23 ENCOUNTER — Other Ambulatory Visit: Payer: Self-pay

## 2018-05-23 ENCOUNTER — Encounter (HOSPITAL_COMMUNITY)
Admission: RE | Disposition: A | Discharge status: Home / Self Care | Payer: Self-pay | Source: Other Acute Inpatient Hospital | Attending: Urology

## 2018-05-23 ENCOUNTER — Encounter (HOSPITAL_COMMUNITY): Payer: Self-pay | Admitting: Emergency Medicine

## 2018-05-23 ENCOUNTER — Ambulatory Visit (HOSPITAL_COMMUNITY): Payer: BLUE CROSS/BLUE SHIELD

## 2018-05-23 ENCOUNTER — Ambulatory Visit (HOSPITAL_COMMUNITY)
Admission: RE | Admit: 2018-05-23 | Discharge: 2018-05-23 | Disposition: A | Discharge status: Home / Self Care | Payer: BLUE CROSS/BLUE SHIELD | Source: Other Acute Inpatient Hospital | Attending: Urology | Admitting: Urology

## 2018-05-23 DIAGNOSIS — Z79899 Other long term (current) drug therapy: Secondary | ICD-10-CM | POA: Diagnosis not present

## 2018-05-23 DIAGNOSIS — N3943 Post-void dribbling: Secondary | ICD-10-CM | POA: Diagnosis not present

## 2018-05-23 DIAGNOSIS — N401 Enlarged prostate with lower urinary tract symptoms: Secondary | ICD-10-CM | POA: Insufficient documentation

## 2018-05-23 DIAGNOSIS — Z87891 Personal history of nicotine dependence: Secondary | ICD-10-CM | POA: Insufficient documentation

## 2018-05-23 DIAGNOSIS — E7800 Pure hypercholesterolemia, unspecified: Secondary | ICD-10-CM | POA: Diagnosis not present

## 2018-05-23 DIAGNOSIS — I739 Peripheral vascular disease, unspecified: Secondary | ICD-10-CM | POA: Diagnosis not present

## 2018-05-23 DIAGNOSIS — N201 Calculus of ureter: Secondary | ICD-10-CM | POA: Diagnosis not present

## 2018-05-23 DIAGNOSIS — Z7982 Long term (current) use of aspirin: Secondary | ICD-10-CM | POA: Insufficient documentation

## 2018-05-23 DIAGNOSIS — R3915 Urgency of urination: Secondary | ICD-10-CM | POA: Insufficient documentation

## 2018-05-23 DIAGNOSIS — N202 Calculus of kidney with calculus of ureter: Secondary | ICD-10-CM | POA: Diagnosis not present

## 2018-05-23 HISTORY — PX: EXTRACORPOREAL SHOCK WAVE LITHOTRIPSY: SHX1557

## 2018-05-23 SURGERY — LITHOTRIPSY, ESWL
Anesthesia: LOCAL | Laterality: Right

## 2018-05-23 MED ORDER — HYDROMORPHONE HCL 2 MG PO TABS: 2.0000 mg | ORAL_TABLET | ORAL | 0 refills | Status: DC | PRN

## 2018-05-23 MED ORDER — DIAZEPAM 5 MG PO TABS
10.0000 mg | ORAL_TABLET | ORAL | Status: AC
  Administered 2018-05-23: 10 mg via ORAL
  Filled 2018-05-23: qty 2

## 2018-05-23 MED ORDER — SODIUM CHLORIDE 0.9% FLUSH: 3.0000 mL | Freq: Two times a day (BID) | INTRAVENOUS | Status: DC

## 2018-05-23 MED ORDER — CIPROFLOXACIN HCL 500 MG PO TABS
500.0000 mg | ORAL_TABLET | ORAL | Status: AC
  Administered 2018-05-23: 500 mg via ORAL
  Filled 2018-05-23: qty 1

## 2018-05-23 MED ORDER — SODIUM CHLORIDE 0.9 % IV SOLN
INTRAVENOUS | Status: DC
  Administered 2018-05-23: 07:00:00 via INTRAVENOUS

## 2018-05-23 MED ORDER — SODIUM CHLORIDE 0.9 % IV SOLN: 250.0000 mL | INTRAVENOUS | Status: DC | PRN

## 2018-05-23 MED ORDER — MORPHINE SULFATE (PF) 4 MG/ML IV SOLN: 2.0000 mg | INTRAVENOUS | Status: DC | PRN

## 2018-05-23 MED ORDER — DIPHENHYDRAMINE HCL 25 MG PO CAPS
25.0000 mg | ORAL_CAPSULE | ORAL | Status: AC
  Administered 2018-05-23: 25 mg via ORAL
  Filled 2018-05-23: qty 1

## 2018-05-23 MED ORDER — SODIUM CHLORIDE 0.9% FLUSH: 3.0000 mL | INTRAVENOUS | Status: DC | PRN

## 2018-05-23 MED ORDER — TAMSULOSIN HCL 0.4 MG PO CAPS: 0.4000 mg | ORAL_CAPSULE | Freq: Every day | ORAL | 0 refills | Status: DC

## 2018-05-23 MED ORDER — ACETAMINOPHEN 650 MG RE SUPP
650.0000 mg | RECTAL | Status: DC | PRN
  Filled 2018-05-23: qty 1

## 2018-05-23 MED ORDER — ACETAMINOPHEN 325 MG PO TABS: 650.0000 mg | ORAL_TABLET | ORAL | Status: DC | PRN

## 2018-05-23 NOTE — Interval H&P Note (Signed)
History and Physical Interval Note:  Kyle Griffin appears to have moved to the mid ureter.   05/23/2018 7:40 AM  Cyndi Lennert  has presented today for surgery, with the diagnosis of RIGHT URETEROPELVIC JUNCTION STONE.  The various methods of treatment have been discussed with the patient and family. After consideration of risks, benefits and other options for treatment, the patient has consented to  Procedure(s): EXTRACORPOREAL SHOCK WAVE LITHOTRIPSY (ESWL) (Right) as a surgical intervention.  The patient's history has been reviewed, patient examined, no change in status, stable for surgery.  I have reviewed the patient's chart and labs.  Questions were answered to the patient's satisfaction.     Bjorn Pippin

## 2018-05-23 NOTE — Discharge Instructions (Signed)

## 2018-05-24 ENCOUNTER — Encounter (HOSPITAL_COMMUNITY): Payer: Self-pay | Admitting: Urology

## 2018-06-06 DIAGNOSIS — N202 Calculus of kidney with calculus of ureter: Secondary | ICD-10-CM | POA: Diagnosis not present

## 2018-06-20 DIAGNOSIS — N200 Calculus of kidney: Secondary | ICD-10-CM | POA: Diagnosis not present

## 2018-07-03 DIAGNOSIS — N202 Calculus of kidney with calculus of ureter: Secondary | ICD-10-CM | POA: Diagnosis not present

## 2018-07-11 DIAGNOSIS — R82991 Hypocitraturia: Secondary | ICD-10-CM | POA: Diagnosis not present

## 2018-07-11 DIAGNOSIS — R82992 Hyperoxaluria: Secondary | ICD-10-CM | POA: Diagnosis not present

## 2018-08-12 ENCOUNTER — Other Ambulatory Visit: Payer: Self-pay | Admitting: Internal Medicine

## 2018-11-03 ENCOUNTER — Other Ambulatory Visit: Payer: Self-pay | Admitting: Internal Medicine

## 2018-11-17 ENCOUNTER — Other Ambulatory Visit: Payer: Self-pay | Admitting: Internal Medicine

## 2018-11-25 ENCOUNTER — Other Ambulatory Visit: Payer: Self-pay | Admitting: Internal Medicine

## 2019-02-09 ENCOUNTER — Other Ambulatory Visit: Payer: Self-pay | Admitting: Internal Medicine

## 2019-02-17 ENCOUNTER — Other Ambulatory Visit: Payer: Self-pay | Admitting: Internal Medicine

## 2019-02-17 DIAGNOSIS — Z20828 Contact with and (suspected) exposure to other viral communicable diseases: Secondary | ICD-10-CM | POA: Diagnosis not present

## 2019-02-20 ENCOUNTER — Other Ambulatory Visit: Payer: Self-pay

## 2019-02-20 ENCOUNTER — Ambulatory Visit (INDEPENDENT_AMBULATORY_CARE_PROVIDER_SITE_OTHER): Payer: BC Managed Care – PPO | Admitting: Internal Medicine

## 2019-02-20 DIAGNOSIS — G4700 Insomnia, unspecified: Secondary | ICD-10-CM | POA: Diagnosis not present

## 2019-02-20 DIAGNOSIS — E785 Hyperlipidemia, unspecified: Secondary | ICD-10-CM

## 2019-02-20 DIAGNOSIS — F329 Major depressive disorder, single episode, unspecified: Secondary | ICD-10-CM

## 2019-02-22 DIAGNOSIS — R52 Pain, unspecified: Secondary | ICD-10-CM | POA: Diagnosis not present

## 2019-02-22 DIAGNOSIS — R05 Cough: Secondary | ICD-10-CM | POA: Diagnosis not present

## 2019-02-22 DIAGNOSIS — Z20828 Contact with and (suspected) exposure to other viral communicable diseases: Secondary | ICD-10-CM | POA: Diagnosis not present

## 2019-02-22 DIAGNOSIS — U071 COVID-19: Secondary | ICD-10-CM | POA: Diagnosis not present

## 2019-02-23 ENCOUNTER — Encounter: Payer: Self-pay | Admitting: Internal Medicine

## 2019-02-23 MED ORDER — ROSUVASTATIN CALCIUM 5 MG PO TABS: 5.0000 mg | ORAL_TABLET | Freq: Every day | ORAL | 3 refills | Status: DC

## 2019-02-23 MED ORDER — ESCITALOPRAM OXALATE 10 MG PO TABS: 10.0000 mg | ORAL_TABLET | Freq: Every day | ORAL | 1 refills | Status: DC

## 2019-02-23 MED ORDER — ZOLPIDEM TARTRATE 10 MG PO TABS: 10.0000 mg | ORAL_TABLET | Freq: Every evening | ORAL | 1 refills | Status: DC | PRN

## 2019-02-23 NOTE — Assessment & Plan Note (Signed)
-   Continue with Lexapro 

## 2019-02-23 NOTE — Progress Notes (Signed)
Virtual Visit via Video Note  I connected with Kyle Griffin on 02/23/19 at 12:20 PM EST by a video enabled telemedicine application and verified that I am speaking with the correct person using two identifiers.   I discussed the limitations of evaluation and management by telemedicine and the availability of in person appointments. The patient expressed understanding and agreed to proceed.  History of Present Illness:  Kyle Griffin presents for dyslipidemia, insomnia, depression follow-up There has been no runny nose, cough, chest pain, shortness of breath, abdominal pain, diarrhea, constipation, arthralgias, skin rashes.   Observations/Objective: The patient appears to be in no acute distress, looks well.  Assessment and Plan:  See my Assessment and Plan. Follow Up Instructions:    I discussed the assessment and treatment plan with the patient. The patient was provided an opportunity to ask questions and all were answered. The patient agreed with the plan and demonstrated an understanding of the instructions.   The patient was advised to call back or seek an in-person evaluation if the symptoms worsen or if the condition fails to improve as anticipated.  I provided face-to-face time during this encounter. We were at different locations.   Sonda Primes, MD

## 2019-02-23 NOTE — Assessment & Plan Note (Signed)
Continue with core 

## 2019-02-23 NOTE — Assessment & Plan Note (Signed)
On zolpidem as needed  Potential benefits of a long term prn benzodiazepines  use as well as potential risks  and complications were explained to the patient and were aknowledged.

## 2019-02-24 ENCOUNTER — Other Ambulatory Visit: Payer: Self-pay | Admitting: Internal Medicine

## 2019-02-24 ENCOUNTER — Telehealth: Payer: Self-pay | Admitting: Internal Medicine

## 2019-02-24 ENCOUNTER — Other Ambulatory Visit: Payer: Self-pay | Admitting: *Deleted

## 2019-02-24 MED ORDER — ESCITALOPRAM OXALATE 10 MG PO TABS: 10.0000 mg | ORAL_TABLET | Freq: Every day | ORAL | 1 refills | Status: DC

## 2019-02-24 NOTE — Telephone Encounter (Signed)
escitalopram (LEXAPRO) 10 MG tablet  [id number] - [place], [place] - [address] Phone:  [phone]  Fax:  [phone]     zolpidem (AMBIEN) 10 MG tablet  [id number] - [place], [place] - [address] Phone:  [phone]  Fax:  [phone]     Pt very upset... meds sent to [facility] had requested [facility] as he was out of meds. Is calling again in the am to make sure is done.

## 2019-02-24 NOTE — Telephone Encounter (Signed)
Thie pt wife called in, had a virtual on Thursday, at dr request did test for Covid, positive... has developed a little cough since visit but maily just sinus congestion and headache, wants to have something for congestion called in, fu with pt at [phone]

## 2019-02-24 NOTE — Telephone Encounter (Signed)
Attempted to resend prescription for Lexapro electronically to the correct pharmacy, but prescription automatically changed to print. Please resend prescriptions for Lexapro and Ambien to [facility] as requested by the patient. Previously sent to [facility].

## 2019-02-24 NOTE — Telephone Encounter (Signed)
Pt's wife called to follow up on request for rx for pt's cough and congestion due to positive Covid test. Requesting follow up on if Dr. Posey Rea will or will not send in rx so they know what to do.

## 2019-02-24 NOTE — Telephone Encounter (Signed)
Please advise 

## 2019-02-25 ENCOUNTER — Other Ambulatory Visit: Payer: Self-pay | Admitting: Internal Medicine

## 2019-02-25 ENCOUNTER — Other Ambulatory Visit: Payer: Self-pay

## 2019-02-25 MED ORDER — ESCITALOPRAM OXALATE 10 MG PO TABS: 10.0000 mg | ORAL_TABLET | Freq: Every day | ORAL | 1 refills | Status: DC

## 2019-02-25 MED ORDER — BENZONATATE 200 MG PO CAPS: 200.0000 mg | ORAL_CAPSULE | Freq: Three times a day (TID) | ORAL | 0 refills | Status: DC | PRN

## 2019-02-25 MED ORDER — ESCITALOPRAM OXALATE 10 MG PO TABS: 10.0000 mg | ORAL_TABLET | Freq: Every day | ORAL | 3 refills | Status: DC

## 2019-02-25 NOTE — Telephone Encounter (Signed)
Wife notified RX being faxed

## 2019-02-25 NOTE — Telephone Encounter (Signed)
Pt's wife notified.

## 2019-02-25 NOTE — Telephone Encounter (Signed)
Pt's wife calling again.  Very upset.  States that she needs to speak with Kathie Rhodes immediately regarding pt's medication and the fact that it is not being sent in correctly.  Pt's wife states they have been trying to get this corrected since virtual visit last week.

## 2019-02-25 NOTE — Telephone Encounter (Signed)
Pt's wife called in regarding Lexapro and stated [facility] told her they do not have rx. She wants a call from someone at [facility] today regarding rx being refilled and why it's taking so long. She does not want to speak with anyone at [facility]. She wants to speak with Kathie Rhodes or a Charity fundraiser in office. Please advise.

## 2019-02-25 NOTE — Telephone Encounter (Signed)
Medication Refill - Medication: zolpidem (AMBIEN) 10 MG tablet   Pt's wife called to have this rerouted, pt no longer uses [facility]. Please use pharmacy below   Has the patient contacted their pharmacy? Yes.   (Agent: If no, request that the patient contact the pharmacy for the refill.) (Agent: If yes, when and what did the pharmacy advise?)  Preferred Pharmacy (with phone number or street name):  [facility] [address] [place] - [address]  [address] [place] [address]  Phone: [phone] Fax: [phone]     Agent: Please be advised that RX refills may take up to 3 business days. We ask that you follow-up with your pharmacy.

## 2019-02-25 NOTE — Telephone Encounter (Signed)
Wife called again very upset.Marland KitchenMarland KitchenShe ask that [facility] call her back asap.  CB#  [phone]

## 2019-02-25 NOTE — Telephone Encounter (Signed)
Ok Psychiatrist - Rx emailed to [facility] cold [facility] Thx

## 2019-02-27 ENCOUNTER — Telehealth: Payer: Self-pay | Admitting: Internal Medicine

## 2019-02-27 NOTE — Telephone Encounter (Signed)
Patient is requesting Ambien to be sent to [facility] instead of mail order.

## 2019-02-27 NOTE — Telephone Encounter (Signed)
RX request was sent to PCP already

## 2019-02-28 ENCOUNTER — Ambulatory Visit (INDEPENDENT_AMBULATORY_CARE_PROVIDER_SITE_OTHER): Payer: BC Managed Care – PPO | Admitting: Internal Medicine

## 2019-02-28 VITALS — BP 128/84 | HR 70 | Temp 98.5°F | Ht 65.0 in | Wt 170.0 lb

## 2019-02-28 DIAGNOSIS — U071 COVID-19: Secondary | ICD-10-CM | POA: Diagnosis not present

## 2019-02-28 DIAGNOSIS — R059 Cough, unspecified: Secondary | ICD-10-CM

## 2019-02-28 DIAGNOSIS — R05 Cough: Secondary | ICD-10-CM | POA: Diagnosis not present

## 2019-02-28 NOTE — Progress Notes (Signed)
Respiratory Clinic Note   Patient's initial symptoms began two days ago. Started feeling some tightness in his chest and cough developed. Wanted to have this checked out.  Covid testing was completed last week; results were positive. Current symptoms include occasionally productive cough, chest tightness, headaches, body aches.  Symptoms are stable. Symptomatic treatment includes: Tylenol, Tessalon, Mucinex   Presence of significant medical comorbidities including any history of MI, stroke, hypertension, chronic kidney disease, chronic liver disease, emphysema, asthma, sleep apnea, diabetes, history of cancer, dyslipidemia, obesity and smoking was evaluated. Comorbidities present include: None   Review of systems focused on signs and symptoms expected with Covid infection. Constitutional: Fever, fatigue, chills HEENT: Eye redness and discharge (conjunctivitis), nasal congestion, sore throat, anosmia, and altered taste Pulmonary: Cough nonproductive productive of sputum , dyspnea, tachycardia (pulse greater than 110), hemoptysis, chest pain, and tachypnea (respiratory rate greater than 22 GI: Anorexia, nausea, vomiting, diarrhea Genitourinary: Oliguria or anuria Skeletal: Myalgias Dermatologic: Rash Neurologic: Headache, dizziness, mental status changes Positive review of systems include: cough, chest tightness, body aches, headaches   Physical exam:  Vitals:   02/28/19 1813  BP: 128/84  Pulse: 70  Temp: 98.5 F (36.9 C)  SpO2: 96%   Pertinent or positive findings: General appearance: Adequately nourished; no acute distress, increased work of breathing is present.   Lymphatic: No lymphadenopathy about the head, neck, axilla. Eyes: No conjunctival inflammation or lid edema is present. There is no scleral icterus. Ears:  External ear exam shows no significant lesions or deformities.   Nose:  External nasal examination shows no deformity or inflammation. Nasal mucosa are pink and  moist without lesions, exudates Oral exam:  Lips and gums are healthy appearing. There is no oropharyngeal erythema or exudate. Neck:  No thyromegaly, masses, tenderness noted.    Heart:  Normal rate and regular rhythm. S1 and S2 normal without gallop, murmur, click, rub .  Lungs: Chest clear to auscultation without wheezes, rhonchi, rales, rubs. Normal respiratory effort. No tachypnea. No retractions. Occasional cough with deep inspiration.  Abdomen: Bowel sounds are normal. Abdomen is soft and nontender with no organomegaly, hernias, masses. GU: Deferred  Extremities:  No cyanosis, clubbing, edema  Neurologic exam : Normal tone. Normal gait.  Deep tendon reflexes are equal Skin: Warm & dry w/o tenting. No significant lesions or rash.  1. COVID-19 2. Cough  Patient with recent positive COVID-19 test. Reports onset of symptoms about two days ago. Patient well appearing with benign lung exam. Vital signs are stable. Continue supportive care treatments at home and discussed other measures to improve symptoms. Symptoms that would warrant ER follow up were discussed.     Marcy Siren, D.O. Primary Care at [facility]  02/28/2019, 7:01 PM

## 2019-03-19 ENCOUNTER — Telehealth: Payer: Self-pay

## 2019-03-19 IMAGING — CT CT RENAL STONE PROTOCOL
2 of 4 series · 15 of 46 positions shown, 17 images · non-contrast
Comparison: CT of the abdomen and pelvis from 01/26/2016

CLINICAL DATA: Acute onset of right lower back pain, radiating to
the groin.

EXAM:
CT ABDOMEN AND PELVIS WITHOUT CONTRAST
TECHNIQUE: Multidetector CT imaging of the abdomen and pelvis was performed
following the standard protocol without IV contrast.

[Series 3: ap without · axial · non-contrast · 0.63mm/px · z∈[-622,-192]mm · 12 of 96 slices shown, 14 images]
[im 5/96  soft-tissue]
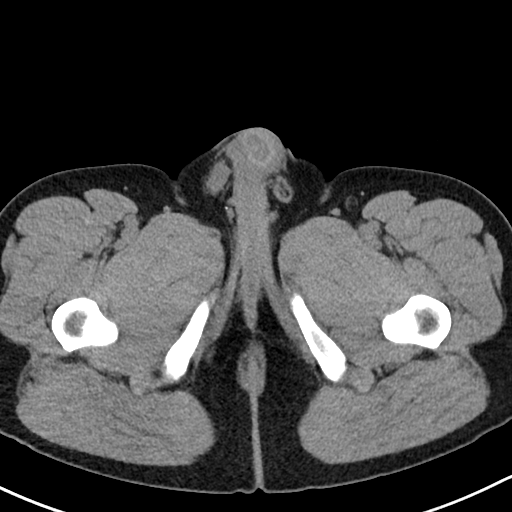
[im 5/96  bone]
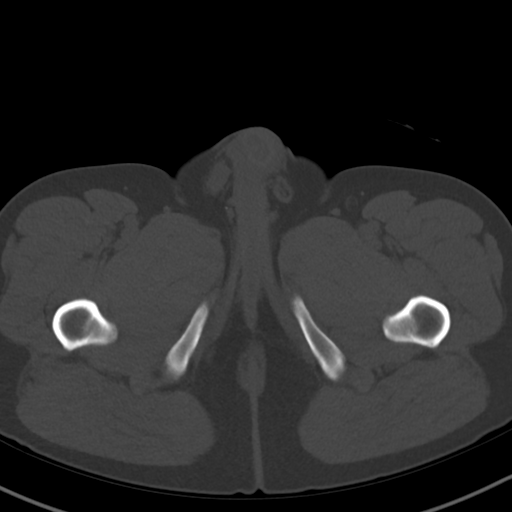
[im 14/96  soft-tissue]
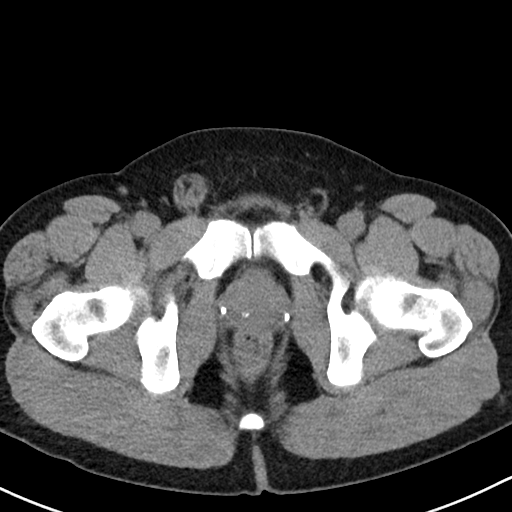
[im 23/96  soft-tissue]
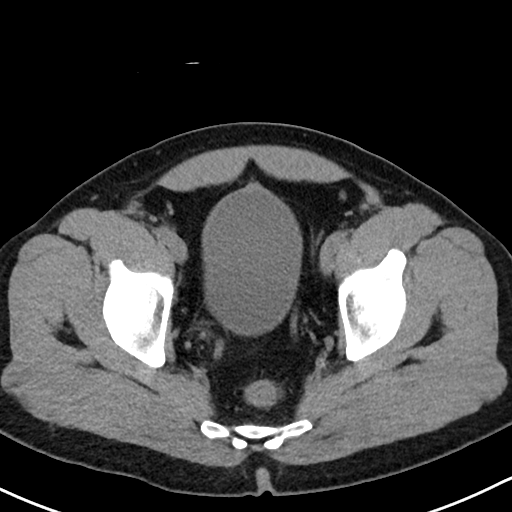
[im 28/96  soft-tissue]
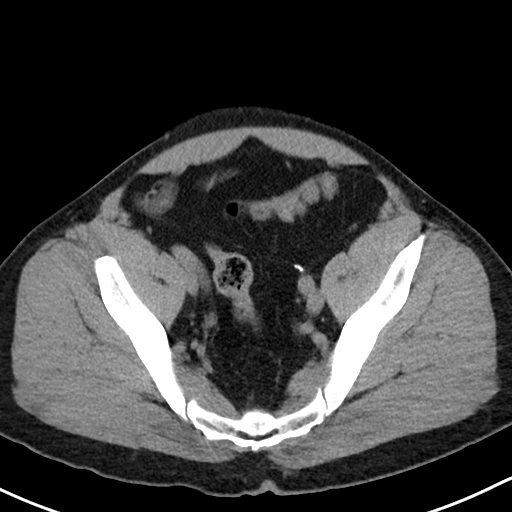
[im 37/96  soft-tissue]
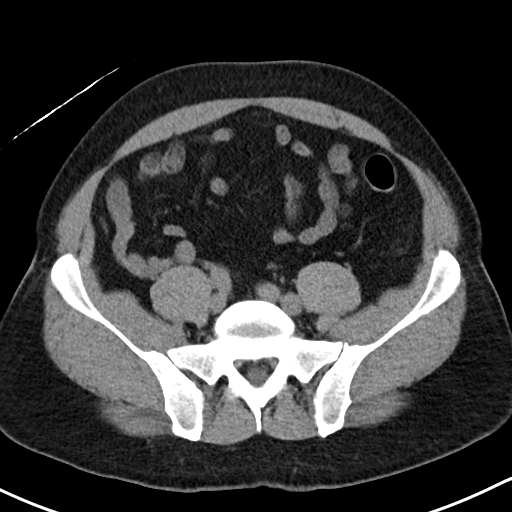
[im 46/96  soft-tissue]
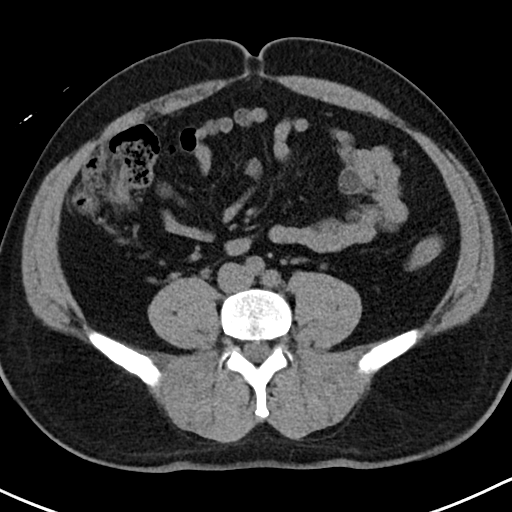
[im 50/96  soft-tissue]
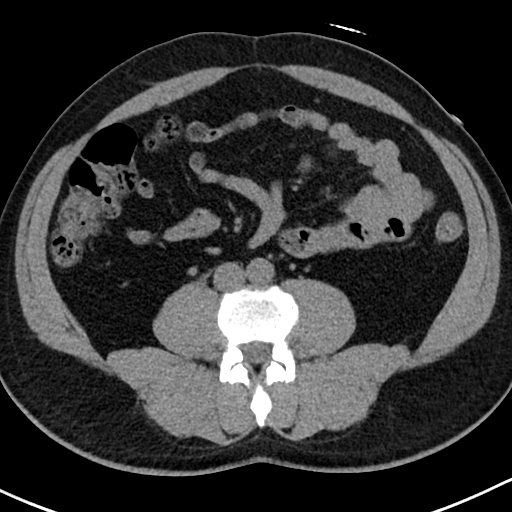
[im 59/96  soft-tissue]
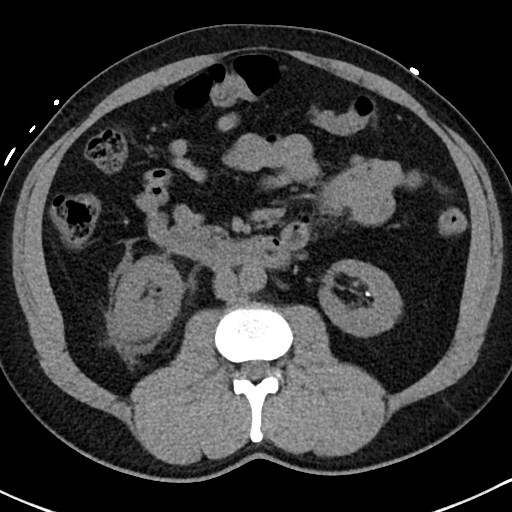
[im 68/96  soft-tissue]
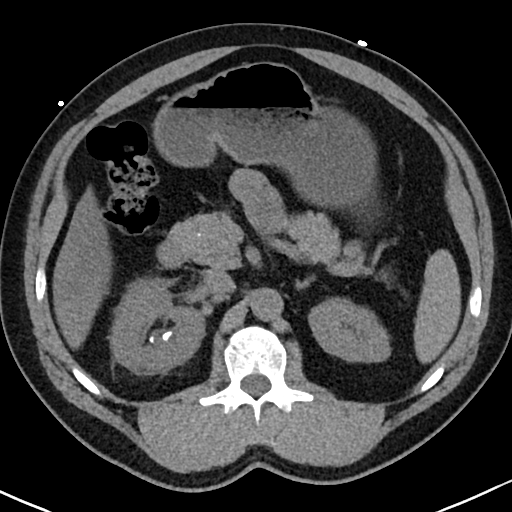
[im 68/96  bone]
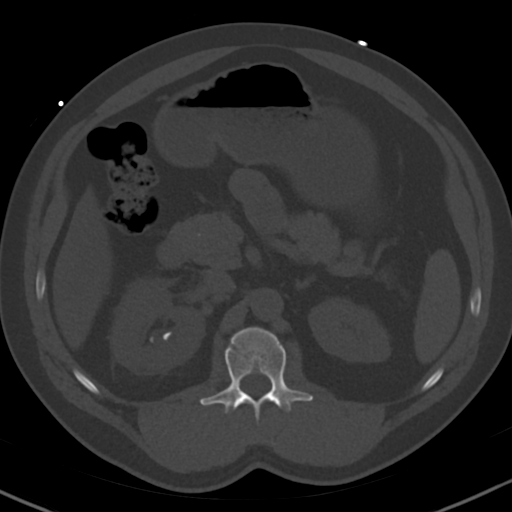
[im 73/96  soft-tissue]
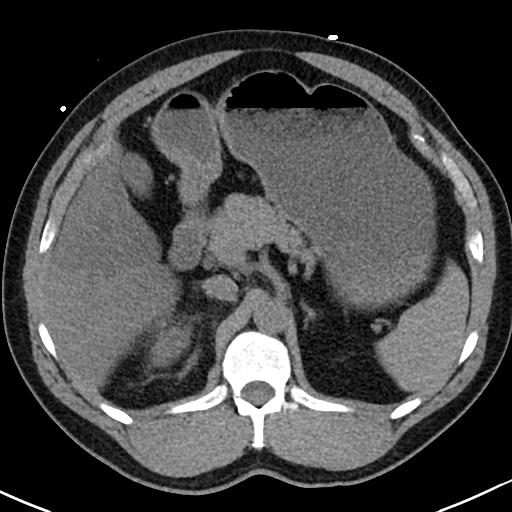
[im 82/96  soft-tissue]
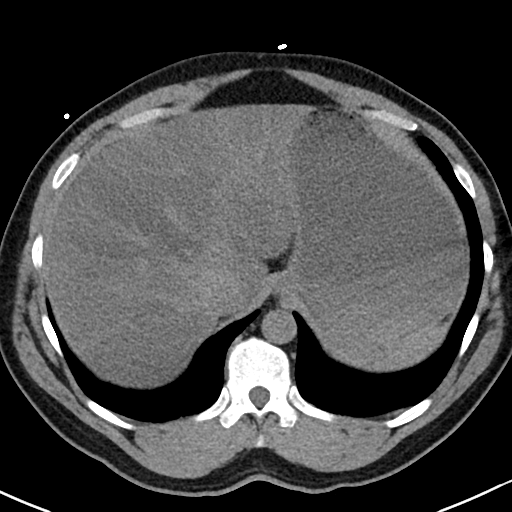
[im 91/96  soft-tissue]
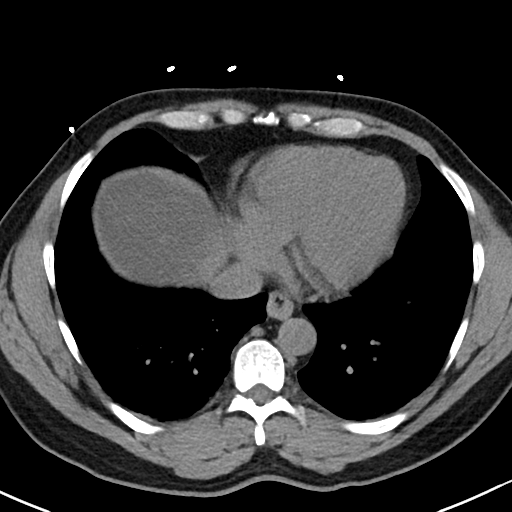

[Series 6: cor · coronal · 0.70mm/px · 3 of 93 slices shown]
[im 31/93  soft-tissue]
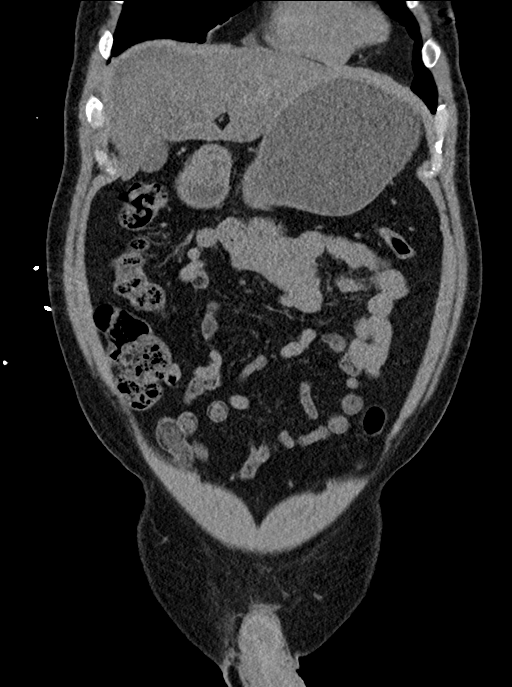
[im 41/93  soft-tissue]
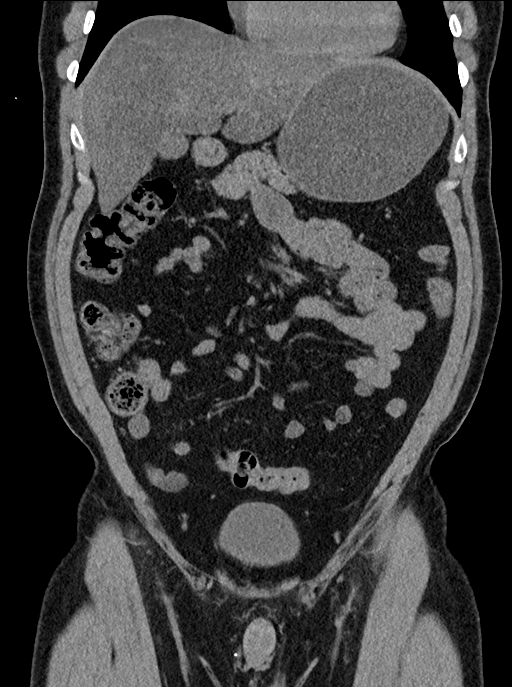
[im 52/93  soft-tissue]
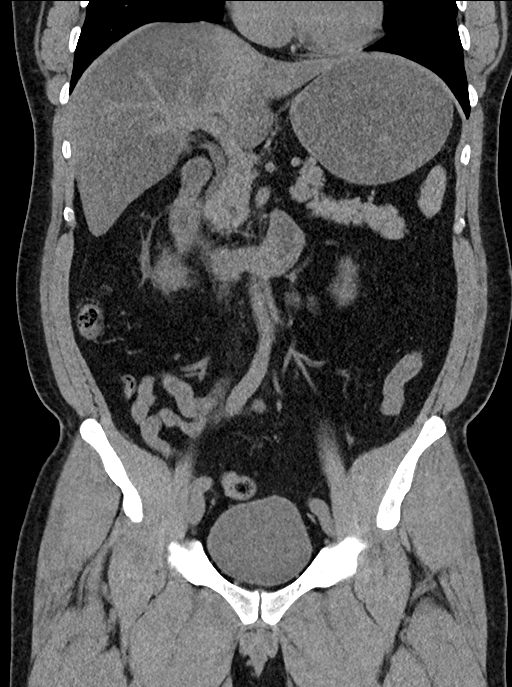

[15 of 46 positions shown; findings below may reference images not displayed]

FINDINGS: Lower chest: Minimal bibasilar atelectasis is noted. The visualized
portions of the mediastinum are unremarkable.

Hepatobiliary: There is diffuse fatty infiltration within the liver,
with areas of sparing. The gallbladder is unremarkable in
appearance. The common bile duct remains normal in caliber.

Pancreas: The pancreas is within normal limits.

Spleen: The spleen is unremarkable in appearance.

Adrenals/Urinary Tract: The adrenal glands are unremarkable in
appearance.

Minimal right-sided hydronephrosis is noted, with prominence of the
right ureter along its entire course. An obstructing 4 mm stone is
noted distally at the right vesicoureteral junction. Right-sided
perinephric stranding and fluid are noted.

Nonobstructing bilateral renal stones are seen, measuring up to 7 mm
in size.

Stomach/Bowel: The stomach is unremarkable in appearance. The small
bowel is within normal limits. The appendix is normal in caliber,
without evidence of appendicitis. The colon is unremarkable in
appearance.

Vascular/Lymphatic: The abdominal aorta is unremarkable in
appearance. The inferior vena cava is grossly unremarkable. No
retroperitoneal lymphadenopathy is seen. No pelvic sidewall
lymphadenopathy is identified.

Reproductive: The bladder is mildly distended and otherwise
unremarkable. The prostate is normal in size, with minimal
surrounding calcification.

Other: Superior retraction of the right testis may be transient in
nature.

Musculoskeletal: No acute osseous abnormalities are identified. The
visualized musculature is unremarkable in appearance.
IMPRESSION: 1. Minimal right-sided hydronephrosis, with an obstructing 4 mm
stone noted distally at the right vesicoureteral junction.
2. Nonobstructing bilateral renal stones measure up to 7 mm in size.
3. Diffuse fatty infiltration within the liver.
4. Superior retraction of the right testis may be transient in
nature.

## 2019-03-19 NOTE — Telephone Encounter (Signed)
New message      1. Which medications need to be refilled? (please list name of each medication and dose if known) zolpidem (AMBIEN) 10 MG tablet  2. Which pharmacy/location (including street and city if local pharmacy) is medication to be sent to? [facility] on [address]    3. Do they need a 30 day or 90 day supply? 30 day supply

## 2019-03-21 NOTE — Telephone Encounter (Signed)
It was refilled on 02/23/2019.  Thanks

## 2019-04-09 ENCOUNTER — Other Ambulatory Visit: Payer: Self-pay

## 2019-04-09 ENCOUNTER — Encounter: Payer: Self-pay | Admitting: Internal Medicine

## 2019-04-09 ENCOUNTER — Ambulatory Visit (INDEPENDENT_AMBULATORY_CARE_PROVIDER_SITE_OTHER): Payer: BC Managed Care – PPO | Admitting: Internal Medicine

## 2019-04-09 VITALS — BP 118/76 | HR 68 | Temp 98.6°F | Ht 65.0 in | Wt 180.0 lb

## 2019-04-09 DIAGNOSIS — N200 Calculus of kidney: Secondary | ICD-10-CM

## 2019-04-09 DIAGNOSIS — N401 Enlarged prostate with lower urinary tract symptoms: Secondary | ICD-10-CM

## 2019-04-09 DIAGNOSIS — Z0000 Encounter for general adult medical examination without abnormal findings: Secondary | ICD-10-CM | POA: Diagnosis not present

## 2019-04-09 DIAGNOSIS — N400 Enlarged prostate without lower urinary tract symptoms: Secondary | ICD-10-CM | POA: Insufficient documentation

## 2019-04-09 DIAGNOSIS — R7989 Other specified abnormal findings of blood chemistry: Secondary | ICD-10-CM

## 2019-04-09 DIAGNOSIS — N3943 Post-void dribbling: Secondary | ICD-10-CM | POA: Diagnosis not present

## 2019-04-09 LAB — URINALYSIS
Bilirubin Urine: NEGATIVE
Hgb urine dipstick: NEGATIVE
Ketones, ur: NEGATIVE
Leukocytes,Ua: NEGATIVE
Nitrite: NEGATIVE
Specific Gravity, Urine: 1.02 (ref 1.000–1.030)
Total Protein, Urine: NEGATIVE
Urine Glucose: NEGATIVE
Urobilinogen, UA: 0.2 (ref 0.0–1.0)
pH: 6 (ref 5.0–8.0)

## 2019-04-09 LAB — HEPATIC FUNCTION PANEL
ALT: 34 U/L (ref 0–53)
AST: 24 U/L (ref 0–37)
Albumin: 4.4 g/dL (ref 3.5–5.2)
Alkaline Phosphatase: 59 U/L (ref 39–117)
Bilirubin, Direct: 0.2 mg/dL (ref 0.0–0.3)
Total Bilirubin: 2 mg/dL — ABNORMAL HIGH (ref 0.2–1.2)
Total Protein: 6.6 g/dL (ref 6.0–8.3)

## 2019-04-09 LAB — CBC WITH DIFFERENTIAL/PLATELET
Basophils Absolute: 0.1 10*3/uL (ref 0.0–0.1)
Basophils Relative: 1.3 % (ref 0.0–3.0)
Eosinophils Absolute: 0.5 10*3/uL (ref 0.0–0.7)
Eosinophils Relative: 11.1 % — ABNORMAL HIGH (ref 0.0–5.0)
HCT: 43.9 % (ref 39.0–52.0)
Hemoglobin: 15 g/dL (ref 13.0–17.0)
Lymphocytes Relative: 30.6 % (ref 12.0–46.0)
Lymphs Abs: 1.4 10*3/uL (ref 0.7–4.0)
MCHC: 34.2 g/dL (ref 30.0–36.0)
MCV: 88 fl (ref 78.0–100.0)
Monocytes Absolute: 0.5 10*3/uL (ref 0.1–1.0)
Monocytes Relative: 10.5 % (ref 3.0–12.0)
Neutro Abs: 2.2 10*3/uL (ref 1.4–7.7)
Neutrophils Relative %: 46.5 % (ref 43.0–77.0)
Platelets: 207 10*3/uL (ref 150.0–400.0)
RBC: 4.99 Mil/uL (ref 4.22–5.81)
RDW: 13.4 % (ref 11.5–15.5)
WBC: 4.7 10*3/uL (ref 4.0–10.5)

## 2019-04-09 LAB — BASIC METABOLIC PANEL
BUN: 13 mg/dL (ref 6–23)
CO2: 30 mEq/L (ref 19–32)
Calcium: 9.6 mg/dL (ref 8.4–10.5)
Chloride: 106 mEq/L (ref 96–112)
Creatinine, Ser: 1.3 mg/dL (ref 0.40–1.50)
GFR: 57.19 mL/min — ABNORMAL LOW (ref >60.00)
Glucose, Bld: 101 mg/dL — ABNORMAL HIGH (ref 70–99)
Potassium: 4.6 mEq/L (ref 3.5–5.1)
Sodium: 141 mEq/L (ref 135–145)

## 2019-04-09 LAB — PSA: PSA: 0.7 ng/mL (ref 0.10–4.00)

## 2019-04-09 LAB — LIPID PANEL
Cholesterol: 207 mg/dL — ABNORMAL HIGH (ref 0–200)
HDL: 41.1 mg/dL (ref >39.00)
NonHDL: 165.56
Total CHOL/HDL Ratio: 5
Triglycerides: 207 mg/dL — ABNORMAL HIGH (ref 0.0–149.0)
VLDL: 41.4 mg/dL — ABNORMAL HIGH (ref 0.0–40.0)

## 2019-04-09 LAB — LDL CHOLESTEROL, DIRECT: Direct LDL: 136 mg/dL

## 2019-04-09 LAB — T4, FREE: Free T4: 0.61 ng/dL (ref 0.60–1.60)

## 2019-04-09 LAB — TSH: TSH: 7.47 u[IU]/mL — ABNORMAL HIGH (ref 0.35–4.50)

## 2019-04-09 MED ORDER — VITAMIN D3 50 MCG (2000 UT) PO CAPS: 2000.0000 [IU] | ORAL_CAPSULE | Freq: Every day | ORAL | 3 refills | Status: AC

## 2019-04-09 MED ORDER — TAMSULOSIN HCL 0.4 MG PO CAPS: 0.4000 mg | ORAL_CAPSULE | Freq: Every day | ORAL | 3 refills | Status: DC

## 2019-04-09 MED ORDER — ZOLPIDEM TARTRATE 10 MG PO TABS: 10.0000 mg | ORAL_TABLET | Freq: Every evening | ORAL | 1 refills | Status: DC | PRN

## 2019-04-09 NOTE — Assessment & Plan Note (Signed)
Worse - take Flomax daily

## 2019-04-09 NOTE — Progress Notes (Signed)
Subjective:  Patient ID: Kyle Griffin, male    DOB: 1965/08/14  Age: 54 y.o. MRN: [id number]  CC: No chief complaint on file.   HPI YUG LORIA presents for a well exam Had [id number] in Jan 2021 C/o dribbling x several months  Outpatient Medications Prior to Visit  Medication Sig Dispense Refill  . aspirin EC 81 MG tablet Take 81 mg by mouth daily.    . benzonatate (TESSALON) 200 MG capsule Take 1 capsule (200 mg total) by mouth 3 (three) times daily as needed for cough. 60 capsule 0  . cetirizine (ZYRTEC) 10 MG tablet Take 10 mg by mouth daily as needed. allergies    . escitalopram (LEXAPRO) 10 MG tablet Take 1 tablet (10 mg total) by mouth daily. 90 tablet 3  . famotidine (PEPCID) 20 MG tablet Take 1 tablet (20 mg total) by mouth daily. ulcers (Patient taking differently: Take 20 mg by mouth as needed for heartburn. ulcers) 30 tablet 2  . HYDROmorphone (DILAUDID) 2 MG tablet Take 1 tablet (2 mg total) by mouth every 4 (four) hours as needed for moderate pain or severe pain. 15 tablet 0  . rosuvastatin (CRESTOR) 5 MG tablet Take 1 tablet (5 mg total) by mouth daily. 90 tablet 3  . tamsulosin (FLOMAX) 0.4 MG CAPS capsule Take 1 capsule (0.4 mg total) by mouth daily. 30 capsule 0  . zolpidem (AMBIEN) 10 MG tablet Take 1 tablet (10 mg total) by mouth at bedtime as needed for sleep. 90 tablet 1   No facility-administered medications prior to visit.    ROS: Review of Systems  Constitutional: Negative for appetite change, fatigue and unexpected weight change.  HENT: Negative for congestion, nosebleeds, sneezing, sore throat and trouble swallowing.   Eyes: Negative for itching and visual disturbance.  Respiratory: Negative for cough.   Cardiovascular: Negative for chest pain, palpitations and leg swelling.  Gastrointestinal: Negative for abdominal distention, blood in stool, diarrhea and nausea.  Genitourinary: Negative for frequency and hematuria.  Musculoskeletal:  Negative for back pain, gait problem, joint swelling and neck pain.  Skin: Negative for rash.  Neurological: Negative for dizziness, tremors, speech difficulty and weakness.  Psychiatric/Behavioral: Positive for sleep disturbance. Negative for agitation and dysphoric mood. The patient is not nervous/anxious.     Objective:  There were no vitals taken for this visit.  BP Readings from Last 3 Encounters:  02/28/19 128/84  05/23/18 122/81  12/20/17 128/82    Wt Readings from Last 3 Encounters:  02/28/19 170 lb (77.1 kg)  05/23/18 165 lb (74.8 kg)  12/20/17 171 lb (77.6 kg)    Physical Exam Constitutional:      General: He is not in acute distress.    Appearance: He is well-developed.     Comments: NAD  Eyes:     Conjunctiva/sclera: Conjunctivae normal.     Pupils: Pupils are equal, round, and reactive to light.  Neck:     Thyroid: No thyromegaly.     Vascular: No JVD.  Cardiovascular:     Rate and Rhythm: Normal rate and regular rhythm.     Heart sounds: Normal heart sounds. No murmur. No friction rub. No gallop.   Pulmonary:     Effort: Pulmonary effort is normal. No respiratory distress.     Breath sounds: Normal breath sounds. No wheezing or rales.  Chest:     Chest wall: No tenderness.  Abdominal:     General: Bowel sounds are normal. There is no distension.  Palpations: Abdomen is soft. There is no mass.     Tenderness: There is no abdominal tenderness. There is no guarding or rebound.  Genitourinary:    Rectum: Normal. Guaiac result negative.  Musculoskeletal:        General: No tenderness. Normal range of motion.     Cervical back: Normal range of motion.  Lymphadenopathy:     Cervical: No cervical adenopathy.  Skin:    General: Skin is warm and dry.     Findings: No rash.  Neurological:     Mental Status: He is alert and oriented to person, place, and time.     Cranial Nerves: No cranial nerve deficit.     Motor: No abnormal muscle tone.      Coordination: Coordination normal.     Gait: Gait normal.     Deep Tendon Reflexes: Reflexes are normal and symmetric.  Psychiatric:        Behavior: Behavior normal.        Thought Content: Thought content normal.        Judgment: Judgment normal.   prostate 1+, NT   Lab Results  Component Value Date   WBC 5.4 12/25/2017   HGB 13.9 12/25/2017   HCT 40.2 12/25/2017   PLT 193.0 12/25/2017   GLUCOSE 95 12/25/2017   CHOL 213 (H) 12/25/2017   TRIG 263.0 (H) 12/25/2017   HDL 41.30 12/25/2017   LDLDIRECT 137.0 12/25/2017   ALT 35 12/25/2017   AST 24 12/25/2017   NA 138 12/25/2017   K 3.9 12/25/2017   CL 102 12/25/2017   CREATININE 1.16 12/25/2017   BUN 10 12/25/2017   CO2 27 12/25/2017   TSH 5.17 (H) 12/25/2017   PSA 0.82 12/25/2017   HGBA1C 5.5 10/12/2010    DG Abd 1 View  Result Date: 05/23/2018 CLINICAL DATA:  54 year old male with right-sided nephrolithiasis and ureteral stone. EXAM: ABDOMEN - 1 VIEW COMPARISON:  CT scan 05/20/2018 FINDINGS: Gas within the colon completely obscures the right renal shadow and proximal and mid ureter. The right renal and proximal ureteral stone are not confidently identified on today's examination and were much better seen on the recent CT scan. However, the left renal shadow is unobscured and at least 4 punctate stones are identified scattered in the interpolar and lower pole collecting system. Vascular phleboliths project over the anatomic pelvis. The bowel gas pattern is not obstructed. Osseous structures are intact and unremarkable. IMPRESSION: 1. Gas in the colon completely obscures the right renal shadow and proximal right ureter. The known right renal and proximal ureteral stones are not definitively identified. There is a faint linear radiopacity overlying the expected location of the right lower pole likely representing the renal stone. 2. At least 4 punctate stones are visible overlying the left interpolar and lower pole collecting system.  Electronically Signed   By: Malachy Moan M.D.   On: 05/23/2018 07:39    Assessment & Plan:   There are no diagnoses linked to this encounter.   No orders of the defined types were placed in this encounter.    Follow-up: No follow-ups on file.  Sonda Primes, MD

## 2019-04-09 NOTE — Assessment & Plan Note (Signed)
F/u w/Dr Wrenn  

## 2019-04-10 ENCOUNTER — Other Ambulatory Visit: Payer: Self-pay

## 2019-04-10 DIAGNOSIS — R52 Pain, unspecified: Secondary | ICD-10-CM | POA: Diagnosis not present

## 2019-04-10 DIAGNOSIS — M25521 Pain in right elbow: Secondary | ICD-10-CM | POA: Insufficient documentation

## 2019-04-10 DIAGNOSIS — R7989 Other specified abnormal findings of blood chemistry: Secondary | ICD-10-CM

## 2019-04-14 ENCOUNTER — Other Ambulatory Visit: Payer: Self-pay

## 2019-04-14 ENCOUNTER — Other Ambulatory Visit (INDEPENDENT_AMBULATORY_CARE_PROVIDER_SITE_OTHER): Payer: BC Managed Care – PPO

## 2019-04-14 DIAGNOSIS — R7989 Other specified abnormal findings of blood chemistry: Secondary | ICD-10-CM

## 2019-04-14 LAB — T4, FREE: Free T4: 0.71 ng/dL (ref 0.60–1.60)

## 2019-05-16 ENCOUNTER — Ambulatory Visit: Payer: BC Managed Care – PPO | Attending: Internal Medicine

## 2019-05-16 DIAGNOSIS — Z23 Encounter for immunization: Secondary | ICD-10-CM

## 2019-05-16 NOTE — Progress Notes (Signed)
   Covid-19 Vaccination Clinic  Name:  Kyle Griffin    MRN: [id number] DOB: 09/23/1965  05/16/2019  Mr. Masterson was observed post Covid-19 immunization for 15 minutes without incident. He was provided with Vaccine Information Sheet and instruction to access the V-Safe system.   Mr. Chavarin was instructed to call 911 with any severe reactions post vaccine: Marland Kitchen Difficulty breathing  . Swelling of face and throat  . A fast heartbeat  . A bad rash all over body  . Dizziness and weakness   Immunizations Administered    Name Date Dose VIS Date Route   Pfizer COVID-19 Vaccine 05/16/2019  2:22 PM 0.3 mL 01/31/2019 Intramuscular   Manufacturer: [employer], [employer]   Lot: [id number]   NDC: [address]-6

## 2019-05-25 ENCOUNTER — Other Ambulatory Visit: Payer: Self-pay | Admitting: Internal Medicine

## 2019-06-11 ENCOUNTER — Ambulatory Visit: Payer: BC Managed Care – PPO

## 2019-06-23 DIAGNOSIS — N202 Calculus of kidney with calculus of ureter: Secondary | ICD-10-CM | POA: Diagnosis not present

## 2019-06-23 DIAGNOSIS — R310 Gross hematuria: Secondary | ICD-10-CM | POA: Diagnosis not present

## 2019-06-30 ENCOUNTER — Ambulatory Visit: Payer: BC Managed Care – PPO | Admitting: Family

## 2019-07-02 DIAGNOSIS — T1490XA Injury, unspecified, initial encounter: Secondary | ICD-10-CM | POA: Insufficient documentation

## 2019-09-24 DIAGNOSIS — J019 Acute sinusitis, unspecified: Secondary | ICD-10-CM | POA: Diagnosis not present

## 2019-11-24 DIAGNOSIS — S40261A Insect bite (nonvenomous) of right shoulder, initial encounter: Secondary | ICD-10-CM | POA: Diagnosis not present

## 2019-11-24 DIAGNOSIS — L821 Other seborrheic keratosis: Secondary | ICD-10-CM | POA: Diagnosis not present

## 2020-01-23 DIAGNOSIS — Z20822 Contact with and (suspected) exposure to covid-19: Secondary | ICD-10-CM | POA: Diagnosis not present

## 2020-01-23 DIAGNOSIS — R0981 Nasal congestion: Secondary | ICD-10-CM | POA: Diagnosis not present

## 2020-01-23 DIAGNOSIS — J069 Acute upper respiratory infection, unspecified: Secondary | ICD-10-CM | POA: Diagnosis not present

## 2020-01-23 DIAGNOSIS — R059 Cough, unspecified: Secondary | ICD-10-CM | POA: Diagnosis not present

## 2020-01-26 ENCOUNTER — Telehealth (INDEPENDENT_AMBULATORY_CARE_PROVIDER_SITE_OTHER): Payer: BC Managed Care – PPO | Admitting: Family

## 2020-01-26 ENCOUNTER — Other Ambulatory Visit: Payer: Self-pay | Admitting: Family

## 2020-01-26 ENCOUNTER — Other Ambulatory Visit: Payer: Self-pay

## 2020-01-26 DIAGNOSIS — J069 Acute upper respiratory infection, unspecified: Secondary | ICD-10-CM | POA: Diagnosis not present

## 2020-01-26 DIAGNOSIS — J209 Acute bronchitis, unspecified: Secondary | ICD-10-CM

## 2020-01-26 MED ORDER — DOXYCYCLINE HYCLATE 100 MG PO TABS: 100.0000 mg | ORAL_TABLET | Freq: Two times a day (BID) | ORAL | 0 refills | Status: DC

## 2020-01-26 MED ORDER — PREDNISONE 20 MG PO TABS: 20.0000 mg | ORAL_TABLET | Freq: Every day | ORAL | 0 refills | Status: DC

## 2020-01-26 MED ORDER — BENZONATATE 200 MG PO CAPS: 200.0000 mg | ORAL_CAPSULE | Freq: Three times a day (TID) | ORAL | 0 refills | Status: DC | PRN

## 2020-01-26 NOTE — Progress Notes (Signed)
Pt scheduled for covid test today at 1330

## 2020-01-26 NOTE — Progress Notes (Signed)
Kyle Griffin is a 54 y.o. male with the following history as recorded in Epic[employer]:  Patient Active Problem List   Diagnosis Date Noted  . BPH (benign prostatic hyperplasia) 04/09/2019  . Cough 05/18/2016  . Right otitis media 12/11/2015  . 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 Tongue coating 10/26/2015  . Vertigo 05/31/2015  . Recurrent sinusitis 04/25/2013  . Abdominal pain, LLQ 01/20/2013  . Well adult exam 09/16/2012  . Insomnia 11/14/2011  . 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 Nephrolithiasis 05/12/2011  . Diarrhea 02/06/2011  . 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 Hepatic steatosis 05/26/2010  . Hyperglycemia 05/26/2010  . Abdominal pain, epigastric 04/11/2010  . LIVER FUNCTION TESTS, ABNORMAL, HX OF 04/11/2010  . Slowing of urinary stream 01/08/2009  . TOBACCO USE, QUIT 01/08/2009  . CERVICAL RADICULOPATHY 05/08/2008  . Chest pain 05/08/2008  . Acute sinus infection 03/12/2008  . ASTHMA 03/12/2008  . ASTHMATIC BRONCHITIS, ACUTE 07/25/2007  . ALLERGIC RHINITIS 07/25/2007  . GERD 07/25/2007  . IBS 07/25/2007  . Dyslipidemia 05/03/2007  . HEMORRHOIDS, NOS 05/03/2007  . ABNORMAL RESULT, FUNCTION STUDY, LIVER 10/01/2006    Current Outpatient Medications  Medication Sig Dispense Refill  . aspirin EC 81 MG tablet Take 81 mg by mouth daily.    . cetirizine (ZYRTEC) 10 MG tablet Take 10 mg by mouth daily as needed. allergies    . Cholecalciferol (VITAMIN D3) 50 MCG (2000 UT) capsule Take 1 capsule (2,000 Units total) by mouth daily. 100 capsule 3  . escitalopram (LEXAPRO) 10 MG tablet Take 1 tablet (10 mg total) by mouth daily. 90 tablet 3  . famotidine (PEPCID) 20 MG tablet Take 1 tablet (20 mg total) by mouth daily. ulcers (Patient taking differently: Take 20 mg by mouth as needed for heartburn. ulcers) 30 tablet 2  . rosuvastatin (CRESTOR) 5 MG tablet TAKE ONE TABLET BY MOUTH ONE TIME DAILY  90 tablet 3  . tamsulosin (FLOMAX) 0.4 MG CAPS capsule  Take 1 capsule (0.4 mg total) by mouth daily. 90 capsule 3  . zolpidem (AMBIEN) 10 MG tablet Take 1 tablet (10 mg total) by mouth at bedtime as needed for sleep. 90 tablet 1  . benzonatate (TESSALON) 200 MG capsule Take 1 capsule (200 mg total) by mouth 3 (three) times daily as needed. 30 capsule 0  . doxycycline (VIBRA-TABS) 100 MG tablet Take 1 tablet (100 mg total) by mouth 2 (two) times daily. 20 tablet 0  . predniSONE (DELTASONE) 20 MG tablet Take 1 tablet (20 mg total) by mouth daily with breakfast. 5 tablet 0   No current facility-administered medications for this visit.    Allergies: Oxycodone-acetaminophen  Past Medical History:  Diagnosis Date  . Allergic rhinitis   . Asthma, exercise induced    last exaberation was 10 years ago  . GERD (gastroesophageal reflux disease)   . Gilbert disease   . History of kidney stones   . Hyperlipidemia   . IBS (irritable bowel syndrome)   . Nephrolithiasis   . Renal stones     Past Surgical History:  Procedure Laterality Date  . EXTRACORPOREAL SHOCK WAVE LITHOTRIPSY Right 08/09/2017   Procedure: RIGHT EXTRACORPOREAL SHOCK WAVE LITHOTRIPSY (ESWL);  Surgeon: Jerilee Field, MD;  Location: [facility];  Service: Urology;  Laterality: Right;  . EXTRACORPOREAL SHOCK WAVE LITHOTRIPSY Left 12/06/2017   Procedure: EXTRACORPOREAL SHOCK WAVE LITHOTRIPSY (ESWL);  Surgeon: Malen Gauze, MD;  Location: [facility];  Service: Urology;  Laterality: Left;  . EXTRACORPOREAL SHOCK WAVE  LITHOTRIPSY Right 05/23/2018   Procedure: EXTRACORPOREAL SHOCK WAVE LITHOTRIPSY (ESWL);  Surgeon: Bjorn Pippin, MD;  Location: [facility];  Service: Urology;  Laterality: Right;  . FOOT SURGERY     Cyst, Heel  . ROTATOR CUFF REPAIR      Family History  Problem Relation Age of Onset  . Coronary artery disease Mother   . Stroke Mother   . Heart disease Mother   . Hyperlipidemia Mother   . Cancer Mother 79       lung ca  . Heart disease Father   . Hyperlipidemia Father   .  Diabetes Neg Hx     Social History   Tobacco Use  . Smoking status: Former Smoker    Quit date: 12/23/1999    Years since quitting: 20.1  . Smokeless tobacco: Former Engineer, water Use Topics  . Alcohol use: No    Subjective:    I connected with Kyle Griffin on 01/26/20 at 11:00 AM EST by a telephone call and verified that I am speaking with the correct person using two identifiers.   I discussed the limitations of evaluation and management by telemedicine and the availability of in person appointments. The patient expressed understanding and agreed to proceed. Provider in office/ patient is at home; provider and patient are only 2 people on telephone call.   Cough/ congestion x 5-6 days; + headache; + productive cough; + fever; loss of sense of taste or smell; is vaccinated against COVID;    Objective:  There were no vitals filed for this visit.  Lungs: Respirations unlabored;  Neurologic: Alert and oriented; speech intact; face symmetrical;   Assessment:  1. Acute bronchitis, unspecified organism     Plan:  Will get patient tested for COVID; will go ahead and treat for bacterial infection- Rx for Doxycycline 100 mg bid x 10 days, prednisone 20 mg qd x 5 days and [employer]; work note given as requested; follow-up to be determined;   Time spent 11 minutes  No follow-ups on file.  No orders of the defined types were placed in this encounter.   Requested Prescriptions    No prescriptions requested or ordered in this encounter

## 2020-01-28 LAB — SARS-COV-2, NAA 2 DAY TAT

## 2020-01-28 LAB — NOVEL CORONAVIRUS, NAA: SARS-CoV-2, NAA: NOT DETECTED

## 2020-01-28 LAB — SPECIMEN STATUS REPORT

## 2020-02-08 ENCOUNTER — Other Ambulatory Visit: Payer: Self-pay | Admitting: Internal Medicine

## 2020-03-10 ENCOUNTER — Telehealth: Payer: BC Managed Care – PPO | Admitting: Family

## 2020-03-10 DIAGNOSIS — J069 Acute upper respiratory infection, unspecified: Secondary | ICD-10-CM

## 2020-03-10 MED ORDER — FLUTICASONE PROPIONATE 50 MCG/ACT NA SUSP: 2.0000 | Freq: Every day | NASAL | 6 refills | Status: AC

## 2020-03-10 MED ORDER — BENZONATATE 100 MG PO CAPS: 100.0000 mg | ORAL_CAPSULE | Freq: Three times a day (TID) | ORAL | 0 refills | Status: DC | PRN

## 2020-03-10 NOTE — Progress Notes (Signed)

## 2020-04-13 ENCOUNTER — Ambulatory Visit (INDEPENDENT_AMBULATORY_CARE_PROVIDER_SITE_OTHER): Payer: BC Managed Care – PPO | Admitting: Internal Medicine

## 2020-04-13 ENCOUNTER — Other Ambulatory Visit: Payer: Self-pay

## 2020-04-13 ENCOUNTER — Encounter: Payer: Self-pay | Admitting: Internal Medicine

## 2020-04-13 VITALS — BP 120/82 | HR 67 | Temp 98.1°F | Ht 65.0 in | Wt 183.2 lb

## 2020-04-13 DIAGNOSIS — G4700 Insomnia, unspecified: Secondary | ICD-10-CM | POA: Diagnosis not present

## 2020-04-13 DIAGNOSIS — N401 Enlarged prostate with lower urinary tract symptoms: Secondary | ICD-10-CM | POA: Diagnosis not present

## 2020-04-13 DIAGNOSIS — Z0000 Encounter for general adult medical examination without abnormal findings: Secondary | ICD-10-CM | POA: Diagnosis not present

## 2020-04-13 DIAGNOSIS — N3943 Post-void dribbling: Secondary | ICD-10-CM

## 2020-04-13 DIAGNOSIS — E785 Hyperlipidemia, unspecified: Secondary | ICD-10-CM

## 2020-04-13 DIAGNOSIS — J309 Allergic rhinitis, unspecified: Secondary | ICD-10-CM | POA: Diagnosis not present

## 2020-04-13 DIAGNOSIS — M19049 Primary osteoarthritis, unspecified hand: Secondary | ICD-10-CM

## 2020-04-13 LAB — CBC WITH DIFFERENTIAL/PLATELET
Basophils Absolute: 0.1 10*3/uL (ref 0.0–0.1)
Basophils Relative: 1.3 % (ref 0.0–3.0)
Eosinophils Absolute: 0.7 10*3/uL (ref 0.0–0.7)
Eosinophils Relative: 15.3 % — ABNORMAL HIGH (ref 0.0–5.0)
HCT: 43.4 % (ref 39.0–52.0)
Hemoglobin: 15.1 g/dL (ref 13.0–17.0)
Lymphocytes Relative: 34.6 % (ref 12.0–46.0)
Lymphs Abs: 1.5 10*3/uL (ref 0.7–4.0)
MCHC: 34.6 g/dL (ref 30.0–36.0)
MCV: 86.1 fl (ref 78.0–100.0)
Monocytes Absolute: 0.5 10*3/uL (ref 0.1–1.0)
Monocytes Relative: 12 % (ref 3.0–12.0)
Neutro Abs: 1.6 10*3/uL (ref 1.4–7.7)
Neutrophils Relative %: 36.8 % — ABNORMAL LOW (ref 43.0–77.0)
Platelets: 182 10*3/uL (ref 150.0–400.0)
RBC: 5.05 Mil/uL (ref 4.22–5.81)
RDW: 13.6 % (ref 11.5–15.5)
WBC: 4.4 10*3/uL (ref 4.0–10.5)

## 2020-04-13 LAB — URINALYSIS
Bilirubin Urine: NEGATIVE
Hgb urine dipstick: NEGATIVE
Ketones, ur: NEGATIVE
Leukocytes,Ua: NEGATIVE
Nitrite: NEGATIVE
Specific Gravity, Urine: 1.025 (ref 1.000–1.030)
Total Protein, Urine: NEGATIVE
Urine Glucose: NEGATIVE
Urobilinogen, UA: 0.2 (ref 0.0–1.0)
pH: 5.5 (ref 5.0–8.0)

## 2020-04-13 LAB — LIPID PANEL
Cholesterol: 196 mg/dL (ref 0–200)
HDL: 40.9 mg/dL (ref >39.00)
LDL Cholesterol: 118 mg/dL — ABNORMAL HIGH (ref 0–99)
NonHDL: 155.42
Total CHOL/HDL Ratio: 5
Triglycerides: 189 mg/dL — ABNORMAL HIGH (ref 0.0–149.0)
VLDL: 37.8 mg/dL (ref 0.0–40.0)

## 2020-04-13 LAB — COMPREHENSIVE METABOLIC PANEL
ALT: 25 U/L (ref 0–53)
AST: 20 U/L (ref 0–37)
Albumin: 4.2 g/dL (ref 3.5–5.2)
Alkaline Phosphatase: 58 U/L (ref 39–117)
BUN: 16 mg/dL (ref 6–23)
CO2: 28 mEq/L (ref 19–32)
Calcium: 9.1 mg/dL (ref 8.4–10.5)
Chloride: 105 mEq/L (ref 96–112)
Creatinine, Ser: 1.34 mg/dL (ref 0.40–1.50)
GFR: 59.19 mL/min — ABNORMAL LOW (ref >60.00)
Glucose, Bld: 102 mg/dL — ABNORMAL HIGH (ref 70–99)
Potassium: 4.5 mEq/L (ref 3.5–5.1)
Sodium: 139 mEq/L (ref 135–145)
Total Bilirubin: 3.5 mg/dL — ABNORMAL HIGH (ref 0.2–1.2)
Total Protein: 6.5 g/dL (ref 6.0–8.3)

## 2020-04-13 LAB — T4, FREE: Free T4: 0.73 ng/dL (ref 0.60–1.60)

## 2020-04-13 LAB — PSA: PSA: 0.52 ng/mL (ref 0.10–4.00)

## 2020-04-13 LAB — TSH: TSH: 5.44 u[IU]/mL — ABNORMAL HIGH (ref 0.35–4.50)

## 2020-04-13 MED ORDER — FINASTERIDE 5 MG PO TABS: 5.0000 mg | ORAL_TABLET | Freq: Every day | ORAL | 3 refills | Status: AC

## 2020-04-13 MED ORDER — LORATADINE 10 MG PO TABS: 10.0000 mg | ORAL_TABLET | Freq: Every day | ORAL | 3 refills | Status: AC

## 2020-04-13 MED ORDER — ZOLPIDEM TARTRATE 10 MG PO TABS: 10.0000 mg | ORAL_TABLET | Freq: Every evening | ORAL | 1 refills | Status: DC | PRN

## 2020-04-13 MED ORDER — POTASSIUM CITRATE ER 15 MEQ (1620 MG) PO TBCR: EXTENDED_RELEASE_TABLET | ORAL | 3 refills | Status: DC

## 2020-04-13 NOTE — Assessment & Plan Note (Signed)
OA B  - OTC NSAIDs prn

## 2020-04-13 NOTE — Assessment & Plan Note (Signed)
Zlpidem prn

## 2020-04-13 NOTE — Assessment & Plan Note (Addendum)
Worsened sx's Take Flomax daily Added Proscar See Dr Ronne Binning if ready

## 2020-04-13 NOTE — Assessment & Plan Note (Signed)
Claritin daily

## 2020-04-13 NOTE — Progress Notes (Signed)
Subjective:  Patient ID: Kyle Griffin, male    DOB: [birth year]  Age: 55 y.o. MRN: [id number]  CC: Annual Exam   HPI Kyle Griffin presents for a well exam C/o dribbling x 6 mo C/o fingers OA - stiff in am  Outpatient Medications Prior to Visit  Medication Sig Dispense Refill  . aspirin EC 81 MG tablet Take 81 mg by mouth daily.    . benzonatate (TESSALON PERLES) 100 MG capsule Take 1 capsule (100 mg total) by mouth 3 (three) times daily as needed. 20 capsule 0  . cetirizine (ZYRTEC) 10 MG tablet Take 10 mg by mouth daily as needed. allergies    . Cholecalciferol (VITAMIN D3) 50 MCG (2000 UT) capsule Take 1 capsule (2,000 Units total) by mouth daily. 100 capsule 3  . escitalopram (LEXAPRO) 10 MG tablet TAKE ONE TABLET BY MOUTH ONE TIME DAILY 90 tablet 1  . famotidine (PEPCID) 20 MG tablet Take 1 tablet (20 mg total) by mouth daily. ulcers (Patient taking differently: Take 20 mg by mouth as needed for heartburn. ulcers) 30 tablet 2  . fluticasone (FLONASE) 50 MCG/ACT nasal spray Place 2 sprays into both nostrils daily. 16 g 6  . Potassium Citrate 15 MEQ (1620 MG) TBCR Take 1 by mouth twice a day    . predniSONE (DELTASONE) 20 MG tablet Take 1 tablet (20 mg total) by mouth daily with breakfast. 5 tablet 0  . rosuvastatin (CRESTOR) 5 MG tablet TAKE ONE TABLET BY MOUTH ONE TIME DAILY  90 tablet 3  . tamsulosin (FLOMAX) 0.4 MG CAPS capsule Take 1 capsule (0.4 mg total) by mouth daily. 90 capsule 3  . zolpidem (AMBIEN) 10 MG tablet Take 1 tablet (10 mg total) by mouth at bedtime as needed for sleep. 90 tablet 1  . doxycycline (VIBRA-TABS) 100 MG tablet Take 1 tablet (100 mg total) by mouth 2 (two) times daily. 20 tablet 0   No facility-administered medications prior to visit.    ROS: Review of Systems  Constitutional: Negative for appetite change, fatigue and unexpected weight change.  HENT: Negative for congestion, nosebleeds, sneezing, sore throat and trouble swallowing.    Eyes: Negative for itching and visual disturbance.  Respiratory: Negative for cough.   Cardiovascular: Negative for chest pain, palpitations and leg swelling.  Gastrointestinal: Negative for abdominal distention, blood in stool, diarrhea and nausea.  Genitourinary: Positive for frequency and urgency. Negative for decreased urine volume and hematuria.  Musculoskeletal: Positive for arthralgias. Negative for back pain, gait problem, joint swelling and neck pain.  Skin: Negative for rash.  Neurological: Negative for dizziness, tremors, speech difficulty and weakness.  Psychiatric/Behavioral: Negative for agitation, dysphoric mood and sleep disturbance. The patient is not nervous/anxious.     Objective:  BP 120/82 (BP Location: Left Arm)   Pulse 67   Temp 98.1 F (36.7 C) (Oral)   Ht 5\' 5"  (1.651 m)   Wt 183 lb 3.2 oz (83.1 kg)   SpO2 96%   BMI 30.49 kg/m   BP Readings from Last 3 Encounters:  04/13/20 120/82  04/09/19 118/76  02/28/19 128/84    Wt Readings from Last 3 Encounters:  04/13/20 183 lb 3.2 oz (83.1 kg)  04/09/19 180 lb (81.6 kg)  02/28/19 170 lb (77.1 kg)    Physical Exam Constitutional:      General: He is not in acute distress.    Appearance: He is well-developed.     Comments: NAD  HENT:     Mouth/Throat:  Mouth: Oropharynx is clear and moist.  Eyes:     Conjunctiva/sclera: Conjunctivae normal.     Pupils: Pupils are equal, round, and reactive to light.  Neck:     Thyroid: No thyromegaly.     Vascular: No JVD.  Cardiovascular:     Rate and Rhythm: Normal rate and regular rhythm.     Pulses: Intact distal pulses.     Heart sounds: Normal heart sounds. No murmur heard. No friction rub. No gallop.   Pulmonary:     Effort: Pulmonary effort is normal. No respiratory distress.     Breath sounds: Normal breath sounds. No wheezing or rales.  Chest:     Chest wall: No tenderness.  Abdominal:     General: Bowel sounds are normal. There is no  distension.     Palpations: Abdomen is soft. There is no mass.     Tenderness: There is no abdominal tenderness. There is no guarding or rebound.  Genitourinary:    Rectum: Normal. Guaiac result negative.  Musculoskeletal:        General: Tenderness present. No edema. Normal range of motion.     Cervical back: Normal range of motion.  Lymphadenopathy:     Cervical: No cervical adenopathy.  Skin:    General: Skin is warm and dry.     Findings: No rash.  Neurological:     Mental Status: He is alert and oriented to person, place, and time.     Cranial Nerves: No cranial nerve deficit.     Motor: No abnormal muscle tone.     Coordination: He displays a negative Romberg sign. Coordination normal.     Gait: Gait normal.     Deep Tendon Reflexes: Reflexes are normal and symmetric.  Psychiatric:        Mood and Affect: Mood and affect normal.        Behavior: Behavior normal.        Thought Content: Thought content normal.        Judgment: Judgment normal.   prostate 1+   I spent 22 minutes in addition to time for CPX wellness examination in preparing to see the patient by review of recent labs, imaging and procedures, obtaining and reviewing separately obtained history, communicating with the patient, ordering medications, tests or procedures, and documenting clinical information in the EHR including the differential diagnosis, treatment, and any further evaluation and other management of BPH, allergies, OA.         Lab Results  Component Value Date   WBC 4.7 04/09/2019   HGB 15.0 04/09/2019   HCT 43.9 04/09/2019   PLT 207.0 04/09/2019   GLUCOSE 101 (H) 04/09/2019   CHOL 207 (H) 04/09/2019   TRIG 207.0 (H) 04/09/2019   HDL 41.10 04/09/2019   LDLDIRECT 136.0 04/09/2019   ALT 34 04/09/2019   AST 24 04/09/2019   NA 141 04/09/2019   K 4.6 04/09/2019   CL 106 04/09/2019   CREATININE 1.30 04/09/2019   BUN 13 04/09/2019   CO2 30 04/09/2019   TSH 7.47 (H) 04/09/2019   PSA 0.70  04/09/2019   HGBA1C 5.5 10/12/2010    DG Abd 1 View  Result Date: 05/23/2018 CLINICAL DATA:  55 year old male with right-sided nephrolithiasis and ureteral stone. EXAM: ABDOMEN - 1 VIEW COMPARISON:  CT scan 05/20/2018 FINDINGS: Gas within the colon completely obscures the right renal shadow and proximal and mid ureter. The right renal and proximal ureteral stone are not confidently identified on today's examination and  were much better seen on the recent CT scan. However, the left renal shadow is unobscured and at least 4 punctate stones are identified scattered in the interpolar and lower pole collecting system. Vascular phleboliths project over the anatomic pelvis. The bowel gas pattern is not obstructed. Osseous structures are intact and unremarkable. IMPRESSION: 1. Gas in the colon completely obscures the right renal shadow and proximal right ureter. The known right renal and proximal ureteral stones are not definitively identified. There is a faint linear radiopacity overlying the expected location of the right lower pole likely representing the renal stone. 2. At least 4 punctate stones are visible overlying the left interpolar and lower pole collecting system. Electronically Signed   By: Malachy Moan M.D.   On: 05/23/2018 07:39    Assessment & Plan:    Follow-up: No follow-ups on file.  Sonda Primes, MD

## 2020-04-13 NOTE — Assessment & Plan Note (Signed)
On Crestor 

## 2020-04-13 NOTE — Assessment & Plan Note (Signed)

## 2020-05-17 DIAGNOSIS — N201 Calculus of ureter: Secondary | ICD-10-CM | POA: Diagnosis not present

## 2020-05-20 ENCOUNTER — Telehealth: Payer: Self-pay | Admitting: Internal Medicine

## 2020-05-20 NOTE — Telephone Encounter (Signed)
Patient calling, states that he is wondering if we could send him in something for gout. Denies appointment at this time because he was seen at urgent care for the issue.  [id number] - [place], [place] - [address] Phone:  [phone]  Fax:  [phone]

## 2020-05-20 NOTE — Telephone Encounter (Signed)
Upon investigation, pt has not been diagnosed with gout by PCP.  Pt states he was seen approx 63month ago in UC & was given dx of gout & prednisone  to take. He wants to know if he can have that again.  Pt informed that he will have to f/u with PCP since this is a new dx that MD has not provided tx for in the past.  Appt made for 4/7 (earliest available)

## 2020-05-21 ENCOUNTER — Ambulatory Visit (INDEPENDENT_AMBULATORY_CARE_PROVIDER_SITE_OTHER): Payer: BC Managed Care – PPO

## 2020-05-21 ENCOUNTER — Encounter: Payer: Self-pay | Admitting: Family

## 2020-05-21 ENCOUNTER — Other Ambulatory Visit: Payer: Self-pay

## 2020-05-21 ENCOUNTER — Ambulatory Visit: Payer: BC Managed Care – PPO | Admitting: Family

## 2020-05-21 VITALS — BP 118/80 | HR 63 | Temp 97.3°F | Ht 65.0 in | Wt 178.6 lb

## 2020-05-21 DIAGNOSIS — M109 Gout, unspecified: Secondary | ICD-10-CM | POA: Diagnosis not present

## 2020-05-21 DIAGNOSIS — M79671 Pain in right foot: Secondary | ICD-10-CM | POA: Diagnosis not present

## 2020-05-21 LAB — CBC WITH DIFFERENTIAL/PLATELET
Basophils Absolute: 0.1 10*3/uL (ref 0.0–0.1)
Basophils Relative: 1.9 % (ref 0.0–3.0)
Eosinophils Absolute: 0.5 10*3/uL (ref 0.0–0.7)
Eosinophils Relative: 11.2 % — ABNORMAL HIGH (ref 0.0–5.0)
HCT: 44.3 % (ref 39.0–52.0)
Hemoglobin: 15.3 g/dL (ref 13.0–17.0)
Lymphocytes Relative: 31.6 % (ref 12.0–46.0)
Lymphs Abs: 1.5 10*3/uL (ref 0.7–4.0)
MCHC: 34.6 g/dL (ref 30.0–36.0)
MCV: 86.4 fl (ref 78.0–100.0)
Monocytes Absolute: 0.6 10*3/uL (ref 0.1–1.0)
Monocytes Relative: 12.3 % — ABNORMAL HIGH (ref 3.0–12.0)
Neutro Abs: 2 10*3/uL (ref 1.4–7.7)
Neutrophils Relative %: 43 % (ref 43.0–77.0)
Platelets: 207 10*3/uL (ref 150.0–400.0)
RBC: 5.13 Mil/uL (ref 4.22–5.81)
RDW: 13.4 % (ref 11.5–15.5)
WBC: 4.7 10*3/uL (ref 4.0–10.5)

## 2020-05-21 LAB — URIC ACID: Uric Acid, Serum: 7.1 mg/dL (ref 4.0–7.8)

## 2020-05-21 LAB — COMPREHENSIVE METABOLIC PANEL
ALT: 25 U/L (ref 0–53)
AST: 22 U/L (ref 0–37)
Albumin: 4.5 g/dL (ref 3.5–5.2)
Alkaline Phosphatase: 58 U/L (ref 39–117)
BUN: 21 mg/dL (ref 6–23)
CO2: 30 mEq/L (ref 19–32)
Calcium: 10 mg/dL (ref 8.4–10.5)
Chloride: 103 mEq/L (ref 96–112)
Creatinine, Ser: 1.42 mg/dL (ref 0.40–1.50)
GFR: 55.17 mL/min — ABNORMAL LOW (ref >60.00)
Glucose, Bld: 96 mg/dL (ref 70–99)
Potassium: 4.3 mEq/L (ref 3.5–5.1)
Sodium: 139 mEq/L (ref 135–145)
Total Bilirubin: 1.9 mg/dL — ABNORMAL HIGH (ref 0.2–1.2)
Total Protein: 7.4 g/dL (ref 6.0–8.3)

## 2020-05-21 MED ORDER — PREDNISONE 20 MG PO TABS: 20.0000 mg | ORAL_TABLET | Freq: Every day | ORAL | 0 refills | Status: DC

## 2020-05-21 NOTE — Progress Notes (Signed)
Kyle Griffin. is a 55 y.o. male with the following history as recorded in [employer]:  Patient Active Problem List   Diagnosis Date Noted  . Arthritis of hand 04/13/2020  . BPH (benign prostatic hyperplasia) 04/09/2019  . Cough 05/18/2016  . Right otitis media 12/11/2015  . 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 Tongue coating 10/26/2015  . Vertigo 05/31/2015  . Recurrent sinusitis 04/25/2013  . Abdominal pain, LLQ 01/20/2013  . Well adult exam 09/16/2012  . Insomnia 11/14/2011  . 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 Nephrolithiasis 05/12/2011  . Diarrhea 02/06/2011  . 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 Hepatic steatosis 05/26/2010  . Hyperglycemia 05/26/2010  . Abdominal pain, epigastric 04/11/2010  . LIVER FUNCTION TESTS, ABNORMAL, HX OF 04/11/2010  . Slowing of urinary stream 01/08/2009  . TOBACCO USE, QUIT 01/08/2009  . CERVICAL RADICULOPATHY 05/08/2008  . Chest pain 05/08/2008  . Acute sinus infection 03/12/2008  . ASTHMA 03/12/2008  . ASTHMATIC BRONCHITIS, ACUTE 07/25/2007  . Allergic rhinitis 07/25/2007  . GERD 07/25/2007  . IBS 07/25/2007  . Dyslipidemia 05/03/2007  . HEMORRHOIDS, NOS 05/03/2007  . ABNORMAL RESULT, FUNCTION STUDY, LIVER 10/01/2006    Current Outpatient Medications  Medication Sig Dispense Refill  . aspirin EC 81 MG tablet Take 81 mg by mouth daily.    . cetirizine (ZYRTEC) 10 MG tablet Take 10 mg by mouth daily as needed. allergies    . Cholecalciferol (VITAMIN D3) 50 MCG (2000 UT) capsule Take 1 capsule (2,000 Units total) by mouth daily. 100 capsule 3  . escitalopram (LEXAPRO) 10 MG tablet TAKE ONE TABLET BY MOUTH ONE TIME DAILY 90 tablet 1  . famotidine (PEPCID) 20 MG tablet Take 1 tablet (20 mg total) by mouth daily. ulcers (Patient taking differently: Take 20 mg by mouth as needed for heartburn. ulcers) 30 tablet 2  . finasteride (PROSCAR) 5 MG tablet Take 1 tablet (5 mg total) by mouth daily. 100 tablet 3  .  fluticasone (FLONASE) 50 MCG/ACT nasal spray Place 2 sprays into both nostrils daily. 16 g 6  . loratadine (CLARITIN) 10 MG tablet Take 1 tablet (10 mg total) by mouth daily. 90 tablet 3  . Potassium Citrate 15 MEQ (1620 MG) TBCR Take 1 by mouth twice a day 180 tablet 3  . predniSONE (DELTASONE) 20 MG tablet Take 1 tablet (20 mg total) by mouth daily with breakfast. 5 tablet 0  . rosuvastatin (CRESTOR) 5 MG tablet TAKE ONE TABLET BY MOUTH ONE TIME DAILY  90 tablet 3  . tamsulosin (FLOMAX) 0.4 MG CAPS capsule Take 1 capsule (0.4 mg total) by mouth daily. 90 capsule 3  . zolpidem (AMBIEN) 10 MG tablet Take 1 tablet (10 mg total) by mouth at bedtime as needed for sleep. 90 tablet 1   No current facility-administered medications for this visit.    Allergies: Oxycodone-acetaminophen  Past Medical History:  Diagnosis Date  . Allergic rhinitis   . Asthma, exercise induced    last exaberation was 10 years ago  . GERD (gastroesophageal reflux disease)   . Gilbert disease   . History of kidney stones   . Hyperlipidemia   . IBS (irritable bowel syndrome)   . Nephrolithiasis   . Renal stones     Past Surgical History:  Procedure Laterality Date  . EXTRACORPOREAL SHOCK WAVE LITHOTRIPSY Right 08/09/2017   Procedure: RIGHT EXTRACORPOREAL SHOCK WAVE LITHOTRIPSY (ESWL);  Surgeon: Festus Aloe, MD;  Location: [facility];  Service: Urology;  Laterality: Right;  .  EXTRACORPOREAL SHOCK WAVE LITHOTRIPSY Left 12/06/2017   Procedure: EXTRACORPOREAL SHOCK WAVE LITHOTRIPSY (ESWL);  Surgeon: Malen Gauze, MD;  Location: [facility];  Service: Urology;  Laterality: Left;  . EXTRACORPOREAL SHOCK WAVE LITHOTRIPSY Right 05/23/2018   Procedure: EXTRACORPOREAL SHOCK WAVE LITHOTRIPSY (ESWL);  Surgeon: Bjorn Pippin, MD;  Location: [facility];  Service: Urology;  Laterality: Right;  . FOOT SURGERY     Cyst, Heel  . ROTATOR CUFF REPAIR      Family History  Problem Relation Age of Onset  . Coronary artery disease Mother    . Stroke Mother   . Heart disease Mother   . Hyperlipidemia Mother   . Cancer Mother 61       lung ca  . Heart disease Father   . Hyperlipidemia Father   . Diabetes Neg Hx     Social History   Tobacco Use  . Smoking status: Former Smoker    Quit date: 12/23/1999    Years since quitting: 20.4  . Smokeless tobacco: Former Engineer, water Use Topics  . Alcohol use: No    Subjective:   Per patient, he was initially diagnosed with gout at [facility] last month- was given prednisone for treatment; no prior history of gout; notes that neither labs nor X-ray was done at [facility]; he notes that symptoms re-flared in the past 2 days- localized in right first toe; notes that pain is actually more noticeable on the bottom of the toe; denies any family history of gout; does not feel that he has eaten large amounts of seafood/ red meat recently; has been able to get his shoes on and off with no difficulty;    Objective:  Vitals:   05/21/20 1037  BP: 118/80  Pulse: 63  Temp: (!) 97.3 F (36.3 C)  TempSrc: Oral  SpO2: 97%  Weight: 178 lb 9.6 oz (81 kg)  Height: 5\' 5"  (1.651 m)    General: Well developed, well nourished, in no acute distress  Skin : Warm and dry.  Head: Normocephalic and atraumatic  Eyes: Sclera and conjunctiva clear; pupils round and reactive to light; extraocular movements intact  Ears: External normal; canals clear; tympanic membranes normal  Oropharynx: Pink, supple. No suspicious lesions  Neck: Supple without thyromegaly, adenopathy  Lungs: Respirations unlabored;  Musculoskeletal: No deformities; mild swelling but no erythema noted at base of 1st toe Extremities: No edema, cyanosis, clubbing  Vessels: Symmetric bilaterally  Neurologic: Alert and oriented; speech intact; face symmetrical; moves all extremities well; CNII-XII intact without focal deficit   Assessment:  1. Acute gout involving toe of right foot, unspecified cause   2. Right foot pain     Plan:  ? Gout vs  muscular source of foot pain; will update labs today including CBC, CMP, uric acid; update X-ray as well; Rx for Prednisone 20 mg qd x 5 days; follow-up to be determined; If uric acid level is elevated, to consider trial of Allopurinol; if uric acid is normal, to consider referral to foot specialist;  This visit occurred during the SARS-CoV-2 public health emergency.  Safety protocols were in place, including screening questions prior to the visit, additional usage of staff PPE, and extensive cleaning of exam room while observing appropriate contact time as indicated for disinfecting solutions.      No follow-ups on file.  Orders Placed This Encounter  Procedures  . DG Foot Complete Right    Standing Status:   Future    Number of Occurrences:   1  Standing Expiration Date:   05/21/2021    Order Specific Question:   Reason for Exam (SYMPTOM  OR DIAGNOSIS REQUIRED)    Answer:   right foot pain    Order Specific Question:   Preferred imaging location?    Answer:   [facility]  . CBC with Differential/Platelet    Standing Status:   Future    Number of Occurrences:   1    Standing Expiration Date:   05/21/2021  . Comp Met (CMET)    Standing Status:   Future    Number of Occurrences:   1    Standing Expiration Date:   05/21/2021  . Uric acid    Standing Status:   Future    Number of Occurrences:   1    Standing Expiration Date:   05/21/2021    Requested Prescriptions   Signed Prescriptions Disp Refills  . predniSONE (DELTASONE) 20 MG tablet 5 tablet 0    Sig: Take 1 tablet (20 mg total) by mouth daily with breakfast.

## 2020-05-21 NOTE — Telephone Encounter (Signed)
I agree with your plan.  If he is in severe pain from a gout attack, okay to send a prescription for Medrol Dosepak to use as directed while he is waiting for his appointment.  Thank you

## 2020-05-21 NOTE — Patient Instructions (Signed)
Gout  Gout is a condition that causes painful swelling of the joints. Gout is a type of inflammation of the joints (arthritis). This condition is caused by having too much uric acid in the body. Uric acid is a chemical that forms when the body breaks down substances called purines. Purines are important for building body proteins. When the body has too much uric acid, sharp crystals can form and build up inside the joints. This causes pain and swelling. Gout attacks can happen quickly and may be very painful (acute gout). Over time, the attacks can affect more joints and become more frequent (chronic gout). Gout can also cause uric acid to build up under the skin and inside the kidneys. What are the causes? This condition is caused by too much uric acid in your blood. This can happen because:  Your kidneys do not remove enough uric acid from your blood. This is the most common cause.  Your body makes too much uric acid. This can happen with some cancers and cancer treatments. It can also occur if your body is breaking down too many red blood cells (hemolytic anemia).  You eat too many foods that are high in purines. These foods include organ meats and some seafood. Alcohol, especially beer, is also high in purines. A gout attack may be triggered by trauma or stress. What increases the risk? You are more likely to develop this condition if you:  Have a family history of gout.  Are male and middle-aged.  Are male and have gone through menopause.  Are obese.  Frequently drink alcohol, especially beer.  Are dehydrated.  Lose weight too quickly.  Have an organ transplant.  Have lead poisoning.  Take certain medicines, including aspirin, cyclosporine, diuretics, levodopa, and niacin.  Have kidney disease.  Have a skin condition called psoriasis. What are the signs or symptoms? An attack of acute gout happens quickly. It usually occurs in just one joint. The most common place is  the big toe. Attacks often start at night. Other joints that may be affected include joints of the feet, ankle, knee, fingers, wrist, or elbow. Symptoms of this condition may include:  Severe pain.  Warmth.  Swelling.  Stiffness.  Tenderness. The affected joint may be very painful to touch.  Shiny, red, or purple skin.  Chills and fever. Chronic gout may cause symptoms more frequently. More joints may be involved. You may also have white or yellow lumps (tophi) on your hands or feet or in other areas near your joints.   How is this diagnosed? This condition is diagnosed based on your symptoms, medical history, and physical exam. You may have tests, such as:  Blood tests to measure uric acid levels.  Removal of joint fluid with a thin needle (aspiration) to look for uric acid crystals.  X-rays to look for joint damage. How is this treated? Treatment for this condition has two phases: treating an acute attack and preventing future attacks. Acute gout treatment may include medicines to reduce pain and swelling, including:  NSAIDs.  Steroids. These are strong anti-inflammatory medicines that can be taken by mouth (orally) or injected into a joint.  Colchicine. This medicine relieves pain and swelling when it is taken soon after an attack. It can be given by mouth or through an IV. Preventive treatment may include:  Daily use of smaller doses of NSAIDs or colchicine.  Use of a medicine that reduces uric acid levels in your blood.  Changes to your diet.   You may need to see a dietitian about what to eat and drink to prevent gout. Follow these instructions at home: During a gout attack  If directed, put ice on the affected area: ? Put ice in a plastic bag. ? Place a towel between your skin and the bag. ? Leave the ice on for 20 minutes, 2-3 times a day.  Raise (elevate) the affected joint above the level of your heart as often as possible.  Rest the joint as much as possible.  If the affected joint is in your leg, you may be given crutches to use.  Follow instructions from your health care provider about eating or drinking restrictions.   Avoiding future gout attacks  Follow a low-purine diet as told by your dietitian or health care provider. Avoid foods and drinks that are high in purines, including liver, kidney, anchovies, asparagus, herring, mushrooms, mussels, and beer.  Maintain a healthy weight or lose weight if you are overweight. If you want to lose weight, talk with your health care provider. It is important that you do not lose weight too quickly.  Start or maintain an exercise program as told by your health care provider. Eating and drinking  Drink enough fluids to keep your urine pale yellow.  If you drink alcohol: ? Limit how much you use to:  0-1 drink a day for women.  0-2 drinks a day for men. ? Be aware of how much alcohol is in your drink. In the [place] one drink equals one 12 oz bottle of beer (355 mL) one 5 oz glass of wine (148 mL), or one 1 oz glass of hard liquor (44 mL). General instructions  Take over-the-counter and prescription medicines only as told by your health care provider.  Do not drive or use heavy machinery while taking prescription pain medicine.  Return to your normal activities as told by your health care provider. Ask your health care provider what activities are safe for you.  Keep all follow-up visits as told by your health care provider. This is important. Contact a health care provider if you have:  Another gout attack.  Continuing symptoms of a gout attack after 10 days of treatment.  Side effects from your medicines.  Chills or a fever.  Burning pain when you urinate.  Pain in your lower back or belly. Get help right away if you:  Have severe or uncontrolled pain.  Cannot urinate. Summary  Gout is painful swelling of the joints caused by inflammation.  The most common site of pain is the big  toe, but it can affect other joints in the body.  Medicines and dietary changes can help to prevent and treat gout attacks. This information is not intended to replace advice given to you by your health care provider. Make sure you discuss any questions you have with your health care provider. Document Revised: 08/29/2017 Document Reviewed: 08/29/2017 Elsevier Patient Education  2021 [employer] Inc.  

## 2020-05-24 ENCOUNTER — Other Ambulatory Visit: Payer: Self-pay | Admitting: Family

## 2020-05-24 ENCOUNTER — Encounter: Payer: Self-pay | Admitting: Family

## 2020-05-24 MED ORDER — INDOMETHACIN 50 MG PO CAPS: 50.0000 mg | ORAL_CAPSULE | Freq: Three times a day (TID) | ORAL | 0 refills | Status: DC | PRN

## 2020-05-27 ENCOUNTER — Ambulatory Visit: Payer: BC Managed Care – PPO | Admitting: Internal Medicine

## 2020-06-01 ENCOUNTER — Other Ambulatory Visit: Payer: Self-pay | Admitting: Internal Medicine

## 2020-06-28 DIAGNOSIS — R0989 Other specified symptoms and signs involving the circulatory and respiratory systems: Secondary | ICD-10-CM | POA: Diagnosis not present

## 2020-06-28 DIAGNOSIS — Z20822 Contact with and (suspected) exposure to covid-19: Secondary | ICD-10-CM | POA: Diagnosis not present

## 2020-06-30 ENCOUNTER — Encounter: Payer: Self-pay | Admitting: Internal Medicine

## 2020-06-30 ENCOUNTER — Ambulatory Visit: Payer: BC Managed Care – PPO | Admitting: Internal Medicine

## 2020-06-30 ENCOUNTER — Other Ambulatory Visit: Payer: Self-pay

## 2020-06-30 DIAGNOSIS — J019 Acute sinusitis, unspecified: Secondary | ICD-10-CM

## 2020-06-30 MED ORDER — CEFDINIR 300 MG PO CAPS: 300.0000 mg | ORAL_CAPSULE | Freq: Two times a day (BID) | ORAL | 0 refills | Status: DC

## 2020-06-30 NOTE — Assessment & Plan Note (Signed)
Start Omnicef 

## 2020-06-30 NOTE — Progress Notes (Signed)
Subjective:  Patient ID: Kyle Haskew., male    DOB: September 03, 1965  Age: 55 y.o. MRN: [id number]  CC: Sinusitis and Sore Throat   HPI Kyle Stangl. presents for green nasal d/c, HAs x 1 wk. He was COVID19 (-).  Outpatient Medications Prior to Visit  Medication Sig Dispense Refill  . aspirin EC 81 MG tablet Take 81 mg by mouth daily.    . cetirizine (ZYRTEC) 10 MG tablet Take 10 mg by mouth daily as needed. allergies    . Cholecalciferol (VITAMIN D3) 50 MCG (2000 UT) capsule Take 1 capsule (2,000 Units total) by mouth daily. 100 capsule 3  . escitalopram (LEXAPRO) 10 MG tablet TAKE ONE TABLET BY MOUTH ONE TIME DAILY 90 tablet 1  . famotidine (PEPCID) 20 MG tablet Take 1 tablet (20 mg total) by mouth daily. ulcers (Patient taking differently: Take 20 mg by mouth as needed for heartburn. ulcers) 30 tablet 2  . finasteride (PROSCAR) 5 MG tablet Take 1 tablet (5 mg total) by mouth daily. 100 tablet 3  . fluticasone (FLONASE) 50 MCG/ACT nasal spray Place 2 sprays into both nostrils daily. 16 g 6  . indomethacin (INDOCIN) 50 MG capsule Take 1 capsule (50 mg total) by mouth 3 (three) times daily as needed for moderate pain. 30 capsule 0  . loratadine (CLARITIN) 10 MG tablet Take 1 tablet (10 mg total) by mouth daily. 90 tablet 3  . Potassium Citrate 15 MEQ (1620 MG) TBCR Take 1 by mouth twice a day 180 tablet 3  . predniSONE (DELTASONE) 20 MG tablet Take 1 tablet (20 mg total) by mouth daily with breakfast. 5 tablet 0  . rosuvastatin (CRESTOR) 5 MG tablet TAKE ONE TABLET BY MOUTH ONE TIME DAILY 90 tablet 3  . tamsulosin (FLOMAX) 0.4 MG CAPS capsule Take 1 capsule (0.4 mg total) by mouth daily. 90 capsule 3  . zolpidem (AMBIEN) 10 MG tablet Take 1 tablet (10 mg total) by mouth at bedtime as needed for sleep. 90 tablet 1   No facility-administered medications prior to visit.    ROS: Review of Systems  Constitutional: Positive for fatigue.  HENT: Positive for postnasal drip,  rhinorrhea and sinus pain.   Neurological: Positive for headaches.    Objective:  BP (!) 142/90 (BP Location: Left Arm)   Pulse 83   Temp 99 F (37.2 C) (Oral)   Wt 175 lb 6.4 oz (79.6 kg)   SpO2 95%   BMI 29.19 kg/m   BP Readings from Last 3 Encounters:  06/30/20 (!) 142/90  05/21/20 118/80  04/13/20 120/82    Wt Readings from Last 3 Encounters:  06/30/20 175 lb 6.4 oz (79.6 kg)  05/21/20 178 lb 9.6 oz (81 kg)  04/13/20 183 lb 3.2 oz (83.1 kg)    Physical Exam Constitutional:      General: He is not in acute distress.    Appearance: He is well-developed.     Comments: NAD  Eyes:     Conjunctiva/sclera: Conjunctivae normal.     Pupils: Pupils are equal, round, and reactive to light.  Neck:     Thyroid: No thyromegaly.     Vascular: No JVD.  Cardiovascular:     Rate and Rhythm: Normal rate and regular rhythm.     Heart sounds: Normal heart sounds. No murmur heard. No friction rub. No gallop.   Pulmonary:     Effort: Pulmonary effort is normal. No respiratory distress.     Breath sounds:  Normal breath sounds. No wheezing or rales.  Chest:     Chest wall: No tenderness.  Abdominal:     General: Bowel sounds are normal. There is no distension.     Palpations: Abdomen is soft. There is no mass.     Tenderness: There is no abdominal tenderness. There is no guarding or rebound.  Musculoskeletal:        General: No tenderness. Normal range of motion.     Cervical back: Normal range of motion.  Lymphadenopathy:     Cervical: No cervical adenopathy.  Skin:    General: Skin is warm and dry.     Findings: No rash.  Neurological:     Mental Status: He is alert and oriented to person, place, and time.     Cranial Nerves: No cranial nerve deficit.     Motor: No abnormal muscle tone.     Coordination: Coordination normal.     Gait: Gait normal.     Deep Tendon Reflexes: Reflexes are normal and symmetric.  Psychiatric:        Behavior: Behavior normal.        Thought  Content: Thought content normal.        Judgment: Judgment normal.    Swollen nares  Lab Results  Component Value Date   WBC 4.7 05/21/2020   HGB 15.3 05/21/2020   HCT 44.3 05/21/2020   PLT 207.0 05/21/2020   GLUCOSE 96 05/21/2020   CHOL 196 04/13/2020   TRIG 189.0 (H) 04/13/2020   HDL 40.90 04/13/2020   LDLDIRECT 136.0 04/09/2019   LDLCALC 118 (H) 04/13/2020   ALT 25 05/21/2020   AST 22 05/21/2020   NA 139 05/21/2020   K 4.3 05/21/2020   CL 103 05/21/2020   CREATININE 1.42 05/21/2020   BUN 21 05/21/2020   CO2 30 05/21/2020   TSH 5.44 (H) 04/13/2020   PSA 0.52 04/13/2020   HGBA1C 5.5 10/12/2010    DG Abd 1 View  Result Date: 05/23/2018 CLINICAL DATA:  55 year old male with right-sided nephrolithiasis and ureteral stone. EXAM: ABDOMEN - 1 VIEW COMPARISON:  CT scan 05/20/2018 FINDINGS: Gas within the colon completely obscures the right renal shadow and proximal and mid ureter. The right renal and proximal ureteral stone are not confidently identified on today's examination and were much better seen on the recent CT scan. However, the left renal shadow is unobscured and at least 4 punctate stones are identified scattered in the interpolar and lower pole collecting system. Vascular phleboliths project over the anatomic pelvis. The bowel gas pattern is not obstructed. Osseous structures are intact and unremarkable. IMPRESSION: 1. Gas in the colon completely obscures the right renal shadow and proximal right ureter. The known right renal and proximal ureteral stones are not definitively identified. There is a faint linear radiopacity overlying the expected location of the right lower pole likely representing the renal stone. 2. At least 4 punctate stones are visible overlying the left interpolar and lower pole collecting system. Electronically Signed   By: Malachy Moan M.D.   On: 05/23/2018 07:39    Assessment & Plan:   There are no diagnoses linked to this encounter.   No  orders of the defined types were placed in this encounter.    Follow-up: No follow-ups on file.  Sonda Primes, MD

## 2020-07-12 ENCOUNTER — Ambulatory Visit: Payer: BC Managed Care – PPO | Admitting: Internal Medicine

## 2020-08-09 ENCOUNTER — Other Ambulatory Visit: Payer: Self-pay | Admitting: Internal Medicine

## 2020-08-24 ENCOUNTER — Other Ambulatory Visit: Payer: Self-pay

## 2020-08-24 ENCOUNTER — Ambulatory Visit: Payer: BC Managed Care – PPO | Admitting: Internal Medicine

## 2020-08-24 ENCOUNTER — Encounter: Payer: Self-pay | Admitting: Internal Medicine

## 2020-08-24 DIAGNOSIS — E785 Hyperlipidemia, unspecified: Secondary | ICD-10-CM

## 2020-08-24 DIAGNOSIS — J329 Chronic sinusitis, unspecified: Secondary | ICD-10-CM | POA: Diagnosis not present

## 2020-08-24 MED ORDER — PSEUDOEPHEDRINE HCL ER 120 MG PO TB12: 120.0000 mg | ORAL_TABLET | Freq: Two times a day (BID) | ORAL | 1 refills | Status: AC | PRN

## 2020-08-24 NOTE — Patient Instructions (Signed)

## 2020-08-24 NOTE — Progress Notes (Signed)
Subjective:  Patient ID: Kyle Hudnall., male    DOB: 12-08-1965  Age: 55 y.o. MRN: [id number]  CC: No chief complaint on file.   HPI Kyle Griffin. presents for nasal congestion, dyslipidemia  Outpatient Medications Prior to Visit  Medication Sig Dispense Refill   aspirin EC 81 MG tablet Take 81 mg by mouth daily.     cefdinir (OMNICEF) 300 MG capsule Take 1 capsule (300 mg total) by mouth 2 (two) times daily. 20 capsule 0   cetirizine (ZYRTEC) 10 MG tablet Take 10 mg by mouth daily as needed. allergies     Cholecalciferol (VITAMIN D3) 50 MCG (2000 UT) capsule Take 1 capsule (2,000 Units total) by mouth daily. 100 capsule 3   escitalopram (LEXAPRO) 10 MG tablet TAKE ONE TABLET BY MOUTH ONE TIME DAILY 90 tablet 2   famotidine (PEPCID) 20 MG tablet Take 1 tablet (20 mg total) by mouth daily. ulcers (Patient taking differently: Take 20 mg by mouth as needed for heartburn. ulcers) 30 tablet 2   finasteride (PROSCAR) 5 MG tablet Take 1 tablet (5 mg total) by mouth daily. 100 tablet 3   fluticasone (FLONASE) 50 MCG/ACT nasal spray Place 2 sprays into both nostrils daily. 16 g 6   indomethacin (INDOCIN) 50 MG capsule Take 1 capsule (50 mg total) by mouth 3 (three) times daily as needed for moderate pain. 30 capsule 0   loratadine (CLARITIN) 10 MG tablet Take 1 tablet (10 mg total) by mouth daily. 90 tablet 3   Potassium Citrate 15 MEQ (1620 MG) TBCR Take 1 by mouth twice a day 180 tablet 3   predniSONE (DELTASONE) 20 MG tablet Take 1 tablet (20 mg total) by mouth daily with breakfast. 5 tablet 0   rosuvastatin (CRESTOR) 5 MG tablet TAKE ONE TABLET BY MOUTH ONE TIME DAILY 90 tablet 3   tamsulosin (FLOMAX) 0.4 MG CAPS capsule Take 1 capsule (0.4 mg total) by mouth daily. 90 capsule 3   zolpidem (AMBIEN) 10 MG tablet Take 1 tablet (10 mg total) by mouth at bedtime as needed for sleep. 90 tablet 1   No facility-administered medications prior to visit.    ROS: Review of Systems   Constitutional:  Negative for fatigue and fever.  HENT:  Positive for sinus pressure. Negative for postnasal drip.   Respiratory:  Negative for cough.    Objective:  BP 136/80 (BP Location: Right Arm, Patient Position: Sitting, Cuff Size: Normal)   Pulse 74   Temp 99.4 F (37.4 C) (Oral)   Resp 16   Ht 5\' 5"  (1.651 m)   Wt 175 lb (79.4 kg)   SpO2 95%   BMI 29.12 kg/m   BP Readings from Last 3 Encounters:  08/24/20 136/80  06/30/20 (!) 142/90  05/21/20 118/80    Wt Readings from Last 3 Encounters:  08/24/20 175 lb (79.4 kg)  06/30/20 175 lb 6.4 oz (79.6 kg)  05/21/20 178 lb 9.6 oz (81 kg)    Physical Exam Constitutional:      General: He is not in acute distress.    Appearance: He is well-developed.     Comments: NAD  Eyes:     Conjunctiva/sclera: Conjunctivae normal.     Pupils: Pupils are equal, round, and reactive to light.  Neck:     Thyroid: No thyromegaly.     Vascular: No JVD.  Cardiovascular:     Rate and Rhythm: Normal rate and regular rhythm.     Heart sounds:  Normal heart sounds. No murmur heard.   No friction rub. No gallop.  Pulmonary:     Effort: Pulmonary effort is normal. No respiratory distress.     Breath sounds: Normal breath sounds. No wheezing or rales.  Chest:     Chest wall: No tenderness.  Abdominal:     General: Bowel sounds are normal. There is no distension.     Palpations: Abdomen is soft. There is no mass.     Tenderness: There is no abdominal tenderness. There is no guarding or rebound.  Musculoskeletal:        General: No tenderness. Normal range of motion.     Cervical back: Normal range of motion.  Lymphadenopathy:     Cervical: No cervical adenopathy.  Skin:    General: Skin is warm and dry.     Findings: No rash.  Neurological:     Mental Status: He is alert and oriented to person, place, and time.     Cranial Nerves: No cranial nerve deficit.     Motor: No abnormal muscle tone.     Coordination: Coordination normal.      Gait: Gait normal.     Deep Tendon Reflexes: Reflexes are normal and symmetric.  Psychiatric:        Behavior: Behavior normal.        Thought Content: Thought content normal.        Judgment: Judgment normal.    Lab Results  Component Value Date   WBC 4.7 05/21/2020   HGB 15.3 05/21/2020   HCT 44.3 05/21/2020   PLT 207.0 05/21/2020   GLUCOSE 96 05/21/2020   CHOL 196 04/13/2020   TRIG 189.0 (H) 04/13/2020   HDL 40.90 04/13/2020   LDLDIRECT 136.0 04/09/2019   LDLCALC 118 (H) 04/13/2020   ALT 25 05/21/2020   AST 22 05/21/2020   NA 139 05/21/2020   K 4.3 05/21/2020   CL 103 05/21/2020   CREATININE 1.42 05/21/2020   BUN 21 05/21/2020   CO2 30 05/21/2020   TSH 5.44 (H) 04/13/2020   PSA 0.52 04/13/2020   HGBA1C 5.5 10/12/2010    DG Abd 1 View  Result Date: 05/23/2018 CLINICAL DATA:  55 year old male with right-sided nephrolithiasis and ureteral stone. EXAM: ABDOMEN - 1 VIEW COMPARISON:  CT scan 05/20/2018 FINDINGS: Gas within the colon completely obscures the right renal shadow and proximal and mid ureter. The right renal and proximal ureteral stone are not confidently identified on today's examination and were much better seen on the recent CT scan. However, the left renal shadow is unobscured and at least 4 punctate stones are identified scattered in the interpolar and lower pole collecting system. Vascular phleboliths project over the anatomic pelvis. The bowel gas pattern is not obstructed. Osseous structures are intact and unremarkable. IMPRESSION: 1. Gas in the colon completely obscures the right renal shadow and proximal right ureter. The known right renal and proximal ureteral stones are not definitively identified. There is a faint linear radiopacity overlying the expected location of the right lower pole likely representing the renal stone. 2. At least 4 punctate stones are visible overlying the left interpolar and lower pole collecting system. Electronically Signed   By:  Malachy Moan M.D.   On: 05/23/2018 07:39    Assessment & Plan:   There are no diagnoses linked to this encounter.   No orders of the defined types were placed in this encounter.    Follow-up: No follow-ups on file.  Sonda Primes, MD

## 2020-09-07 ENCOUNTER — Telehealth: Payer: BC Managed Care – PPO | Admitting: Emergency Medicine

## 2020-09-07 DIAGNOSIS — H938X3 Other specified disorders of ear, bilateral: Secondary | ICD-10-CM

## 2020-09-07 NOTE — Progress Notes (Signed)
Based on what you shared with me, I feel your condition warrants further evaluation and I recommend that you be seen for a face to face visit.  Please contact your primary care physician practice to be seen. Many offices offer virtual options to be seen via video if you are not comfortable going in person to a medical facility at this time.  You need to have an ear exam for proper diagnosis and treatment.  The symptoms you describe are inconsistent with "Swimmer's Ear" or otitis externa.  Therefore, a visual inspection is indicated.  I'm sorry, I won't be able to treat this condition without a physical exam.  NOTE: You will NOT be charged for this eVisit.  If you do not have a PCP, [facility] offers a free physician referral service available at [phone]. Our trained staff has the experience, knowledge and resources to put you in touch with a physician who is right for you.    If you are having a true medical emergency please call 911.   Your e-visit answers were reviewed by a board certified advanced clinical practitioner to complete your personal care plan.  Thank you for using e-Visits.  Approximately 5 minutes was used in reviewing the patient's chart, questionnaire, prescribing medications, and documentation.

## 2020-09-08 ENCOUNTER — Encounter: Payer: Self-pay | Admitting: Internal Medicine

## 2020-09-08 ENCOUNTER — Other Ambulatory Visit: Payer: Self-pay

## 2020-09-08 ENCOUNTER — Ambulatory Visit: Payer: BC Managed Care – PPO | Admitting: Internal Medicine

## 2020-09-08 DIAGNOSIS — T700XXA Otitic barotrauma, initial encounter: Secondary | ICD-10-CM | POA: Diagnosis not present

## 2020-09-08 MED ORDER — METHYLPREDNISOLONE 4 MG PO TBPK: ORAL_TABLET | ORAL | 0 refills | Status: DC

## 2020-09-08 MED ORDER — NEOMYCIN-POLYMYXIN-HC 3.5-10000-1 OT SOLN
3.0000 [drp] | Freq: Three times a day (TID) | OTIC | 3 refills | Status: AC
Start: 2020-09-08 — End: 2020-12-17

## 2020-09-08 MED ORDER — CEFDINIR 300 MG PO CAPS: 300.0000 mg | ORAL_CAPSULE | Freq: Two times a day (BID) | ORAL | 0 refills | Status: DC
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
## 2020-09-22 ENCOUNTER — Encounter: Payer: Self-pay | Admitting: Internal Medicine

## 2020-09-22 NOTE — Assessment & Plan Note (Signed)
Use sudafed before diving

## 2020-09-22 NOTE — Assessment & Plan Note (Signed)
Cont Crestor Coronary calcium CT is ordered

## 2020-10-05 ENCOUNTER — Ambulatory Visit (INDEPENDENT_AMBULATORY_CARE_PROVIDER_SITE_OTHER)
Admission: RE | Admit: 2020-10-05 | Discharge: 2020-10-05 | Disposition: A | Discharge status: Home / Self Care | Payer: Self-pay | Source: Ambulatory Visit | Attending: Internal Medicine | Admitting: Internal Medicine

## 2020-10-05 ENCOUNTER — Other Ambulatory Visit: Payer: Self-pay

## 2020-10-05 DIAGNOSIS — E785 Hyperlipidemia, unspecified: Secondary | ICD-10-CM

## 2020-12-15 ENCOUNTER — Encounter: Payer: Self-pay | Admitting: Internal Medicine

## 2020-12-15 ENCOUNTER — Telehealth (INDEPENDENT_AMBULATORY_CARE_PROVIDER_SITE_OTHER): Payer: BC Managed Care – PPO | Admitting: Internal Medicine

## 2020-12-15 DIAGNOSIS — J019 Acute sinusitis, unspecified: Secondary | ICD-10-CM

## 2020-12-15 MED ORDER — CEFDINIR 300 MG PO CAPS: 300.0000 mg | ORAL_CAPSULE | Freq: Two times a day (BID) | ORAL | 0 refills | Status: DC

## 2020-12-15 NOTE — Progress Notes (Signed)
Virtual Visit via Video Note  I connected with Kyle Griffin. on 12/15/20 at  1:40 PM EDT by a video enabled telemedicine application and verified that I am speaking with the correct person using two identifiers.   I discussed the limitations of evaluation and management by telemedicine and the availability of in person appointments. The patient expressed understanding and agreed to proceed.  Present for the visit:  Myself, Dr Cheryll Cockayne, [name].  The patient is currently at home and I am in the office.    No referring provider.    History of Present Illness: He is here for an acute visit for cold symptoms.  His symptoms started a few days ago  He is experiencing nasal congestion, mild right ear pain, sinus pain, minimal sore throat, cough with some production of sputum that is discolored and some headaches.  He does have a history of sinus infections and feels this is typical for what he typically gets with a sinus infection.  He denies exposure to possible COVID.  He denies shortness of breath, fever and body aches.  He has tried taking mucinex daytime and night time and afrin.    Review of Systems  Constitutional:  Negative for chills and fever.  HENT:  Positive for congestion, ear pain (right - mild), sinus pain and sore throat (minimal).   Respiratory:  Positive for cough and sputum production (yellow- green). Negative for shortness of breath and wheezing.   Cardiovascular:  Negative for chest pain.  Gastrointestinal:  Negative for diarrhea and nausea.  Musculoskeletal:  Negative for myalgias.  Neurological:  Positive for headaches. Negative for dizziness.     Social History   Socioeconomic History   Marital status: Married    Spouse name: Not on file   Number of children: 2   Years of education: Not on file   Highest education level: Not on file  Occupational History   Occupation: Production designer, theatre/television/film Time Psychologist, forensic  Tobacco Use   Smoking status: Former    Types:  Cigarettes    Quit date: 12/23/1999    Years since quitting: 20.9   Smokeless tobacco: Former  Building services engineer Use: Never used  Substance and Sexual Activity   Alcohol use: No   Drug use: No   Sexual activity: Yes  Other Topics Concern   Not on file  Social History Narrative   Regular Exercise -  YES         Social Determinants of Health   Financial Resource Strain: Not on file  Food Insecurity: Not on file  Transportation Needs: Not on file  Physical Activity: Not on file  Stress: Not on file  Social Connections: Not on file     Observations/Objective: Appears well in NAD Sounds congested Breathing normally Skin appears warm and dry  Assessment and Plan:  Acute sinus infection: Acute Has a significant history of sinus infections, bronchitis Likely bacterial  Start Omnicef 300 mg BID x 10 day otc cold medications Rest, fluid Call if no improvement    Follow Up Instructions:    I discussed the assessment and treatment plan with the patient. The patient was provided an opportunity to ask questions and all were answered. The patient agreed with the plan and demonstrated an understanding of the instructions.   The patient was advised to call back or seek an in-person evaluation if the symptoms worsen or if the condition fails to improve as anticipated.    Pincus Sanes, MD

## 2021-04-18 ENCOUNTER — Ambulatory Visit (INDEPENDENT_AMBULATORY_CARE_PROVIDER_SITE_OTHER): Payer: No Typology Code available for payment source

## 2021-04-18 ENCOUNTER — Other Ambulatory Visit: Payer: Self-pay

## 2021-04-18 ENCOUNTER — Encounter: Payer: Self-pay | Admitting: Internal Medicine

## 2021-04-18 ENCOUNTER — Ambulatory Visit (INDEPENDENT_AMBULATORY_CARE_PROVIDER_SITE_OTHER): Payer: No Typology Code available for payment source | Admitting: Internal Medicine

## 2021-04-18 VITALS — BP 122/78 | HR 70 | Temp 98.6°F | Ht 65.0 in | Wt 175.0 lb

## 2021-04-18 DIAGNOSIS — R151 Fecal smearing: Secondary | ICD-10-CM | POA: Diagnosis not present

## 2021-04-18 DIAGNOSIS — Z23 Encounter for immunization: Secondary | ICD-10-CM

## 2021-04-18 DIAGNOSIS — M7710 Lateral epicondylitis, unspecified elbow: Secondary | ICD-10-CM | POA: Insufficient documentation

## 2021-04-18 DIAGNOSIS — R159 Full incontinence of feces: Secondary | ICD-10-CM | POA: Insufficient documentation

## 2021-04-18 DIAGNOSIS — Z0000 Encounter for general adult medical examination without abnormal findings: Secondary | ICD-10-CM | POA: Diagnosis not present

## 2021-04-18 DIAGNOSIS — M7712 Lateral epicondylitis, left elbow: Secondary | ICD-10-CM

## 2021-04-18 DIAGNOSIS — Z0001 Encounter for general adult medical examination with abnormal findings: Secondary | ICD-10-CM | POA: Diagnosis not present

## 2021-04-18 LAB — CBC WITH DIFFERENTIAL/PLATELET
Basophils Absolute: 0 10*3/uL (ref 0.0–0.1)
Basophils Relative: 0.9 % (ref 0.0–3.0)
Eosinophils Absolute: 0.3 10*3/uL (ref 0.0–0.7)
Eosinophils Relative: 7.4 % — ABNORMAL HIGH (ref 0.0–5.0)
HCT: 40.9 % (ref 39.0–52.0)
Hemoglobin: 14 g/dL (ref 13.0–17.0)
Lymphocytes Relative: 31.2 % (ref 12.0–46.0)
Lymphs Abs: 1.4 10*3/uL (ref 0.7–4.0)
MCHC: 34.3 g/dL (ref 30.0–36.0)
MCV: 86 fl (ref 78.0–100.0)
Monocytes Absolute: 0.6 10*3/uL (ref 0.1–1.0)
Monocytes Relative: 14.7 % — ABNORMAL HIGH (ref 3.0–12.0)
Neutro Abs: 2 10*3/uL (ref 1.4–7.7)
Neutrophils Relative %: 45.8 % (ref 43.0–77.0)
Platelets: 206 10*3/uL (ref 150.0–400.0)
RBC: 4.76 Mil/uL (ref 4.22–5.81)
RDW: 13.2 % (ref 11.5–15.5)
WBC: 4.4 10*3/uL (ref 4.0–10.5)

## 2021-04-18 LAB — URINALYSIS
Bilirubin Urine: NEGATIVE
Hgb urine dipstick: NEGATIVE
Ketones, ur: NEGATIVE
Leukocytes,Ua: NEGATIVE
Nitrite: NEGATIVE
Specific Gravity, Urine: 1.025 (ref 1.000–1.030)
Total Protein, Urine: NEGATIVE
Urine Glucose: NEGATIVE
Urobilinogen, UA: 0.2 (ref 0.0–1.0)
pH: 6 (ref 5.0–8.0)

## 2021-04-18 LAB — COMPREHENSIVE METABOLIC PANEL
ALT: 29 U/L (ref 0–53)
AST: 36 U/L (ref 0–37)
Albumin: 4.4 g/dL (ref 3.5–5.2)
Alkaline Phosphatase: 64 U/L (ref 39–117)
BUN: 22 mg/dL (ref 6–23)
CO2: 29 mEq/L (ref 19–32)
Calcium: 9.4 mg/dL (ref 8.4–10.5)
Chloride: 106 mEq/L (ref 96–112)
Creatinine, Ser: 1.29 mg/dL (ref 0.40–1.50)
GFR: 61.51 mL/min (ref >60.00)
Glucose, Bld: 98 mg/dL (ref 70–99)
Potassium: 4.2 mEq/L (ref 3.5–5.1)
Sodium: 141 mEq/L (ref 135–145)
Total Bilirubin: 3.2 mg/dL — ABNORMAL HIGH (ref 0.2–1.2)
Total Protein: 6.7 g/dL (ref 6.0–8.3)

## 2021-04-18 LAB — LIPID PANEL
Cholesterol: 180 mg/dL (ref 0–200)
HDL: 41.9 mg/dL (ref >39.00)
LDL Cholesterol: 100 mg/dL — ABNORMAL HIGH (ref 0–99)
NonHDL: 138.2
Total CHOL/HDL Ratio: 4
Triglycerides: 191 mg/dL — ABNORMAL HIGH (ref 0.0–149.0)
VLDL: 38.2 mg/dL (ref 0.0–40.0)

## 2021-04-18 MED ORDER — TRIAMCINOLONE ACETONIDE 0.1 % EX OINT: 1.0000 "application " | TOPICAL_OINTMENT | Freq: Two times a day (BID) | CUTANEOUS | 2 refills | Status: AC

## 2021-04-18 MED ORDER — METAMUCIL 0.52 G PO CAPS: 0.5200 g | ORAL_CAPSULE | Freq: Every day | ORAL | 11 refills | Status: AC

## 2021-04-18 NOTE — Assessment & Plan Note (Addendum)
C/o stool leakage x 8 - 9 months Pt had (-) Cologuard in 2020 Start Metamucil GI ref Triamc oint externally prn. Supp offered

## 2021-04-18 NOTE — Progress Notes (Signed)
Subjective:  Patient ID: Kyle Griffin., male    DOB: [birth year]  Age: 56 y.o. MRN: [id number]  CC: No chief complaint on file.   HPI Kyle Griffin. presents for a well exam C/o L elbow pain C/o stool leakage x 8 - 9 months  Pt had (-) Cologuard in 2020    Outpatient Medications Prior to Visit  Medication Sig Dispense Refill   aspirin EC 81 MG tablet Take 81 mg by mouth daily.     Cholecalciferol (VITAMIN D3) 50 MCG (2000 UT) capsule Take 1 capsule (2,000 Units total) by mouth daily. 100 capsule 3   escitalopram (LEXAPRO) 10 MG tablet TAKE ONE TABLET BY MOUTH ONE TIME DAILY 90 tablet 2   famotidine (PEPCID) 20 MG tablet Take 1 tablet (20 mg total) by mouth daily. ulcers (Patient taking differently: Take 20 mg by mouth as needed for heartburn. ulcers) 30 tablet 2   fluticasone (FLONASE) 50 MCG/ACT nasal spray Place 2 sprays into both nostrils daily. 16 g 6   indomethacin (INDOCIN) 50 MG capsule Take 1 capsule (50 mg total) by mouth 3 (three) times daily as needed for moderate pain. 30 capsule 0   loratadine (CLARITIN) 10 MG tablet Take 1 tablet (10 mg total) by mouth daily. 90 tablet 3   Potassium Citrate 15 MEQ (1620 MG) TBCR Take 1 by mouth twice a day 180 tablet 3   pseudoephedrine (SUDAFED) 120 MG 12 hr tablet Take 1 tablet (120 mg total) by mouth 2 (two) times daily as needed for congestion. 60 tablet 1   rosuvastatin (CRESTOR) 5 MG tablet TAKE ONE TABLET BY MOUTH ONE TIME DAILY 90 tablet 3   tamsulosin (FLOMAX) 0.4 MG CAPS capsule Take 1 capsule (0.4 mg total) by mouth daily. 90 capsule 3   zolpidem (AMBIEN) 10 MG tablet Take 1 tablet (10 mg total) by mouth at bedtime as needed for sleep. 90 tablet 1   cefdinir (OMNICEF) 300 MG capsule Take 1 capsule (300 mg total) by mouth 2 (two) times daily. 20 capsule 0   No facility-administered medications prior to visit.    ROS: Review of Systems  Constitutional:  Negative for appetite change, fatigue and unexpected  weight change.  HENT:  Negative for congestion, nosebleeds, sneezing, sore throat and trouble swallowing.   Eyes:  Negative for itching and visual disturbance.  Respiratory:  Negative for cough.   Cardiovascular:  Negative for chest pain, palpitations and leg swelling.  Gastrointestinal:  Negative for abdominal distention, blood in stool, diarrhea and nausea.  Genitourinary:  Negative for frequency and hematuria.  Musculoskeletal:  Negative for back pain, gait problem, joint swelling and neck pain.  Skin:  Negative for rash.  Neurological:  Negative for dizziness, tremors, speech difficulty and weakness.  Psychiatric/Behavioral:  Negative for agitation, dysphoric mood, sleep disturbance and suicidal ideas. The patient is not nervous/anxious.    Objective:  BP 122/78 (BP Location: Left Arm, Patient Position: Sitting, Cuff Size: Large)    Pulse 70    Temp 98.6 F (37 C) (Oral)    Ht 5\' 5"  (1.651 m)    Wt 175 lb (79.4 kg)    SpO2 96%    BMI 29.12 kg/m   BP Readings from Last 3 Encounters:  04/18/21 122/78  09/08/20 128/82  08/24/20 136/80    Wt Readings from Last 3 Encounters:  04/18/21 175 lb (79.4 kg)  08/24/20 175 lb (79.4 kg)  06/30/20 175 lb 6.4 oz (79.6 kg)  Physical Exam Constitutional:      General: He is not in acute distress.    Appearance: He is well-developed.     Comments: NAD  Eyes:     Conjunctiva/sclera: Conjunctivae normal.     Pupils: Pupils are equal, round, and reactive to light.  Neck:     Thyroid: No thyromegaly.     Vascular: No JVD.  Cardiovascular:     Rate and Rhythm: Normal rate and regular rhythm.     Heart sounds: Normal heart sounds. No murmur heard.   No friction rub. No gallop.  Pulmonary:     Effort: Pulmonary effort is normal. No respiratory distress.     Breath sounds: Normal breath sounds. No wheezing or rales.  Chest:     Chest wall: No tenderness.  Abdominal:     General: Bowel sounds are normal. There is no distension.      Palpations: Abdomen is soft. There is no mass.     Tenderness: There is no abdominal tenderness. There is no guarding or rebound.  Musculoskeletal:        General: No tenderness. Normal range of motion.     Cervical back: Normal range of motion.  Lymphadenopathy:     Cervical: No cervical adenopathy.  Skin:    General: Skin is warm and dry.     Findings: No rash.  Neurological:     Mental Status: He is alert and oriented to person, place, and time.     Cranial Nerves: No cranial nerve deficit.     Motor: No abnormal muscle tone.     Coordination: Coordination normal.     Gait: Gait normal.     Deep Tendon Reflexes: Reflexes are normal and symmetric.  Psychiatric:        Behavior: Behavior normal.        Thought Content: Thought content normal.        Judgment: Judgment normal.   L elbow - painful Rectal - ?pain   I spent 22 minutes in addition to time for CPX wellness examination in preparing to see the patient by review of recent labs, imaging and procedures, obtaining and reviewing separately obtained history, communicating with the patient, ordering medications, tests or procedures, and documenting clinical information in the EHR including the differential diagnosis, treatment, and any further evaluation and other management of fecal smearing, tennis elbow         Lab Results  Component Value Date   WBC 4.7 05/21/2020   HGB 15.3 05/21/2020   HCT 44.3 05/21/2020   PLT 207.0 05/21/2020   GLUCOSE 96 05/21/2020   CHOL 196 04/13/2020   TRIG 189.0 (H) 04/13/2020   HDL 40.90 04/13/2020   LDLDIRECT 136.0 04/09/2019   LDLCALC 118 (H) 04/13/2020   ALT 25 05/21/2020   AST 22 05/21/2020   NA 139 05/21/2020   K 4.3 05/21/2020   CL 103 05/21/2020   CREATININE 1.42 05/21/2020   BUN 21 05/21/2020   CO2 30 05/21/2020   TSH 5.44 (H) 04/13/2020   PSA 0.52 04/13/2020   HGBA1C 5.5 10/12/2010    CT CARDIAC SCORING (SELF PAY ONLY)  Addendum Date: 10/05/2020   ADDENDUM REPORT:  10/05/2020 17:41 EXAM: OVER-READ INTERPRETATION  CT CHEST The following report is an over-read performed by radiologist Dr. Nadara Eaton [facility] on 10/05/2020. This over-read does not include interpretation of cardiac or coronary anatomy or pathology. The coronary calcium score interpretation by the cardiologist is attached. COMPARISON:  CT 05/20/2018 FINDINGS:  Visualized liver is slightly heterogeneous and compatible with geographic hepatic steatosis. Findings are similar to the previous examination. Visualized mediastinal structures are normal. 2 mm nodular density along the right minor fissure on sequence 4, image 19. Otherwise, the visualized lungs are clear without pleural effusions or significant consolidation. No acute bone abnormality. IMPRESSION: 1. No acute abnormality involving the extracardiac structures. 2. Indeterminate 2 mm nodule along the right minor fissure. No follow-up needed if patient is low-risk. Non-contrast chest CT can be considered in 12 months if patient is high-risk. This recommendation follows the consensus statement: Guidelines for Management of Incidental Pulmonary Nodules Detected on CT Images: From the Fleischner Society 2017; Radiology 2017; 284:228-243. 3. Visualized liver is slightly heterogeneous. Findings are similar to prior examination and most compatible with hepatic steatosis. Electronically Signed   By: Richarda Overlie M.D.   On: 10/05/2020 17:41   Result Date: 10/05/2020 CLINICAL DATA:  Cardiovascular Disease Risk stratification EXAM: Coronary Calcium Score TECHNIQUE: A gated, non-contrast computed tomography scan of the heart was performed using 63mm slice thickness. Axial images were analyzed on a dedicated workstation. Calcium scoring of the coronary arteries was performed using the Agatston method. FINDINGS: Coronary arteries: Normal origins. Coronary Calcium Score: Left main: 0 Left anterior descending artery: 68.2 Left circumflex artery: 0 Right coronary  artery: 0 Total: 68.2 Percentile: 70th Pericardium: Normal. Ascending Aorta: Normal caliber. Non-cardiac: See separate report from [facility]. IMPRESSION: Coronary calcium score of 68.2. This was 70th percentile for age-, race-, and sex-matched controls. RECOMMENDATIONS: Coronary artery calcium (CAC) score is a strong predictor of incident coronary heart disease (CHD) and provides predictive information beyond traditional risk factors. CAC scoring is reasonable to use in the decision to withhold, postpone, or initiate statin therapy in intermediate-risk or selected borderline-risk asymptomatic adults (age 67-75 years and LDL-C >=70 to <190 mg/dL) who do not have diabetes or established atherosclerotic cardiovascular disease (ASCVD).* In intermediate-risk (10-year ASCVD risk >=7.5% to <20%) adults or selected borderline-risk (10-year ASCVD risk >=5% to <7.5%) adults in whom a CAC score is measured for the purpose of making a treatment decision the following recommendations have been made: If CAC=0, it is reasonable to withhold statin therapy and reassess in 5 to 10 years, as long as higher risk conditions are absent (diabetes mellitus, family history of premature CHD in first degree relatives (males <55 years; females <65 years), cigarette smoking, or LDL >=190 mg/dL). If CAC is 1 to 99, it is reasonable to initiate statin therapy for patients >=40 years of age. If CAC is >=100 or >=75th percentile, it is reasonable to initiate statin therapy at any age. Cardiology referral should be considered for patients with CAC scores >=400 or >=75th percentile. *2018 AHA/ACC/AACVPR/AAPA/ABC/ACPM/ADA/AGS/APhA/ASPC/NLA/PCNA Guideline on the Management of Blood Cholesterol: A Report of the American College of Cardiology/American Heart Association Task Force on Clinical Practice Guidelines. J Am Coll Cardiol. 2019;73(24):3168-3209. Armanda Magic, MD Electronically Signed: By: Armanda Magic M.D. On: 10/05/2020 16:05     Assessment & Plan:   Problem List Items Addressed This Visit     Stool incontinence    C/o stool leakage x 8 - 9 months Pt had (-) Cologuard in 2020 Start Metamucil GI ref Triamc oint externally prn. Supp offered      Relevant Orders   Ambulatory referral to Gastroenterology   DG Abd 2 Views   Tennis elbow    New Use theraband      Well adult exam - Primary    We discussed age appropriate  health related issues, including available/recomended screening tests and vaccinations. We discussed a need for adhering to healthy diet and exercise. Labs were ordered to be later reviewed . All questions were answered. 2022 CT Coronary calcium score of 68.2. Shingrix info Cologuard (-) 2020      Relevant Orders   TSH   Urinalysis   CBC with Differential/Platelet   Lipid panel   PSA   Comprehensive metabolic panel      Meds ordered this encounter  Medications   psyllium (METAMUCIL) 0.52 g capsule    Sig: Take 1 capsule (0.52 g total) by mouth daily.    Dispense:  100 capsule    Refill:  11   triamcinolone ointment (KENALOG) 0.1 %    Sig: Apply 1 application topically 2 (two) times daily.    Dispense:  80 g    Refill:  2      Follow-up: Return in about 6 months (around 10/16/2021) for a follow-up visit.  Walker Kehr, MD

## 2021-04-18 NOTE — Addendum Note (Signed)
Addended by: Marijean Heath R on: 04/18/2021 03:50 PM   Modules accepted: Orders

## 2021-04-18 NOTE — Assessment & Plan Note (Signed)
New Use theraband

## 2021-04-18 NOTE — Assessment & Plan Note (Signed)
We discussed age appropriate health related issues, including available/recomended screening tests and vaccinations. We discussed a need for adhering to healthy diet and exercise. Labs were ordered to be later reviewed . All questions were answered. 2022 CT Coronary calcium score of 68.2. Shingrix info Cologuard (-) 2020

## 2021-04-18 NOTE — Patient Instructions (Addendum)
Theraband Ice Band

## 2021-04-19 LAB — TSH: TSH: 3.78 u[IU]/mL (ref 0.35–5.50)

## 2021-04-19 LAB — PSA: PSA: 0.61 ng/mL (ref 0.10–4.00)

## 2021-04-24 ENCOUNTER — Other Ambulatory Visit: Payer: Self-pay | Admitting: Internal Medicine

## 2021-05-17 ENCOUNTER — Other Ambulatory Visit: Payer: Self-pay | Admitting: Internal Medicine

## 2021-05-25 ENCOUNTER — Other Ambulatory Visit: Payer: Self-pay | Admitting: Internal Medicine

## 2021-10-20 ENCOUNTER — Encounter: Payer: Self-pay | Admitting: Internal Medicine

## 2021-10-20 ENCOUNTER — Ambulatory Visit: Payer: BC Managed Care – PPO | Admitting: Internal Medicine

## 2021-10-20 VITALS — BP 110/76 | HR 68 | Temp 98.1°F | Ht 65.0 in | Wt 167.2 lb

## 2021-10-20 DIAGNOSIS — Z0000 Encounter for general adult medical examination without abnormal findings: Secondary | ICD-10-CM

## 2021-10-20 DIAGNOSIS — G4700 Insomnia, unspecified: Secondary | ICD-10-CM | POA: Diagnosis not present

## 2021-10-20 DIAGNOSIS — Z23 Encounter for immunization: Secondary | ICD-10-CM | POA: Diagnosis not present

## 2021-10-20 DIAGNOSIS — R197 Diarrhea, unspecified: Secondary | ICD-10-CM

## 2021-10-20 DIAGNOSIS — F329 Major depressive disorder, single episode, unspecified: Secondary | ICD-10-CM | POA: Diagnosis not present

## 2021-10-20 DIAGNOSIS — E785 Hyperlipidemia, unspecified: Secondary | ICD-10-CM

## 2021-10-20 DIAGNOSIS — M7712 Lateral epicondylitis, left elbow: Secondary | ICD-10-CM

## 2021-10-20 NOTE — Assessment & Plan Note (Signed)
Cont on Lexapro 

## 2021-10-20 NOTE — Progress Notes (Signed)
Subjective:  Patient ID: Kyle Griffin., male    DOB: [birth year]  Age: 56 y.o. MRN: [id number]  CC: Follow-up (6 month f/u- Need 2nd shingrx)   HPI Kyle Griffin. presents for diarrhea, tennis elbow, depression f/u  Outpatient Medications Prior to Visit  Medication Sig Dispense Refill   aspirin EC 81 MG tablet Take 81 mg by mouth daily.     Cholecalciferol (VITAMIN D3) 50 MCG (2000 UT) capsule Take 1 capsule (2,000 Units total) by mouth daily. 100 capsule 3   escitalopram (LEXAPRO) 10 MG tablet TAKE ONE TABLET BY MOUTH ONE TIME DAILY 90 tablet 1   famotidine (PEPCID) 20 MG tablet Take 1 tablet (20 mg total) by mouth daily. ulcers (Patient taking differently: Take 20 mg by mouth as needed for heartburn. ulcers) 30 tablet 2   fluticasone (FLONASE) 50 MCG/ACT nasal spray Place 2 sprays into both nostrils daily. 16 g 6   indomethacin (INDOCIN) 50 MG capsule Take 1 capsule (50 mg total) by mouth 3 (three) times daily as needed for moderate pain. 30 capsule 0   loratadine (CLARITIN) 10 MG tablet Take 1 tablet (10 mg total) by mouth daily. 90 tablet 3   Potassium Citrate 15 MEQ (1620 MG) TBCR TAKE ONE TABLET BY MOUTH TWICE DAILY 180 tablet 0   psyllium (METAMUCIL) 0.52 g capsule Take 1 capsule (0.52 g total) by mouth daily. 100 capsule 11   rosuvastatin (CRESTOR) 5 MG tablet TAKE ONE TABLET BY MOUTH ONE TIME DAILY 90 tablet 2   tamsulosin (FLOMAX) 0.4 MG CAPS capsule Take 1 capsule (0.4 mg total) by mouth daily. 90 capsule 3   triamcinolone ointment (KENALOG) 0.1 % Apply 1 application topically 2 (two) times daily. 80 g 2   zolpidem (AMBIEN) 10 MG tablet Take 1 tablet (10 mg total) by mouth at bedtime as needed for sleep. 90 tablet 1   No facility-administered medications prior to visit.    ROS: Review of Systems  Constitutional:  Negative for appetite change, fatigue and unexpected weight change.  HENT:  Negative for congestion, nosebleeds, sneezing, sore throat and trouble  swallowing.   Eyes:  Negative for itching and visual disturbance.  Respiratory:  Negative for cough.   Cardiovascular:  Negative for chest pain, palpitations and leg swelling.  Gastrointestinal:  Negative for abdominal distention, blood in stool, diarrhea and nausea.  Genitourinary:  Negative for frequency and hematuria.  Musculoskeletal:  Positive for arthralgias. Negative for back pain, gait problem, joint swelling and neck pain.  Skin:  Negative for rash.  Neurological:  Negative for dizziness, tremors, speech difficulty and weakness.  Psychiatric/Behavioral:  Negative for agitation, dysphoric mood and sleep disturbance. The patient is not nervous/anxious.     Objective:  BP 110/76 (BP Location: Left Arm)   Pulse 68   Temp 98.1 F (36.7 C) (Oral)   Ht 5\' 5"  (1.651 m)   Wt 167 lb 3.2 oz (75.8 kg)   SpO2 96%   BMI 27.82 kg/m   BP Readings from Last 3 Encounters:  10/20/21 110/76  04/18/21 122/78  09/08/20 128/82    Wt Readings from Last 3 Encounters:  10/20/21 167 lb 3.2 oz (75.8 kg)  04/18/21 175 lb (79.4 kg)  08/24/20 175 lb (79.4 kg)    Physical Exam Constitutional:      General: He is not in acute distress.    Appearance: Normal appearance. He is well-developed.     Comments: NAD  Eyes:     Conjunctiva/sclera:  Conjunctivae normal.     Pupils: Pupils are equal, round, and reactive to light.  Neck:     Thyroid: No thyromegaly.     Vascular: No JVD.  Cardiovascular:     Rate and Rhythm: Normal rate and regular rhythm.     Heart sounds: Normal heart sounds. No murmur heard.    No friction rub. No gallop.  Pulmonary:     Effort: Pulmonary effort is normal. No respiratory distress.     Breath sounds: Normal breath sounds. No wheezing or rales.  Chest:     Chest wall: No tenderness.  Abdominal:     General: Bowel sounds are normal. There is no distension.     Palpations: Abdomen is soft. There is no mass.     Tenderness: There is no abdominal tenderness. There  is no guarding or rebound.  Musculoskeletal:        General: No tenderness. Normal range of motion.     Cervical back: Normal range of motion.  Lymphadenopathy:     Cervical: No cervical adenopathy.  Skin:    General: Skin is warm and dry.     Findings: No rash.  Neurological:     Mental Status: He is alert and oriented to person, place, and time.     Cranial Nerves: No cranial nerve deficit.     Motor: No abnormal muscle tone.     Coordination: Coordination normal.     Gait: Gait normal.     Deep Tendon Reflexes: Reflexes are normal and symmetric.  Psychiatric:        Behavior: Behavior normal.        Thought Content: Thought content normal.        Judgment: Judgment normal.   Fingers with OA  Lab Results  Component Value Date   WBC 4.4 04/18/2021   HGB 14.0 04/18/2021   HCT 40.9 04/18/2021   PLT 206.0 04/18/2021   GLUCOSE 98 04/18/2021   CHOL 180 04/18/2021   TRIG 191.0 (H) 04/18/2021   HDL 41.90 04/18/2021   LDLDIRECT 136.0 04/09/2019   LDLCALC 100 (H) 04/18/2021   ALT 29 04/18/2021   AST 36 04/18/2021   NA 141 04/18/2021   K 4.2 04/18/2021   CL 106 04/18/2021   CREATININE 1.29 04/18/2021   BUN 22 04/18/2021   CO2 29 04/18/2021   TSH 3.78 04/18/2021   PSA 0.61 04/18/2021   HGBA1C 5.5 10/12/2010    CT CARDIAC SCORING (SELF PAY ONLY)  Addendum Date: 10/05/2020   ADDENDUM REPORT: 10/05/2020 17:41 EXAM: OVER-READ INTERPRETATION  CT CHEST The following report is an over-read performed by radiologist Dr. Nadara Eaton [facility] on 10/05/2020. This over-read does not include interpretation of cardiac or coronary anatomy or pathology. The coronary calcium score interpretation by the cardiologist is attached. COMPARISON:  CT 05/20/2018 FINDINGS: Visualized liver is slightly heterogeneous and compatible with geographic hepatic steatosis. Findings are similar to the previous examination. Visualized mediastinal structures are normal. 2 mm nodular density along the  right minor fissure on sequence 4, image 19. Otherwise, the visualized lungs are clear without pleural effusions or significant consolidation. No acute bone abnormality. IMPRESSION: 1. No acute abnormality involving the extracardiac structures. 2. Indeterminate 2 mm nodule along the right minor fissure. No follow-up needed if patient is low-risk. Non-contrast chest CT can be considered in 12 months if patient is high-risk. This recommendation follows the consensus statement: Guidelines for Management of Incidental Pulmonary Nodules Detected on CT Images: From the Fleischner Society  2017; Radiology 2017; 284:228-243. 3. Visualized liver is slightly heterogeneous. Findings are similar to prior examination and most compatible with hepatic steatosis. Electronically Signed   By: Richarda Overlie M.D.   On: 10/05/2020 17:41   Result Date: 10/05/2020 CLINICAL DATA:  Cardiovascular Disease Risk stratification EXAM: Coronary Calcium Score TECHNIQUE: A gated, non-contrast computed tomography scan of the heart was performed using 72mm slice thickness. Axial images were analyzed on a dedicated workstation. Calcium scoring of the coronary arteries was performed using the Agatston method. FINDINGS: Coronary arteries: Normal origins. Coronary Calcium Score: Left main: 0 Left anterior descending artery: 68.2 Left circumflex artery: 0 Right coronary artery: 0 Total: 68.2 Percentile: 70th Pericardium: Normal. Ascending Aorta: Normal caliber. Non-cardiac: See separate report from [facility]. IMPRESSION: Coronary calcium score of 68.2. This was 70th percentile for age-, race-, and sex-matched controls. RECOMMENDATIONS: Coronary artery calcium (CAC) score is a strong predictor of incident coronary heart disease (CHD) and provides predictive information beyond traditional risk factors. CAC scoring is reasonable to use in the decision to withhold, postpone, or initiate statin therapy in intermediate-risk or selected borderline-risk  asymptomatic adults (age 54-75 years and LDL-C >=70 to <190 mg/dL) who do not have diabetes or established atherosclerotic cardiovascular disease (ASCVD).* In intermediate-risk (10-year ASCVD risk >=7.5% to <20%) adults or selected borderline-risk (10-year ASCVD risk >=5% to <7.5%) adults in whom a CAC score is measured for the purpose of making a treatment decision the following recommendations have been made: If CAC=0, it is reasonable to withhold statin therapy and reassess in 5 to 10 years, as long as higher risk conditions are absent (diabetes mellitus, family history of premature CHD in first degree relatives (males <55 years; females <65 years), cigarette smoking, or LDL >=190 mg/dL). If CAC is 1 to 99, it is reasonable to initiate statin therapy for patients >=38 years of age. If CAC is >=100 or >=75th percentile, it is reasonable to initiate statin therapy at any age. Cardiology referral should be considered for patients with CAC scores >=400 or >=75th percentile. *2018 AHA/ACC/AACVPR/AAPA/ABC/ACPM/ADA/AGS/APhA/ASPC/NLA/PCNA Guideline on the Management of Blood Cholesterol: A Report of the American College of Cardiology/American Heart Association Task Force on Clinical Practice Guidelines. J Am Coll Cardiol. 2019;73(24):3168-3209. Armanda Magic, MD Electronically Signed: By: Armanda Magic M.D. On: 10/05/2020 16:05    Assessment & Plan:   Problem List Items Addressed This Visit     Diarrhea    Problem stopped after he stopped eating out a lot      Dyslipidemia    On Crestor      Insomnia    Use zolpidem as needed  Potential benefits of a long term prn benzodiazepines  use as well as potential risks  and complications were explained to the patient and were aknowledged.      Reactive depression (situational)      Cont on Lexapro      Tennis elbow    Blue-Emu cream use 2-3 times a day      Well adult exam - Primary   Relevant Orders   TSH   Urinalysis   CBC with  Differential/Platelet   Lipid panel   Comprehensive metabolic panel      No orders of the defined types were placed in this encounter.     Follow-up: Return in about 6 months (around 04/20/2022) for Wellness Exam.  Sonda Primes, MD

## 2021-10-20 NOTE — Assessment & Plan Note (Signed)
Problem stopped after he stopped eating out a lot

## 2021-10-20 NOTE — Assessment & Plan Note (Signed)
Use zolpidem as needed  Potential benefits of a long term prn benzodiazepines  use as well as potential risks  and complications were explained to the patient and were aknowledged.

## 2021-10-20 NOTE — Assessment & Plan Note (Signed)
On Crestor 

## 2021-10-20 NOTE — Patient Instructions (Addendum)
Blue-Emu cream -- use 2-3 times a day ? ?

## 2021-10-20 NOTE — Assessment & Plan Note (Signed)
Blue-Emu cream -- use 2-3 times a day ? ?

## 2021-10-31 ENCOUNTER — Other Ambulatory Visit: Payer: Self-pay | Admitting: Internal Medicine

## 2022-01-03 DIAGNOSIS — T1501XA Foreign body in cornea, right eye, initial encounter: Secondary | ICD-10-CM | POA: Diagnosis not present

## 2022-01-03 DIAGNOSIS — H5711 Ocular pain, right eye: Secondary | ICD-10-CM | POA: Diagnosis not present

## 2022-01-05 DIAGNOSIS — T1501XD Foreign body in cornea, right eye, subsequent encounter: Secondary | ICD-10-CM | POA: Diagnosis not present

## 2022-02-06 ENCOUNTER — Encounter: Payer: Self-pay | Admitting: Nurse Practitioner

## 2022-02-06 ENCOUNTER — Ambulatory Visit: Payer: BC Managed Care – PPO | Admitting: Nurse Practitioner

## 2022-02-06 VITALS — BP 122/78 | HR 65 | Temp 97.9°F | Ht 65.0 in | Wt 162.8 lb

## 2022-02-06 DIAGNOSIS — R197 Diarrhea, unspecified: Secondary | ICD-10-CM

## 2022-02-06 NOTE — Patient Instructions (Signed)
Use peptobismol as directed in package Ok to add curturelle or florastor probiotics Maintain Bland diet Avoid diary, greasy or spicy foods. Maintain adequate oral hydration Go to lab for stool kit  Bland Diet A bland diet may consist of soft foods or foods that are not high in fat or are not greasy, acidic, or spicy. Avoiding certain foods may cause less irritation to your mouth, throat, stomach, or gastrointestinal tract. Avoiding certain foods may make you feel better. Everyone's tolerances are different. A bland diet should be based on what you can tolerate and what may cause discomfort. What is my plan? Your health care provider or dietitian may recommend specific changes to your diet to treat your symptoms. These changes may include: Eating small meals frequently. Cooking food until it is soft enough to chew easily. Taking the time to chew your food thoroughly, so it is easy to swallow and digest. Avoiding foods that cause you discomfort. These may include spicy food, fried food, greasy foods, hard-to-chew foods, or citrus fruits and juices. Drinking slowly. What are tips for following this plan? Reading food labels To reduce fiber intake, look for food labels that say "whole," such as whole wheat or whole grain. Shopping Avoid food items that may have nuts or seeds. Avoid vegetables that may make you gassy or have a tough texture, such as broccoli, cauliflower, or corn. Cooking Cook foods thoroughly so they have a soft texture. Meal planning Make sure you include foods from all food groups to eat a balanced diet. Eat a variety of types of foods. Eat foods and drink beverages that do not cause you discomfort. These may include soups and broths with cooked meats, pasta, and vegetables. Lifestyle Sit up after meals, avoid tight clothing, and take time to eat and chew your food slowly. Ask your health care provider whether you should take dietary supplements. General  information Mildly season your foods. Some seasonings, such as cayenne pepper, vinegar, or hot sauce, may cause irritation. The foods, beverages, or seasonings to avoid should be based on individual tolerance. What foods should I eat? Fruits Canned or cooked fruit such as peaches, pears, or applesauce. Bananas. Vegetables Well-cooked vegetables. Canned or cooked vegetables such as carrots, green beans, beets, or spinach. Mashed or boiled potatoes. Grains  Hot cereals, such as cream of wheat and processed oatmeal. Rice. Bread, crackers, pasta, or tortillas made from refined white flour. Meats and other proteins  Eggs. Creamy peanut butter or other nut butters. Lean, well-cooked tender meats, such as beef, pork, chicken, or fish. Dairy Low-fat dairy products such as milk, cottage cheese, or yogurt. Beverages  Water. Herbal tea. Apple juice. Fats and oils Mild salad dressings. Canola or olive oil. Sweets and desserts Low-fat pudding, custard, or ice cream. Fruit gelatin. The items listed above may not be a complete list of foods and beverages you can eat. Contact a dietitian for more information. What foods should I avoid? Fruits Citrus fruits, such as oranges and grapefruit. Fruits with a stringy texture. Fruits that have lots of seeds, such as kiwi or strawberries. Dried fruits. Vegetables Raw, uncooked vegetables. Salads. Grains Whole grain breads, muffins, and cereals. Meats and other proteins Tough, fibrous meats. Highly seasoned meat such as corned beef, smoked meats, or fish. Processed high-fat meats such as brats, hot dogs, or sausage. Dairy Full-fat dairy foods such as ice cream and cheese. Beverages Caffeinated drinks. Alcohol. Seasonings and condiments Strongly flavored seasonings or condiments. Hot sauce. Salsa. Other foods Spicy foods. Maceo Pro  or greasy foods. Sour foods, such as pickled or fermented foods like sauerkraut. Foods high in fiber. The items listed above  may not be a complete list of foods and beverages you should avoid. Contact a dietitian for more information. Summary A bland diet should be based on individual tolerance. It may consist of foods that are soft textured and do not have a lot of fat, fiber, acid, or seasonings. A bland diet may be recommended because avoiding certain foods, beverages, or spices may make you feel better. This information is not intended to replace advice given to you by your health care provider. Make sure you discuss any questions you have with your health care provider. Document Revised: 12/27/2020 Document Reviewed: 12/27/2020 Elsevier Patient Education  [employer].

## 2022-02-06 NOTE — Progress Notes (Signed)
Established Patient Visit  Patient: Kyle Griffin.   DOB: 1965/04/10   56 y.o. Male  MRN: [id number] Visit Date: 02/06/2022  Subjective:    Chief Complaint  Patient presents with   Acute Visit    C/o diarrhea x 5 days     HPI Diarrhea Onset 5days ago, he is unsure if related to food (ate spaghetti-lunch and hot dog-dinner). Denies use of oral abx in last 1week. Described stool as watery, had 8 stools yesterday. No nausea or vomiting or fever or ABD pain or blood in stool, no rectal pain, no urinary symptoms, no URI symptoms. Last colonoscopy  2003: normal, no diverticulosis CT renal done 2019: no diverticulosis noted Cologuard done 2020: negative.  Advised to maintain bland diet and adequate oral hydration. Use peptobismol or imodium to decreased bowel frequency Start oral probiotics till symptoms resolve. Ordered GI pathogen panel  Reviewed medical, surgical, and social history today  Medications: Outpatient Medications Prior to Visit  Medication Sig   aspirin EC 81 MG tablet Take 81 mg by mouth daily.   Cholecalciferol (VITAMIN D3) 50 MCG (2000 UT) capsule Take 1 capsule (2,000 Units total) by mouth daily.   escitalopram (LEXAPRO) 10 MG tablet TAKE ONE TABLET BY MOUTH ONE TIME DAILY   famotidine (PEPCID) 20 MG tablet Take 1 tablet (20 mg total) by mouth daily. ulcers (Patient taking differently: Take 20 mg by mouth as needed for heartburn. ulcers)   fluticasone (FLONASE) 50 MCG/ACT nasal spray Place 2 sprays into both nostrils daily.   loratadine (CLARITIN) 10 MG tablet Take 1 tablet (10 mg total) by mouth daily.   Potassium Citrate 15 MEQ (1620 MG) TBCR TAKE ONE TABLET BY MOUTH TWICE DAILY   psyllium (METAMUCIL) 0.52 g capsule Take 1 capsule (0.52 g total) by mouth daily.   rosuvastatin (CRESTOR) 5 MG tablet TAKE ONE TABLET BY MOUTH ONE TIME DAILY   tamsulosin (FLOMAX) 0.4 MG CAPS capsule Take 1 capsule (0.4 mg total) by mouth daily.   triamcinolone  ointment (KENALOG) 0.1 % Apply 1 application topically 2 (two) times daily.   zolpidem (AMBIEN) 10 MG tablet Take 1 tablet (10 mg total) by mouth at bedtime as needed for sleep.   [DISCONTINUED] indomethacin (INDOCIN) 50 MG capsule Take 1 capsule (50 mg total) by mouth 3 (three) times daily as needed for moderate pain. (Patient not taking: Reported on 02/06/2022)   No facility-administered medications prior to visit.   Reviewed past medical and social history.   ROS per HPI above      Objective:  BP 122/78 (BP Location: Right Arm, Patient Position: Sitting, Cuff Size: Normal)   Pulse 65   Temp 97.9 F (36.6 C) (Temporal)   Ht 5\' 5"  (1.651 m)   Wt 162 lb 12.8 oz (73.8 kg)   SpO2 92%   BMI 27.09 kg/m      Physical Exam Vitals reviewed.  Constitutional:      General: He is not in acute distress. Cardiovascular:     Rate and Rhythm: Normal rate.     Pulses: Normal pulses.  Pulmonary:     Effort: Pulmonary effort is normal.  Abdominal:     General: Bowel sounds are normal. There is no distension.     Palpations: Abdomen is soft.     Tenderness: There is no abdominal tenderness. There is no guarding or rebound.  Neurological:     Mental Status:  He is alert and oriented to person, place, and time.     No results found for any visits on 02/06/22.    Assessment & Plan:    Problem List Items Addressed This Visit       Other   Diarrhea - Primary    Onset 5days ago, he is unsure if related to food (ate spaghetti-lunch and hot dog-dinner). Denies use of oral abx in last 1week. Described stool as watery, had 8 stools yesterday. No nausea or vomiting or fever or ABD pain or blood in stool, no rectal pain, no urinary symptoms, no URI symptoms. Last colonoscopy  2003: normal, no diverticulosis CT renal done 2019: no diverticulosis noted Cologuard done 2020: negative.  Advised to maintain bland diet and adequate oral hydration. Use peptobismol or imodium to decreased bowel  frequency Start oral probiotics till symptoms resolve. Ordered GI pathogen panel       Relevant Orders   GI pathogen panel by PCR, stool   Return if symptoms worsen or fail to improve.     Wilfred Lacy, NP

## 2022-02-06 NOTE — Assessment & Plan Note (Addendum)
Onset 5days ago, he is unsure if related to food (ate spaghetti-lunch and hot dog-dinner). Denies use of oral abx in last 1week. Described stool as watery, had 8 stools yesterday. No nausea or vomiting or fever or ABD pain or blood in stool, no rectal pain, no urinary symptoms, no URI symptoms. Last colonoscopy  2003: normal, no diverticulosis CT renal done 2019: no diverticulosis noted Cologuard done 2020: negative.  Advised to maintain bland diet and adequate oral hydration. Use peptobismol or imodium to decreased bowel frequency Start oral probiotics till symptoms resolve. Ordered GI pathogen panel

## 2022-02-07 ENCOUNTER — Other Ambulatory Visit: Payer: BC Managed Care – PPO

## 2022-02-07 DIAGNOSIS — R197 Diarrhea, unspecified: Secondary | ICD-10-CM

## 2022-02-08 ENCOUNTER — Telehealth: Payer: Self-pay | Admitting: Nurse Practitioner

## 2022-02-08 DIAGNOSIS — R197 Diarrhea, unspecified: Secondary | ICD-10-CM

## 2022-02-08 MED ORDER — DIPHENOXYLATE-ATROPINE 2.5-0.025 MG PO TABS: 1.0000 | ORAL_TABLET | Freq: Four times a day (QID) | ORAL | 0 refills | Status: DC | PRN

## 2022-02-08 NOTE — Telephone Encounter (Signed)
Pt wife wanted to know about the pt lab results. You can call his wife beth [phone]

## 2022-02-09 ENCOUNTER — Encounter: Payer: Self-pay | Admitting: Gastroenterology

## 2022-02-09 ENCOUNTER — Other Ambulatory Visit: Payer: Self-pay | Admitting: Nurse Practitioner

## 2022-02-09 DIAGNOSIS — R197 Diarrhea, unspecified: Secondary | ICD-10-CM

## 2022-02-09 LAB — GASTROINTESTINAL PATHOGEN PNL
CampyloBacter Group: NOT DETECTED
Norovirus GI/GII: NOT DETECTED
Rotavirus A: NOT DETECTED
Salmonella species: NOT DETECTED
Shiga Toxin 1: NOT DETECTED
Shiga Toxin 2: NOT DETECTED
Shigella Species: NOT DETECTED
Vibrio Group: NOT DETECTED
Yersinia enterocolitica: NOT DETECTED

## 2022-03-01 ENCOUNTER — Other Ambulatory Visit: Payer: Self-pay | Admitting: Internal Medicine

## 2022-03-07 ENCOUNTER — Ambulatory Visit: Payer: BC Managed Care – PPO | Admitting: Gastroenterology

## 2022-03-07 DIAGNOSIS — J019 Acute sinusitis, unspecified: Secondary | ICD-10-CM | POA: Diagnosis not present

## 2022-07-22 ENCOUNTER — Encounter (HOSPITAL_BASED_OUTPATIENT_CLINIC_OR_DEPARTMENT_OTHER): Payer: Self-pay | Admitting: Emergency Medicine

## 2022-07-22 ENCOUNTER — Emergency Department (HOSPITAL_BASED_OUTPATIENT_CLINIC_OR_DEPARTMENT_OTHER)
Admission: EM | Admit: 2022-07-22 | Discharge: 2022-07-23 | Disposition: A | Discharge status: Home / Self Care | Payer: BC Managed Care – PPO | Attending: Emergency Medicine | Admitting: Emergency Medicine

## 2022-07-22 DIAGNOSIS — J45909 Unspecified asthma, uncomplicated: Secondary | ICD-10-CM | POA: Insufficient documentation

## 2022-07-22 DIAGNOSIS — Z7982 Long term (current) use of aspirin: Secondary | ICD-10-CM | POA: Insufficient documentation

## 2022-07-22 DIAGNOSIS — S0181XA Laceration without foreign body of other part of head, initial encounter: Secondary | ICD-10-CM | POA: Diagnosis not present

## 2022-07-22 DIAGNOSIS — W228XXA Striking against or struck by other objects, initial encounter: Secondary | ICD-10-CM | POA: Diagnosis not present

## 2022-07-22 DIAGNOSIS — S0121XA Laceration without foreign body of nose, initial encounter: Secondary | ICD-10-CM | POA: Diagnosis not present

## 2022-07-22 DIAGNOSIS — S0083XA Contusion of other part of head, initial encounter: Secondary | ICD-10-CM

## 2022-07-22 DIAGNOSIS — S40212A Abrasion of left shoulder, initial encounter: Secondary | ICD-10-CM | POA: Insufficient documentation

## 2022-07-22 DIAGNOSIS — Z23 Encounter for immunization: Secondary | ICD-10-CM | POA: Insufficient documentation

## 2022-07-22 MED ORDER — LIDOCAINE-EPINEPHRINE (PF) 2 %-1:200000 IJ SOLN
10.0000 mL | Freq: Once | INTRAMUSCULAR | Status: AC
  Administered 2022-07-22: 10 mL via INTRADERMAL
  Filled 2022-07-22: qty 20

## 2022-07-22 MED ORDER — TETANUS-DIPHTH-ACELL PERTUSSIS 5-2.5-18.5 LF-MCG/0.5 IM SUSY
0.5000 mL | PREFILLED_SYRINGE | Freq: Once | INTRAMUSCULAR | Status: AC
  Administered 2022-07-22: 0.5 mL via INTRAMUSCULAR
  Filled 2022-07-22: qty 0.5
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
## 2022-07-23 ENCOUNTER — Emergency Department (HOSPITAL_BASED_OUTPATIENT_CLINIC_OR_DEPARTMENT_OTHER): Payer: BC Managed Care – PPO

## 2022-07-23 DIAGNOSIS — S0993XA Unspecified injury of face, initial encounter: Secondary | ICD-10-CM | POA: Diagnosis not present

## 2022-07-27 DIAGNOSIS — D225 Melanocytic nevi of trunk: Secondary | ICD-10-CM | POA: Diagnosis not present

## 2022-07-27 DIAGNOSIS — L821 Other seborrheic keratosis: Secondary | ICD-10-CM | POA: Diagnosis not present

## 2022-07-27 DIAGNOSIS — D2262 Melanocytic nevi of left upper limb, including shoulder: Secondary | ICD-10-CM | POA: Diagnosis not present

## 2022-07-28 ENCOUNTER — Ambulatory Visit (INDEPENDENT_AMBULATORY_CARE_PROVIDER_SITE_OTHER): Payer: BC Managed Care – PPO | Admitting: Family Medicine

## 2022-07-28 ENCOUNTER — Encounter: Payer: Self-pay | Admitting: Family Medicine

## 2022-07-28 VITALS — BP 120/72 | HR 68 | Temp 98.4°F | Ht 65.0 in | Wt 166.0 lb

## 2022-07-28 DIAGNOSIS — Z4802 Encounter for removal of sutures: Secondary | ICD-10-CM
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
## 2022-08-12 ENCOUNTER — Emergency Department (HOSPITAL_BASED_OUTPATIENT_CLINIC_OR_DEPARTMENT_OTHER)
Admission: EM | Admit: 2022-08-12 | Discharge: 2022-08-12 | Disposition: A | Discharge status: Home / Self Care | Payer: BC Managed Care – PPO | Attending: Emergency Medicine | Admitting: Emergency Medicine

## 2022-08-12 ENCOUNTER — Encounter (HOSPITAL_BASED_OUTPATIENT_CLINIC_OR_DEPARTMENT_OTHER): Payer: Self-pay

## 2022-08-12 ENCOUNTER — Other Ambulatory Visit: Payer: Self-pay

## 2022-08-12 DIAGNOSIS — E860 Dehydration: Secondary | ICD-10-CM | POA: Insufficient documentation

## 2022-08-12 DIAGNOSIS — R42 Dizziness and giddiness: Secondary | ICD-10-CM | POA: Diagnosis not present

## 2022-08-12 DIAGNOSIS — Z7982 Long term (current) use of aspirin: Secondary | ICD-10-CM | POA: Insufficient documentation

## 2022-08-12 DIAGNOSIS — R55 Syncope and collapse: Secondary | ICD-10-CM | POA: Insufficient documentation

## 2022-08-12 LAB — URINALYSIS, ROUTINE W REFLEX MICROSCOPIC
Bilirubin Urine: NEGATIVE
Glucose, UA: NEGATIVE mg/dL
Hgb urine dipstick: NEGATIVE
Ketones, ur: NEGATIVE mg/dL
Leukocytes,Ua: NEGATIVE
Nitrite: NEGATIVE
Protein, ur: NEGATIVE mg/dL
Specific Gravity, Urine: 1.018 (ref 1.005–1.030)
pH: 5.5 (ref 5.0–8.0)

## 2022-08-12 LAB — CBC
HCT: 45.7 % (ref 39.0–52.0)
Hemoglobin: 16 g/dL (ref 13.0–17.0)
MCH: 30.1 pg (ref 26.0–34.0)
MCHC: 35 g/dL (ref 30.0–36.0)
MCV: 86.1 fL (ref 80.0–100.0)
Platelets: 166 10*3/uL (ref 150–400)
RBC: 5.31 MIL/uL (ref 4.22–5.81)
RDW: 12.9 % (ref 11.5–15.5)
WBC: 5 10*3/uL (ref 4.0–10.5)
nRBC: 0 % (ref 0.0–0.2)

## 2022-08-12 LAB — BASIC METABOLIC PANEL
Anion gap: 9 (ref 5–15)
BUN: 19 mg/dL (ref 6–20)
CO2: 25 mmol/L (ref 22–32)
Calcium: 9.8 mg/dL (ref 8.9–10.3)
Chloride: 104 mmol/L (ref 98–111)
Creatinine, Ser: 1.33 mg/dL — ABNORMAL HIGH (ref 0.61–1.24)
GFR, Estimated: >60 mL/min (ref >60)
Glucose, Bld: 107 mg/dL — ABNORMAL HIGH (ref 70–99)
Potassium: 4.4 mmol/L (ref 3.5–5.1)
Sodium: 138 mmol/L (ref 135–145)

## 2022-08-12 LAB — CBG MONITORING, ED: Glucose-Capillary: 116 mg/dL — ABNORMAL HIGH (ref 70–99)

## 2022-08-12 MED ORDER — LACTATED RINGERS IV BOLUS
1000.0000 mL | Freq: Once | INTRAVENOUS | Status: AC
  Administered 2022-08-12: 1000 mL via INTRAVENOUS

## 2022-08-12 MED ORDER — KETOROLAC TROMETHAMINE 15 MG/ML IJ SOLN
15.0000 mg | Freq: Once | INTRAMUSCULAR | Status: DC
  Filled 2022-08-12: qty 1

## 2022-08-12 MED ORDER — ONDANSETRON HCL 4 MG/2ML IJ SOLN: 4.0000 mg | Freq: Once | INTRAMUSCULAR | Status: DC

## 2022-08-12 MED ORDER — KETOROLAC TROMETHAMINE 15 MG/ML IJ SOLN
15.0000 mg | Freq: Once | INTRAMUSCULAR | Status: AC
  Administered 2022-08-12: 15 mg via INTRAVENOUS

## 2022-08-12 NOTE — ED Provider Notes (Signed)
[place] EMERGENCY DEPARTMENT AT [facility] Provider Note   CSN: [id number] Arrival date & time: 08/12/22  1358     History  Chief Complaint  Patient presents with   Dizziness    Kyle Griffin. is a 57 y.o. male with past medical history IBS, hyperlipidemia, Gilberts disease, who presents to ED c/o feeling dizzy and lightheaded today.  He states that he was outside working in the heat this morning for several hours and then he went to the barber to get a haircut.  While he was having his haircut, he began to feel lightheaded like he was going to pass out and nauseated.  States that he did not lose consciousness and did not have chest pain, shortness of breath, focal weakness, vomiting, diarrhea, vision changes, or severe headache.  States that this lasted for about 20 minutes and resolved after eating a protein bar given to him by bystander.  No history of the symptoms.  No fall or head injury.  Came to the ED for further evaluation though he does state that he is overall feeling much better and the lightheadedness has resolved.  He complains of a slight left frontal headache that he states is from not having much to eat today.  Otherwise feeling well.       Home Medications Prior to Admission medications   Medication Sig Start Date End Date Taking? Authorizing Provider  aspirin EC 81 MG tablet Take 81 mg by mouth daily.    [provider]  Cholecalciferol (VITAMIN D3) 50 MCG (2000 UT) capsule Take 1 capsule (2,000 Units total) by mouth daily. 04/09/19   Plotnikov, Georgina Quint, MD  diphenoxylate-atropine (LOMOTIL) 2.5-0.025 MG tablet Take 1 tablet by mouth 4 (four) times daily as needed for diarrhea or loose stools. 02/08/22   Nche, Bonna Gains, NP  escitalopram (LEXAPRO) 10 MG tablet TAKE ONE TABLET BY MOUTH ONE TIME DAILY 11/01/21   Plotnikov, Georgina Quint, MD  famotidine (PEPCID) 20 MG tablet Take 1 tablet (20 mg total) by mouth daily. ulcers Patient taking  differently: Take 20 mg by mouth as needed for heartburn. ulcers 11/17/15   Plotnikov, Georgina Quint, MD  fluticasone (FLONASE) 50 MCG/ACT nasal spray Place 2 sprays into both nostrils daily. 03/10/20   Junie Spencer, FNP  loratadine (CLARITIN) 10 MG tablet Take 1 tablet (10 mg total) by mouth daily. 04/13/20   Plotnikov, Georgina Quint, MD  Potassium Citrate 15 MEQ (1620 MG) TBCR TAKE ONE TABLET BY MOUTH TWICE DAILY 11/01/21   Plotnikov, Georgina Quint, MD  rosuvastatin (CRESTOR) 5 MG tablet TAKE ONE TABLET BY MOUTH ONE TIME DAILY 03/02/22   Plotnikov, Georgina Quint, MD  tamsulosin (FLOMAX) 0.4 MG CAPS capsule Take 1 capsule (0.4 mg total) by mouth daily. 04/09/19   Plotnikov, Georgina Quint, MD  triamcinolone ointment (KENALOG) 0.1 % Apply 1 application topically 2 (two) times daily. 04/18/21   Plotnikov, Georgina Quint, MD  zolpidem (AMBIEN) 10 MG tablet Take 1 tablet (10 mg total) by mouth at bedtime as needed for sleep. 04/13/20   Plotnikov, Georgina Quint, MD      Allergies    Oxycodone-acetaminophen    Review of Systems   Review of Systems  All other systems reviewed and are negative.   Physical Exam Updated Vital Signs BP 121/80   Pulse 63   Temp 98 F (36.7 C) (Oral)   Resp 20   Ht 5\' 5"  (1.651 m)   Wt 75.3 kg   SpO2 97%  BMI 27.62 kg/m  Physical Exam Vitals and nursing note reviewed.  Constitutional:      General: He is not in acute distress.    Appearance: Normal appearance. He is not ill-appearing or toxic-appearing.  HENT:     Head: Normocephalic and atraumatic.     Mouth/Throat:     Mouth: Mucous membranes are dry.  Eyes:     General: No scleral icterus.    Extraocular Movements: Extraocular movements intact.     Conjunctiva/sclera: Conjunctivae normal.  Cardiovascular:     Rate and Rhythm: Normal rate and regular rhythm.     Heart sounds: No murmur heard. Pulmonary:     Effort: Pulmonary effort is normal. No respiratory distress.     Breath sounds: Normal breath sounds. No stridor. No  wheezing, rhonchi or rales.  Abdominal:     General: Abdomen is flat. There is no distension.     Palpations: Abdomen is soft.     Tenderness: There is no abdominal tenderness. There is no right CVA tenderness, left CVA tenderness, guarding or rebound.  Musculoskeletal:        General: No deformity. Normal range of motion.     Cervical back: Normal range of motion and neck supple. No rigidity.     Right lower leg: No edema.     Left lower leg: No edema.     Comments: No midline CTL spinal tenderness, step-offs, or deformities, 5/5 strength to the bilateral upper and lower extremities  Skin:    General: Skin is warm and dry.     Capillary Refill: Capillary refill takes less than 2 seconds.  Neurological:     General: No focal deficit present.     Mental Status: He is alert and oriented to person, place, and time.     GCS: GCS eye subscore is 4. GCS verbal subscore is 5. GCS motor subscore is 6.     Cranial Nerves: Cranial nerves 2-12 are intact. No cranial nerve deficit, dysarthria or facial asymmetry.     Sensory: Sensation is intact.     Motor: Motor function is intact. No weakness, tremor, atrophy or seizure activity.  Psychiatric:        Mood and Affect: Mood normal.        Behavior: Behavior normal.     ED Results / Procedures / Treatments   Labs (all labs ordered are listed, but only abnormal results are displayed) Labs Reviewed  BASIC METABOLIC PANEL - Abnormal; Notable for the following components:      Result Value   Glucose, Bld 107 (*)    Creatinine, Ser 1.33 (*)    All other components within normal limits  CBG MONITORING, ED - Abnormal; Notable for the following components:   Glucose-Capillary 116 (*)    All other components within normal limits  CBC  URINALYSIS, ROUTINE W REFLEX MICROSCOPIC    EKG Sinus bradycardia at 58 bpm, no acute ST-T changes, no STEMI  Radiology No results found.  Procedures Procedures    Medications Ordered in ED Medications   ondansetron (ZOFRAN) injection 4 mg (4 mg Intravenous Not Given 08/12/22 2003)  ketorolac (TORADOL) 15 MG/ML injection 15 mg (15 mg Intravenous Given 08/12/22 2002)  lactated ringers bolus 1,000 mL (1,000 mLs Intravenous New Bag/Given 08/12/22 2001)    ED Course/ Medical Decision Making/ A&P  Medical Decision Making Amount and/or Complexity of Data Reviewed Labs: ordered. Decision-making details documented in ED Course.   Medical Decision Making:   Kyle Griffin. is a 57 y.o. male who presented to the ED today with dizziness detailed above.    Additional history discussed with patient's family/caregivers.  Complete initial physical exam performed, notably the patient was in NAD. Neurologically intact. Slightly dry mucus membranes.    Reviewed and confirmed nursing documentation for past medical history, family history, social history.    Initial Assessment:   With the patient's presentation, differential diagnosis includes but is not limited to BPPV, vestibular migraine, head trauma, AVM, intracranial tumor, multiple sclerosis, drug-related, CVA, vasovagal syncope, orthostatic hypotension, sepsis, hypoglycemia, electrolyte disturbance, anemia, anxiety/panic attack  This is most consistent with an acute complicated illness  Initial Plan:  Screening labs including CBC and Metabolic panel to evaluate for infectious or metabolic etiology of disease.  Urinalysis with reflex culture ordered to evaluate for UTI or relevant urologic/nephrologic pathology.  EKG to evaluate for cardiac pathology Objective evaluation as below reviewed   Initial Study Results:   Laboratory  All laboratory results reviewed without evidence of clinically relevant pathology.   Exceptions include: Creatinine 1.33 similar to baseline   EKG EKG was reviewed independently. Sinus brady. ST segments without concerns for elevations.     Final Assessment and Plan:   57 year old male  presents to the ED complaining of a 20-minute episode of lightheadedness and near syncope that occurred after he is pressure this morning.  On exam, patient is neurologically intact.  Symptoms have fully resolved apart from mild headache.  No associated chest pain or shortness of breath.  Patient well-appearing.  Nonfocal neuroexam.  He does appear slightly dehydrated.  Urine negative.  No significant electrolyte disturbance.  Creatinine similar to baseline.  No AKI.  No leukocytosis.  Hemoglobin normal.  Overall reassuring workup.  Discussed risk/benefits of CT scan with patient and he is agreeable to forego this at this time which I do believe is reasonable considering reassuring exam improved symptoms.  Offered IV hydration as patient did appear dehydrated but she initially declined but then changed his mind and we will proceed with.  Will give symptomatic treatment for headache and IV hydration.  With reassuring labs, do believe that patient is stable for outpatient follow-up and discharge home today.  Will have him closely follow-up with his primary care.  Aware that if he continues to have any recurrent similar episodes he should closely follow-up or return to the ED for reevaluation as he may require further workup.  Patient and wife at bedside agreeable to do so.  Strict ED return precautions given, all questions answered, and stable for discharge.   Clinical Impression:  1. Near syncope   2. Dizziness   3. Dehydration      Discharge           Final Clinical Impression(s) / ED Diagnoses Final diagnoses:  Near syncope  Dizziness  Dehydration    Rx / DC Orders ED Discharge Orders     None         Kyle Lederer, PA-C 08/12/22 2013    Franne Forts, DO 08/14/22 1415

## 2022-08-12 NOTE — ED Triage Notes (Signed)
He states that ~ 1 hour p.t.a. while "getting a hair cut at my barber", he felt "suddenly nauseated and dizzy". He states he feels "better" now and he denies l.o.c., nor any pain or discomfort. He is alert and oriented x 4 with clear speech. His wife is with him.

## 2022-08-12 NOTE — Discharge Instructions (Signed)
Thank you for letting us take care of you today.  Your labs were reassuring. You did appear dehydrated so we gave you fluids to help with this. I believe this in combination with heat exposure contributed to the episode you had today. Please keep well hydrated at home. Follow up with your PCP next week for re-evaluation. If you have more of these episodes, I recommend discussing this with PCP or returning to ED as you may require further cardiac workup. If you develop any new or worsening symptoms such as chest pain, shortness of breath, loss of consciousness, head injury, weakness on one side of your body, or other new, concerning symptoms, please return to nearest ED for re-evaluation.

## 2022-08-12 NOTE — ED Notes (Signed)
Pt had only a donut to eat today (along with a coke); has been working outside today and yesterday. He was given a granola bar when the incident happened at the barber shop and felt better afterward. Pt denies hx of the same. Pt reports all symptoms resolved, left with "slight headache" and fatigue.

## 2022-09-14 ENCOUNTER — Ambulatory Visit (INDEPENDENT_AMBULATORY_CARE_PROVIDER_SITE_OTHER): Payer: BC Managed Care – PPO | Admitting: Internal Medicine

## 2022-09-14 ENCOUNTER — Encounter: Payer: Self-pay | Admitting: Internal Medicine

## 2022-09-14 VITALS — BP 110/70 | HR 60 | Temp 97.9°F | Ht 65.0 in | Wt 171.0 lb

## 2022-09-14 DIAGNOSIS — Z0000 Encounter for general adult medical examination without abnormal findings: Secondary | ICD-10-CM

## 2022-09-14 DIAGNOSIS — R739 Hyperglycemia, unspecified: Secondary | ICD-10-CM | POA: Diagnosis not present

## 2022-09-14 DIAGNOSIS — Z1211 Encounter for screening for malignant neoplasm of colon: Secondary | ICD-10-CM | POA: Diagnosis not present

## 2022-09-14 MED ORDER — ZOLPIDEM TARTRATE 10 MG PO TABS
10.0000 mg | ORAL_TABLET | Freq: Every evening | ORAL | 1 refills | Status: DC | PRN
Start: 2022-09-14 — End: 2023-11-26

## 2022-09-14 NOTE — Assessment & Plan Note (Signed)
We discussed age appropriate health related issues, including available/recomended screening tests and vaccinations. We discussed a need for adhering to healthy diet and exercise. Labs were ordered to be later reviewed . All questions were answered. 2022 CT Coronary calcium score of 68.2. Shingrix info Cologuard (-) 2020

## 2022-09-14 NOTE — Progress Notes (Signed)
Subjective:  Patient ID: Kyle Matos., male    DOB: May 20, 1965  Age: 57 y.o. MRN: [id number]  CC: Annual Exam   HPI Kyle Griffin. presents for a well exam  Outpatient Medications Prior to Visit  Medication Sig Dispense Refill   aspirin EC 81 MG tablet Take 81 mg by mouth daily.     Cholecalciferol (VITAMIN D3) 50 MCG (2000 UT) capsule Take 1 capsule (2,000 Units total) by mouth daily. 100 capsule 3   escitalopram (LEXAPRO) 10 MG tablet TAKE ONE TABLET BY MOUTH ONE TIME DAILY 90 tablet 3   famotidine (PEPCID) 20 MG tablet Take 1 tablet (20 mg total) by mouth daily. ulcers (Patient taking differently: Take 20 mg by mouth as needed for heartburn. ulcers) 30 tablet 2   fluticasone (FLONASE) 50 MCG/ACT nasal spray Place 2 sprays into both nostrils daily. 16 g 6   loratadine (CLARITIN) 10 MG tablet Take 1 tablet (10 mg total) by mouth daily. 90 tablet 3   Potassium Citrate 15 MEQ (1620 MG) TBCR TAKE ONE TABLET BY MOUTH TWICE DAILY 180 tablet 3   rosuvastatin (CRESTOR) 5 MG tablet TAKE ONE TABLET BY MOUTH ONE TIME DAILY 90 tablet 3   tamsulosin (FLOMAX) 0.4 MG CAPS capsule Take 1 capsule (0.4 mg total) by mouth daily. 90 capsule 3   triamcinolone ointment (KENALOG) 0.1 % Apply 1 application topically 2 (two) times daily. 80 g 2   diphenoxylate-atropine (LOMOTIL) 2.5-0.025 MG tablet Take 1 tablet by mouth 4 (four) times daily as needed for diarrhea or loose stools. 15 tablet 0   zolpidem (AMBIEN) 10 MG tablet Take 1 tablet (10 mg total) by mouth at bedtime as needed for sleep. 90 tablet 1   No facility-administered medications prior to visit.    ROS: Review of Systems  Constitutional:  Negative for appetite change, fatigue and unexpected weight change.  HENT:  Negative for congestion, nosebleeds, sneezing, sore throat and trouble swallowing.   Eyes:  Negative for itching and visual disturbance.  Respiratory:  Negative for cough.   Cardiovascular:  Negative for chest  pain, palpitations and leg swelling.  Gastrointestinal:  Negative for abdominal distention, blood in stool, diarrhea and nausea.  Genitourinary:  Negative for frequency and hematuria.  Musculoskeletal:  Negative for back pain, gait problem, joint swelling and neck pain.  Skin:  Negative for rash.  Neurological:  Negative for dizziness, tremors, speech difficulty and weakness.  Psychiatric/Behavioral:  Negative for agitation, dysphoric mood, sleep disturbance and suicidal ideas. The patient is not nervous/anxious.     Objective:  BP 110/70 (BP Location: Left Arm, Patient Position: Sitting, Cuff Size: Normal)   Pulse 60   Temp 97.9 F (36.6 C) (Oral)   Ht 5\' 5"  (1.651 m)   Wt 171 lb (77.6 kg)   SpO2 95%   BMI 28.46 kg/m   BP Readings from Last 3 Encounters:  09/14/22 110/70  08/12/22 121/80  07/28/22 120/72    Wt Readings from Last 3 Encounters:  09/14/22 171 lb (77.6 kg)  08/12/22 166 lb 0.1 oz (75.3 kg)  07/28/22 166 lb (75.3 kg)    Physical Exam Constitutional:      General: He is not in acute distress.    Appearance: Normal appearance. He is well-developed.     Comments: NAD  Eyes:     Conjunctiva/sclera: Conjunctivae normal.     Pupils: Pupils are equal, round, and reactive to light.  Neck:     Thyroid: No thyromegaly.  Vascular: No JVD.  Cardiovascular:     Rate and Rhythm: Normal rate and regular rhythm.     Heart sounds: Normal heart sounds. No murmur heard.    No friction rub. No gallop.  Pulmonary:     Effort: Pulmonary effort is normal. No respiratory distress.     Breath sounds: Normal breath sounds. No wheezing or rales.  Chest:     Chest wall: No tenderness.  Abdominal:     General: Bowel sounds are normal. There is no distension.     Palpations: Abdomen is soft. There is no mass.     Tenderness: There is no abdominal tenderness. There is no guarding or rebound.  Musculoskeletal:        General: No tenderness. Normal range of motion.      Cervical back: Normal range of motion.  Lymphadenopathy:     Cervical: No cervical adenopathy.  Skin:    General: Skin is warm and dry.     Findings: No rash.  Neurological:     Mental Status: He is alert and oriented to person, place, and time.     Cranial Nerves: No cranial nerve deficit.     Motor: No abnormal muscle tone.     Coordination: Coordination normal.     Gait: Gait normal.     Deep Tendon Reflexes: Reflexes are normal and symmetric.  Psychiatric:        Behavior: Behavior normal.        Thought Content: Thought content normal.        Judgment: Judgment normal.     Lab Results  Component Value Date   WBC 5.0 08/12/2022   HGB 16.0 08/12/2022   HCT 45.7 08/12/2022   PLT 166 08/12/2022   GLUCOSE 107 (H) 08/12/2022   CHOL 180 04/18/2021   TRIG 191.0 (H) 04/18/2021   HDL 41.90 04/18/2021   LDLDIRECT 136.0 04/09/2019   LDLCALC 100 (H) 04/18/2021   ALT 29 04/18/2021   AST 36 04/18/2021   NA 138 08/12/2022   K 4.4 08/12/2022   CL 104 08/12/2022   CREATININE 1.33 (H) 08/12/2022   BUN 19 08/12/2022   CO2 25 08/12/2022   TSH 3.78 04/18/2021   PSA 0.61 04/18/2021   HGBA1C 5.5 10/12/2010    No results found.  Assessment & Plan:   Problem List Items Addressed This Visit     Hyperglycemia   Relevant Orders   Hemoglobin A1c   Well adult exam - Primary    We discussed age appropriate health related issues, including available/recomended screening tests and vaccinations. We discussed a need for adhering to healthy diet and exercise. Labs were ordered to be later reviewed . All questions were answered. 2022 CT Coronary calcium score of 68.2. Shingrix info Cologuard (-) 2020      Relevant Medications   zolpidem (AMBIEN) 10 MG tablet   Other Relevant Orders   TSH   Urinalysis   CBC with Differential/Platelet   Lipid panel   PSA   Comprehensive metabolic panel   Hemoglobin A1c   Cologuard   Other Visit Diagnoses     Screening for colon cancer        Relevant Orders   Cologuard         Meds ordered this encounter  Medications   zolpidem (AMBIEN) 10 MG tablet    Sig: Take 1 tablet (10 mg total) by mouth at bedtime as needed for sleep.    Dispense:  90 tablet  Refill:  1      Follow-up: Return in about 6 months (around 03/17/2023) for a follow-up visit.  Sonda Primes, MD

## 2022-10-25 ENCOUNTER — Other Ambulatory Visit: Payer: Self-pay | Admitting: Internal Medicine

## 2022-11-22 ENCOUNTER — Other Ambulatory Visit: Payer: Self-pay | Admitting: Internal Medicine

## 2023-01-05 ENCOUNTER — Other Ambulatory Visit (INDEPENDENT_AMBULATORY_CARE_PROVIDER_SITE_OTHER): Payer: BC Managed Care – PPO

## 2023-01-05 DIAGNOSIS — R739 Hyperglycemia, unspecified: Secondary | ICD-10-CM | POA: Diagnosis not present

## 2023-01-05 DIAGNOSIS — Z0000 Encounter for general adult medical examination without abnormal findings: Secondary | ICD-10-CM | POA: Diagnosis not present

## 2023-01-05 LAB — LIPID PANEL
Cholesterol: 191 mg/dL (ref 0–200)
HDL: 43.4 mg/dL (ref >39.00)
LDL Cholesterol: 127 mg/dL — ABNORMAL HIGH (ref 0–99)
NonHDL: 147.98
Total CHOL/HDL Ratio: 4
Triglycerides: 107 mg/dL (ref 0.0–149.0)
VLDL: 21.4 mg/dL (ref 0.0–40.0)

## 2023-01-05 LAB — CBC WITH DIFFERENTIAL/PLATELET
Basophils Absolute: 0.1 10*3/uL (ref 0.0–0.1)
Basophils Relative: 1.3 % (ref 0.0–3.0)
Eosinophils Absolute: 0.5 10*3/uL (ref 0.0–0.7)
Eosinophils Relative: 13.1 % — ABNORMAL HIGH (ref 0.0–5.0)
HCT: 45.4 % (ref 39.0–52.0)
Hemoglobin: 15.3 g/dL (ref 13.0–17.0)
Lymphocytes Relative: 33.1 % (ref 12.0–46.0)
Lymphs Abs: 1.3 10*3/uL (ref 0.7–4.0)
MCHC: 33.7 g/dL (ref 30.0–36.0)
MCV: 89.6 fL (ref 78.0–100.0)
Monocytes Absolute: 0.4 10*3/uL (ref 0.1–1.0)
Monocytes Relative: 10.5 % (ref 3.0–12.0)
Neutro Abs: 1.7 10*3/uL (ref 1.4–7.7)
Neutrophils Relative %: 42 % — ABNORMAL LOW (ref 43.0–77.0)
Platelets: 172 10*3/uL (ref 150.0–400.0)
RBC: 5.07 Mil/uL (ref 4.22–5.81)
RDW: 13 % (ref 11.5–15.5)
WBC: 4 10*3/uL (ref 4.0–10.5)

## 2023-01-05 LAB — URINALYSIS
Bilirubin Urine: NEGATIVE
Hgb urine dipstick: NEGATIVE
Ketones, ur: NEGATIVE
Leukocytes,Ua: NEGATIVE
Nitrite: NEGATIVE
Specific Gravity, Urine: 1.025 (ref 1.000–1.030)
Total Protein, Urine: NEGATIVE
Urine Glucose: NEGATIVE
Urobilinogen, UA: 0.2 (ref 0.0–1.0)
pH: 6 (ref 5.0–8.0)

## 2023-01-05 LAB — COMPREHENSIVE METABOLIC PANEL
ALT: 17 U/L (ref 0–53)
AST: 20 U/L (ref 0–37)
Albumin: 4.3 g/dL (ref 3.5–5.2)
Alkaline Phosphatase: 53 U/L (ref 39–117)
BUN: 18 mg/dL (ref 6–23)
CO2: 26 meq/L (ref 19–32)
Calcium: 9.1 mg/dL (ref 8.4–10.5)
Chloride: 109 meq/L (ref 96–112)
Creatinine, Ser: 1.27 mg/dL (ref 0.40–1.50)
GFR: 61.93 mL/min (ref >60.00)
Glucose, Bld: 99 mg/dL (ref 70–99)
Potassium: 4.5 meq/L (ref 3.5–5.1)
Sodium: 143 meq/L (ref 135–145)
Total Bilirubin: 2.7 mg/dL — ABNORMAL HIGH (ref 0.2–1.2)
Total Protein: 6.4 g/dL (ref 6.0–8.3)

## 2023-01-05 LAB — TSH: TSH: 4.04 u[IU]/mL (ref 0.35–5.50)

## 2023-01-05 LAB — HEMOGLOBIN A1C: Hgb A1c MFr Bld: 5.8 % (ref 4.6–6.5)

## 2023-01-05 LAB — PSA: PSA: 0.56 ng/mL (ref 0.10–4.00)

## 2023-01-24 ENCOUNTER — Other Ambulatory Visit: Payer: Self-pay | Admitting: Emergency Medicine

## 2023-01-24 NOTE — Telephone Encounter (Signed)
Please send to appropriate PCP, Dr. Posey Rea

## 2023-01-29 ENCOUNTER — Telehealth: Payer: Self-pay | Admitting: Internal Medicine

## 2023-01-29 ENCOUNTER — Ambulatory Visit
Admission: RE | Admit: 2023-01-29 | Discharge: 2023-01-29 | Disposition: A | Discharge status: Home / Self Care | Payer: BC Managed Care – PPO | Source: Ambulatory Visit | Attending: Family Medicine | Admitting: Family Medicine

## 2023-01-29 ENCOUNTER — Encounter: Payer: Self-pay | Admitting: Family Medicine

## 2023-01-29 ENCOUNTER — Ambulatory Visit (INDEPENDENT_AMBULATORY_CARE_PROVIDER_SITE_OTHER): Payer: BC Managed Care – PPO | Admitting: Family Medicine

## 2023-01-29 VITALS — BP 104/60 | HR 64 | Temp 98.5°F | Resp 20 | Ht 65.0 in | Wt 170.0 lb

## 2023-01-29 DIAGNOSIS — R310 Gross hematuria: Secondary | ICD-10-CM

## 2023-01-29 DIAGNOSIS — N200 Calculus of kidney: Secondary | ICD-10-CM

## 2023-01-29 DIAGNOSIS — R1032 Left lower quadrant pain: Secondary | ICD-10-CM | POA: Diagnosis not present

## 2023-01-29 DIAGNOSIS — Z87442 Personal history of urinary calculi: Secondary | ICD-10-CM

## 2023-01-29 DIAGNOSIS — R109 Unspecified abdominal pain: Secondary | ICD-10-CM

## 2023-01-29 DIAGNOSIS — N138 Other obstructive and reflux uropathy: Secondary | ICD-10-CM

## 2023-01-29 LAB — POCT URINALYSIS DIPSTICK
Bilirubin, UA: NEGATIVE
Blood, UA: POSITIVE
Glucose, UA: NEGATIVE
Ketones, UA: POSITIVE
Nitrite, UA: NEGATIVE
Protein, UA: POSITIVE — AB
Spec Grav, UA: 1.02 (ref 1.010–1.025)
Urobilinogen, UA: NEGATIVE U/dL — AB
pH, UA: 5 (ref 5.0–8.0)

## 2023-01-29 NOTE — Telephone Encounter (Signed)
Called wife back - she had question about the referral and where it would be - noted/ aware [facility] was sent all info since he has seen them in the past.  Aware of [facility] and awaiting scheduling on their end.

## 2023-01-29 NOTE — Telephone Encounter (Signed)
Patient wants [facility] Mu[facility] to call them back.  They have a few more questions.  [phone]

## 2023-01-29 NOTE — Progress Notes (Signed)
Assessment & Plan:  1-3. Gross hematuria/Flank pain/History of nephrolithiasis Suspect kidney stones.  Continue tamsulosin 0.4 mg daily.  CT renal stone study to assess size of stones. - POCT urinalysis dipstick - Urine Culture; Future - CT RENAL STONE STUDY; Future  Results for orders placed or performed in visit on 01/29/23  POCT urinalysis dipstick  Result Value Ref Range   Color, UA yellow    Clarity, UA clear    Glucose, UA Negative Negative   Bilirubin, UA neg    Ketones, UA pos    Spec Grav, UA 1.020 1.010 - 1.025   Blood, UA pos    pH, UA 5.0 5.0 - 8.0   Protein, UA Positive (A) Negative   Urobilinogen, UA negative (A) 0.2 or 1.0 E.U./dL   Nitrite, UA neg    Leukocytes, UA Trace (A) Negative   Appearance     Odor      Follow up plan: Return if symptoms worsen or fail to improve.  Kyle Boston, MSN, APRN, FNP-C  Subjective:  HPI: Kyle Griffin. is a 57 y.o. male presenting on 01/29/2023 for Flank Pain (Bilateral - started about 2 weeks ago, and got worse over the weekend. Notice blood in urine about 1 week ago /Started :Left side more in the front and then radiated to back. )  Patient is accompanied by his wife, who he is okay with being present.  Patient reports left-sided flank pain that started in the left lower abdomen and radiates around to the back.  Pain started two weeks ago and worsened over the weekend. He is now having bilateral flank pain.  He did notice heavy blood in his urine two days last week.  Pain is accompanied by nausea.  Denies diarrhea, constipation, and fever.  He has a history of nephrolithiasis.  He is taking tamsulosin 0.4 mg daily.  He reports he took some Dilaudid on Saturday and Tylenol yesterday for the pain.   ROS: Negative unless specifically indicated above in HPI.   Relevant past medical history reviewed and updated as indicated.   Allergies and medications reviewed and updated.   Current Outpatient Medications:     aspirin EC 81 MG tablet, Take 81 mg by mouth daily., Disp: , Rfl:    Cholecalciferol (VITAMIN D3) 50 MCG (2000 UT) capsule, Take 1 capsule (2,000 Units total) by mouth daily., Disp: 100 capsule, Rfl: 3   escitalopram (LEXAPRO) 10 MG tablet, TAKE ONE TABLET BY MOUTH ONCE A DAY, Disp: 90 tablet, Rfl: 1   famotidine (PEPCID) 20 MG tablet, Take 1 tablet (20 mg total) by mouth daily. ulcers (Patient taking differently: Take 20 mg by mouth as needed for heartburn. ulcers), Disp: 30 tablet, Rfl: 2   fluticasone (FLONASE) 50 MCG/ACT nasal spray, Place 2 sprays into both nostrils daily., Disp: 16 g, Rfl: 6   loratadine (CLARITIN) 10 MG tablet, Take 1 tablet (10 mg total) by mouth daily., Disp: 90 tablet, Rfl: 3   Potassium Citrate 15 MEQ (1620 MG) TBCR, TAKE ONE TABLET BY MOUTH TWICE DAILY, Disp: 180 tablet, Rfl: 0   rosuvastatin (CRESTOR) 5 MG tablet, TAKE ONE TABLET BY MOUTH ONE TIME DAILY, Disp: 90 tablet, Rfl: 3   triamcinolone ointment (KENALOG) 0.1 %, Apply 1 application topically 2 (two) times daily., Disp: 80 g, Rfl: 2   zolpidem (AMBIEN) 10 MG tablet, Take 1 tablet (10 mg total) by mouth at bedtime as needed for sleep., Disp: 90 tablet, Rfl: 1   tamsulosin (FLOMAX)  0.4 MG CAPS capsule, Take 1 capsule (0.4 mg total) by mouth daily. (Patient not taking: Reported on 01/29/2023), Disp: 90 capsule, Rfl: 3  Allergies  Allergen Reactions   Oxycodone-Acetaminophen Hives and Itching    Objective:   BP 104/60   Pulse 64   Temp 98.5 F (36.9 C)   Resp 20   Ht 5\' 5"  (1.651 m)   Wt 170 lb (77.1 kg)   BMI 28.29 kg/m    Physical Exam Vitals reviewed.  Constitutional:      General: He is not in acute distress.    Appearance: Normal appearance. He is not ill-appearing, toxic-appearing or diaphoretic.  HENT:     Head: Normocephalic and atraumatic.  Eyes:     General: No scleral icterus.       Right eye: No discharge.        Left eye: No discharge.     Conjunctiva/sclera: Conjunctivae normal.   Cardiovascular:     Rate and Rhythm: Normal rate.  Pulmonary:     Effort: Pulmonary effort is normal. No respiratory distress.  Abdominal:     General: Bowel sounds are increased.     Palpations: Abdomen is soft.     Tenderness: There is abdominal tenderness in the left upper quadrant and left lower quadrant. There is left CVA tenderness. There is no right CVA tenderness.  Musculoskeletal:        General: Normal range of motion.     Cervical back: Normal range of motion.  Skin:    General: Skin is warm and dry.  Neurological:     Mental Status: He is alert and oriented to person, place, and time. Mental status is at baseline.  Psychiatric:        Mood and Affect: Mood normal.        Behavior: Behavior normal.        Thought Content: Thought content normal.        Judgment: Judgment normal.

## 2023-01-29 NOTE — Addendum Note (Signed)
Addended by: [name] F on: 01/29/2023 12:25 PM   Modules accepted: Orders

## 2023-01-30 ENCOUNTER — Other Ambulatory Visit: Payer: Self-pay | Admitting: Urology

## 2023-01-30 ENCOUNTER — Emergency Department (HOSPITAL_BASED_OUTPATIENT_CLINIC_OR_DEPARTMENT_OTHER)
Admission: EM | Admit: 2023-01-30 | Discharge: 2023-01-30 | Disposition: A | Discharge status: Home / Self Care | Payer: BC Managed Care – PPO | Attending: Emergency Medicine | Admitting: Emergency Medicine

## 2023-01-30 ENCOUNTER — Other Ambulatory Visit: Payer: Self-pay

## 2023-01-30 ENCOUNTER — Encounter (HOSPITAL_BASED_OUTPATIENT_CLINIC_OR_DEPARTMENT_OTHER): Payer: Self-pay | Admitting: Emergency Medicine

## 2023-01-30 ENCOUNTER — Telehealth: Payer: Self-pay | Admitting: Internal Medicine

## 2023-01-30 DIAGNOSIS — R109 Unspecified abdominal pain: Secondary | ICD-10-CM | POA: Diagnosis not present

## 2023-01-30 DIAGNOSIS — N179 Acute kidney failure, unspecified: Secondary | ICD-10-CM | POA: Diagnosis not present

## 2023-01-30 DIAGNOSIS — I10 Essential (primary) hypertension: Secondary | ICD-10-CM | POA: Insufficient documentation

## 2023-01-30 DIAGNOSIS — N200 Calculus of kidney: Secondary | ICD-10-CM | POA: Diagnosis not present

## 2023-01-30 DIAGNOSIS — Z87891 Personal history of nicotine dependence: Secondary | ICD-10-CM | POA: Insufficient documentation

## 2023-01-30 DIAGNOSIS — N202 Calculus of kidney with calculus of ureter: Secondary | ICD-10-CM | POA: Diagnosis not present

## 2023-01-30 LAB — CBC
HCT: 39.5 % (ref 39.0–52.0)
Hemoglobin: 13.6 g/dL (ref 13.0–17.0)
MCH: 29.7 pg (ref 26.0–34.0)
MCHC: 34.4 g/dL (ref 30.0–36.0)
MCV: 86.2 fL (ref 80.0–100.0)
Platelets: 155 10*3/uL (ref 150–400)
RBC: 4.58 MIL/uL (ref 4.22–5.81)
RDW: 12.4 % (ref 11.5–15.5)
WBC: 8.8 10*3/uL (ref 4.0–10.5)
nRBC: 0 % (ref 0.0–0.2)

## 2023-01-30 LAB — COMPREHENSIVE METABOLIC PANEL
ALT: 14 U/L (ref 0–44)
AST: 15 U/L (ref 15–41)
Albumin: 4 g/dL (ref 3.5–5.0)
Alkaline Phosphatase: 53 U/L (ref 38–126)
Anion gap: 9 (ref 5–15)
BUN: 27 mg/dL — ABNORMAL HIGH (ref 6–20)
CO2: 25 mmol/L (ref 22–32)
Calcium: 9.2 mg/dL (ref 8.9–10.3)
Chloride: 105 mmol/L (ref 98–111)
Creatinine, Ser: 1.97 mg/dL — ABNORMAL HIGH (ref 0.61–1.24)
GFR, Estimated: 38 mL/min — ABNORMAL LOW (ref >60)
Glucose, Bld: 113 mg/dL — ABNORMAL HIGH (ref 70–99)
Potassium: 3.9 mmol/L (ref 3.5–5.1)
Sodium: 139 mmol/L (ref 135–145)
Total Bilirubin: 3.4 mg/dL — ABNORMAL HIGH (ref <1.2)
Total Protein: 6.6 g/dL (ref 6.5–8.1)

## 2023-01-30 LAB — URINALYSIS, ROUTINE W REFLEX MICROSCOPIC
Bilirubin Urine: NEGATIVE
Glucose, UA: NEGATIVE mg/dL
Ketones, ur: NEGATIVE mg/dL
Leukocytes,Ua: NEGATIVE
Nitrite: NEGATIVE
Protein, ur: NEGATIVE mg/dL
Specific Gravity, Urine: 1.03 (ref 1.005–1.030)
pH: 6 (ref 5.0–8.0)

## 2023-01-30 LAB — URINE CULTURE: Result:: NO GROWTH

## 2023-01-30 MED ORDER — SODIUM CHLORIDE 0.9 % IV BOLUS
1000.0000 mL | Freq: Once | INTRAVENOUS | Status: AC
  Administered 2023-01-30: 1000 mL via INTRAVENOUS

## 2023-01-30 MED ORDER — HYDROMORPHONE HCL 2 MG PO TABS: 2.0000 mg | ORAL_TABLET | ORAL | 0 refills | Status: DC | PRN

## 2023-01-30 MED ORDER — TAMSULOSIN HCL 0.4 MG PO CAPS: 0.4000 mg | ORAL_CAPSULE | Freq: Every day | ORAL | 0 refills | Status: AC

## 2023-01-30 MED ORDER — TAMSULOSIN HCL 0.4 MG PO CAPS: 0.4000 mg | ORAL_CAPSULE | Freq: Every day | ORAL | 0 refills | Status: DC

## 2023-01-30 MED ORDER — HYDROMORPHONE HCL 1 MG/ML IJ SOLN
1.0000 mg | Freq: Once | INTRAMUSCULAR | Status: AC
Start: 2023-01-30 — End: 2023-01-30
  Administered 2023-01-30: 1 mg via INTRAVENOUS
  Filled 2023-01-30: qty 1

## 2023-01-30 MED ORDER — ONDANSETRON HCL 4 MG/2ML IJ SOLN
4.0000 mg | Freq: Once | INTRAMUSCULAR | Status: AC
  Administered 2023-01-30: 4 mg via INTRAVENOUS
  Filled 2023-01-30: qty 2

## 2023-01-30 MED ORDER — KETOROLAC TROMETHAMINE 15 MG/ML IJ SOLN
15.0000 mg | Freq: Once | INTRAMUSCULAR | Status: AC
  Administered 2023-01-30: 15 mg via INTRAVENOUS
  Filled 2023-01-30: qty 1

## 2023-01-30 NOTE — ED Triage Notes (Addendum)
Patient presents with left sided flank pain and nausea. Reports he was diagnosed with kidney stone yesterday by pcp. States taking "old dilaudid prescription" at 2200 with no relief.  Patient reports awaiting appointment with Urology for possible lithotripsy.

## 2023-01-30 NOTE — Telephone Encounter (Signed)
[facility] called to ask if it is ok to stop the pt's asprin RX because he has a lithotripsy scheduled on Friday.  Please call [phone]

## 2023-01-30 NOTE — Progress Notes (Signed)
Pre op phone call completed. NPO after midnight. Hold on Aspirin. Arrive at [facility].

## 2023-01-30 NOTE — ED Notes (Signed)
Initial contact made. Pt is resting in bed. A&Ox4, pt endorses left flank pain 7-8/10.

## 2023-01-30 NOTE — Discharge Instructions (Addendum)
You were evaluated in the Emergency Department and after careful evaluation, we did not find any emergent condition requiring admission or further testing in the hospital.  Your exam/testing today is overall reassuring.  Symptoms likely due to a kidney stone.  The kidney stone with some dehydration caused mild acute kidney injury, this can be corrected with a lot of fluids at home as we discussed.  Important to keep your follow-up with the urologist for continued care.  Use the Flomax daily to help pass the stone, use the Dilaudid as needed for pain.  Please return to the Emergency Department if you experience any worsening of your condition.   Thank you for allowing [place] to be a part of your care.

## 2023-01-30 NOTE — ED Provider Notes (Signed)
[facility]GENCY [facility] Emergency Department Provider Note MRN:  [id number]  Arrival date & time: 01/30/23     Chief Complaint   Flank Pain   History of Present Illness   Kyle Griffin. is a 57 y.o. year-old male with a history of kidney stone presenting to the ED with chief complaint of kidney stone.  Diagnosed with a kidney stone a few days ago, has been having left flank pain for about 2 weeks, getting much worse recently, severe this evening.  Home Dilaudid which was an old prescription did not work.  Associated with nausea.  Review of Systems  A thorough review of systems was obtained and all systems are negative except as noted in the HPI and PMH.   Patient's Health History    Past Medical History:  Diagnosis Date   Allergic rhinitis    Asthma, exercise induced    last exaberation was 10 years ago   GERD (gastroesophageal reflux disease)    Sullivan Lone disease    Hyperlipidemia    IBS (irritable bowel syndrome)    Nephrolithiasis     Past Surgical History:  Procedure Laterality Date   EXTRACORPOREAL SHOCK WAVE LITHOTRIPSY Right 08/09/2017   Procedure: RIGHT EXTRACORPOREAL SHOCK WAVE LITHOTRIPSY (ESWL);  Surgeon: Jerilee Field, MD;  Location: [facility];  Service: Urology;  Laterality: Right;   EXTRACORPOREAL SHOCK WAVE LITHOTRIPSY Left 12/06/2017   Procedure: EXTRACORPOREAL SHOCK WAVE LITHOTRIPSY (ESWL);  Surgeon: Malen Gauze, MD;  Location: [facility];  Service: Urology;  Laterality: Left;   EXTRACORPOREAL SHOCK WAVE LITHOTRIPSY Right 05/23/2018   Procedure: EXTRACORPOREAL SHOCK WAVE LITHOTRIPSY (ESWL);  Surgeon: Bjorn Pippin, MD;  Location: [facility];  Service: Urology;  Laterality: Right;   FOOT SURGERY     Cyst, Heel   ROTATOR CUFF REPAIR      Family History  Problem Relation Age of Onset   Coronary artery disease Mother    Stroke Mother    Heart disease Mother    Hyperlipidemia Mother    Cancer Mother 64       lung ca   Heart disease Father     Hyperlipidemia Father    Diabetes Neg Hx     Social History   Socioeconomic History   Marital status: Married    Spouse name: Not on file   Number of children: 2   Years of education: Not on file   Highest education level: Not on file  Occupational History   Occupation: Production designer, theatre/television/film Time Psychologist, forensic  Tobacco Use   Smoking status: Former    Current packs/day: 0.00    Types: Cigarettes    Quit date: 12/23/1999    Years since quitting: 23.1   Smokeless tobacco: Former  Building services engineer status: Never Used  Substance and Sexual Activity   Alcohol use: No   Drug use: No   Sexual activity: Yes  Other Topics Concern   Not on file  Social History Narrative   Regular Exercise -  YES         Social Determinants of Health   Financial Resource Strain: Not on file  Food Insecurity: Not on file  Transportation Needs: Not on file  Physical Activity: Not on file  Stress: Not on file  Social Connections: Not on file  Intimate Partner Violence: Not on file     Physical Exam   Vitals:   01/30/23 0330 01/30/23 0430  BP: 127/79 124/83  Pulse: 71 60  Resp:    Temp:  SpO2: 93% 97%    CONSTITUTIONAL: Well-appearing, NAD NEURO/PSYCH:  Alert and oriented x 3, no focal deficits EYES:  eyes equal and reactive ENT/NECK:  no LAD, no JVD CARDIO: Regular rate, well-perfused, normal S1 and S2 PULM:  CTAB no wheezing or rhonchi GI/GU:  non-distended, non-tender MSK/SPINE:  No gross deformities, no edema SKIN:  no rash, atraumatic   *Additional and/or pertinent findings included in MDM below  Diagnostic and Interventional Summary    EKG Interpretation Date/Time:    Ventricular Rate:    PR Interval:    QRS Duration:    QT Interval:    QTC Calculation:   R Axis:      Text Interpretation:         Labs Reviewed  COMPREHENSIVE METABOLIC PANEL - Abnormal; Notable for the following components:      Result Value   Glucose, Bld 113 (*)    BUN 27 (*)    Creatinine, Ser 1.97 (*)     Total Bilirubin 3.4 (*)    GFR, Estimated 38 (*)    All other components within normal limits  URINALYSIS, ROUTINE W REFLEX MICROSCOPIC - Abnormal; Notable for the following components:   Hgb urine dipstick SMALL (*)    All other components within normal limits  CBC    No orders to display    Medications  HYDROmorphone (DILAUDID) injection 1 mg (1 mg Intravenous Given 01/30/23 0217)  ondansetron (ZOFRAN) injection 4 mg (4 mg Intravenous Given 01/30/23 0217)  ketorolac (TORADOL) 15 MG/ML injection 15 mg (15 mg Intravenous Given 01/30/23 0216)  sodium chloride 0.9 % bolus 1,000 mL (0 mLs Intravenous Stopped 01/30/23 0422)     Procedures  /  Critical Care Procedures  ED Course and Medical Decision Making  Initial Impression and Ddx Known 5 mm kidney stone suspect causing increased pain.  Providing pain control, rechecking kidney function, urinalysis, currently without indication for repeat imaging.  Past medical/surgical history that increases complexity of ED encounter: Kidney stone  Interpretation of Diagnostics I personally reviewed the laboratory assessment and my interpretation is as follows: Elevated creatinine compared to prior, otherwise no significant blood count or electrolyte disturbance urinalysis unremarkable.    Patient Reassessment and Ultimate Disposition/Management     Pain is well-controlled, given fluids in response to the elevated creatinine and patient made aware of this finding, needs close follow-up to follow this kidney function, which she has in the form urology and scheduled lithotripsy, strict return precautions.  Patient management required discussion with the following services or consulting groups:  None  Complexity of Problems Addressed Acute illness or injury that poses threat of life of bodily function  Additional Data Reviewed and Analyzed Further history obtained from: Further history from spouse/family member  Additional Factors  Impacting ED Encounter Risk Prescriptions and Use of parenteral controlled substances  Elmer Sow. Pilar Plate, MD [facility] Emergency Medicine [facility] [email or web address]  Final Clinical Impressions(s) / ED Diagnoses     ICD-10-CM   1. Kidney stone  N20.0     2. Acute kidney injury (HCC)  N17.9       ED Discharge Orders          Ordered    tamsulosin (FLOMAX) 0.4 MG CAPS capsule  Daily        01/30/23 0452    HYDROmorphone (DILAUDID) 2 MG tablet  Every 4 hours PRN        01/30/23 0452  Discharge Instructions Discussed with and Provided to Patient:     Discharge Instructions      You were evaluated in the Emergency Department and after careful evaluation, we did not find any emergent condition requiring admission or further testing in the hospital.  Your exam/testing today is overall reassuring.  Symptoms likely due to a kidney stone.  The kidney stone with some dehydration caused mild acute kidney injury, this can be corrected with a lot of fluids at home as we discussed.  Important to keep your follow-up with the urologist for continued care.  Use the Flomax daily to help pass the stone, use the Dilaudid as needed for pain.  Please return to the Emergency Department if you experience any worsening of your condition.   Thank you for allowing [place] to be a part of your care.       Sabas Sous, MD 01/30/23 [phone]

## 2023-01-31 NOTE — Telephone Encounter (Signed)
Okay to stop aspirin per urology lithotripsy protocol.  Thank you

## 2023-01-31 NOTE — Telephone Encounter (Signed)
LM on Kyle Griffin with [facility]'s voicemail advising it is ok per PCP to stop asprin for lithotripsy on Friday

## 2023-02-01 DIAGNOSIS — N202 Calculus of kidney with calculus of ureter: Secondary | ICD-10-CM | POA: Diagnosis not present

## 2023-02-01 DIAGNOSIS — R8271 Bacteriuria: Secondary | ICD-10-CM | POA: Diagnosis not present

## 2023-02-02 ENCOUNTER — Encounter (HOSPITAL_BASED_OUTPATIENT_CLINIC_OR_DEPARTMENT_OTHER): Payer: Self-pay | Admitting: Urology

## 2023-02-02 ENCOUNTER — Ambulatory Visit (HOSPITAL_BASED_OUTPATIENT_CLINIC_OR_DEPARTMENT_OTHER)
Admission: RE | Admit: 2023-02-02 | Discharge: 2023-02-02 | Disposition: A | Discharge status: Home / Self Care | Payer: BC Managed Care – PPO | Attending: Urology | Admitting: Urology

## 2023-02-02 ENCOUNTER — Ambulatory Visit (HOSPITAL_COMMUNITY): Payer: BC Managed Care – PPO

## 2023-02-02 ENCOUNTER — Encounter (HOSPITAL_BASED_OUTPATIENT_CLINIC_OR_DEPARTMENT_OTHER)
Admission: RE | Disposition: A | Discharge status: Home / Self Care | Payer: Self-pay | Source: Home / Self Care | Attending: Urology

## 2023-02-02 DIAGNOSIS — Z87891 Personal history of nicotine dependence: Secondary | ICD-10-CM | POA: Diagnosis not present

## 2023-02-02 DIAGNOSIS — N189 Chronic kidney disease, unspecified: Secondary | ICD-10-CM | POA: Insufficient documentation

## 2023-02-02 DIAGNOSIS — N201 Calculus of ureter: Secondary | ICD-10-CM | POA: Diagnosis not present

## 2023-02-02 DIAGNOSIS — N179 Acute kidney failure, unspecified: Secondary | ICD-10-CM | POA: Insufficient documentation

## 2023-02-02 DIAGNOSIS — K219 Gastro-esophageal reflux disease without esophagitis: Secondary | ICD-10-CM | POA: Diagnosis not present

## 2023-02-02 DIAGNOSIS — I878 Other specified disorders of veins: Secondary | ICD-10-CM | POA: Diagnosis not present

## 2023-02-02 DIAGNOSIS — N200 Calculus of kidney: Secondary | ICD-10-CM | POA: Diagnosis not present

## 2023-02-02 HISTORY — PX: EXTRACORPOREAL SHOCK WAVE LITHOTRIPSY: SHX1557

## 2023-02-02 SURGERY — LITHOTRIPSY, ESWL
Anesthesia: LOCAL | Laterality: Left

## 2023-02-02 MED ORDER — DIAZEPAM 5 MG PO TABS
ORAL_TABLET | ORAL | Status: AC
  Filled 2023-02-02: qty 2

## 2023-02-02 MED ORDER — DIAZEPAM 5 MG PO TABS
10.0000 mg | ORAL_TABLET | ORAL | Status: AC
  Administered 2023-02-02: 10 mg via ORAL

## 2023-02-02 MED ORDER — CIPROFLOXACIN HCL 500 MG PO TABS
500.0000 mg | ORAL_TABLET | ORAL | Status: AC
  Administered 2023-02-02: 500 mg via ORAL

## 2023-02-02 MED ORDER — DIPHENHYDRAMINE HCL 25 MG PO CAPS
25.0000 mg | ORAL_CAPSULE | ORAL | Status: AC
  Administered 2023-02-02: 25 mg via ORAL

## 2023-02-02 MED ORDER — DIPHENHYDRAMINE HCL 25 MG PO CAPS
ORAL_CAPSULE | ORAL | Status: AC
  Filled 2023-02-02: qty 1

## 2023-02-02 MED ORDER — CIPROFLOXACIN HCL 500 MG PO TABS
ORAL_TABLET | ORAL | Status: AC
  Filled 2023-02-02: qty 1

## 2023-02-02 MED ORDER — SODIUM CHLORIDE 0.9 % IV SOLN: INTRAVENOUS | Status: DC

## 2023-02-02 NOTE — H&P (Signed)
Urology Preoperative H&P   Chief Complaint: Left ureteral stone  History of Present Illness: Kyle Griffin. is a 57 y.o. male with a left ureteral stone who was here for ESWL. Unfortunately, we could not visualize the stone with fluoroscopy. We were unable to use contrast given his AKI on CKD. He had some left flank pain yesterday that resolved. He is unsure if he passed his stone.    Past Medical History:  Diagnosis Date   Allergic rhinitis    Asthma, exercise induced    last exaberation was 10 years ago   GERD (gastroesophageal reflux disease)    Sullivan Lone disease    Hyperlipidemia    IBS (irritable bowel syndrome)    Nephrolithiasis     Past Surgical History:  Procedure Laterality Date   EXTRACORPOREAL SHOCK WAVE LITHOTRIPSY Right 08/09/2017   Procedure: RIGHT EXTRACORPOREAL SHOCK WAVE LITHOTRIPSY (ESWL);  Surgeon: Jerilee Field, MD;  Location: [facility];  Service: Urology;  Laterality: Right;   EXTRACORPOREAL SHOCK WAVE LITHOTRIPSY Left 12/06/2017   Procedure: EXTRACORPOREAL SHOCK WAVE LITHOTRIPSY (ESWL);  Surgeon: Malen Gauze, MD;  Location: [facility];  Service: Urology;  Laterality: Left;   EXTRACORPOREAL SHOCK WAVE LITHOTRIPSY Right 05/23/2018   Procedure: EXTRACORPOREAL SHOCK WAVE LITHOTRIPSY (ESWL);  Surgeon: Bjorn Pippin, MD;  Location: [facility];  Service: Urology;  Laterality: Right;   FOOT SURGERY     Cyst, Heel   ROTATOR CUFF REPAIR      Allergies:  Allergies  Allergen Reactions   Oxycodone-Acetaminophen Hives and Itching    Family History  Problem Relation Age of Onset   Coronary artery disease Mother    Stroke Mother    Heart disease Mother    Hyperlipidemia Mother    Cancer Mother 18       lung ca   Heart disease Father    Hyperlipidemia Father    Diabetes Neg Hx     Social History:  reports that he quit smoking about 23 years ago. His smoking use included cigarettes. He has quit using smokeless tobacco. He reports that he does not drink  alcohol and does not use drugs.  ROS: A complete review of systems was performed.  All systems are negative except for pertinent findings as noted.  Physical Exam:  Vital signs in last 24 hours: Temp:  [98 F (36.7 C)] 98 F (36.7 C) (12/13 1000) Pulse Rate:  [67] 67 (12/13 1000) Resp:  [17] 17 (12/13 1000) BP: (114)/(84) 114/84 (12/13 1000) SpO2:  [98 %] 98 % (12/13 1000) Weight:  [74.8 kg] 74.8 kg (12/13 1000) Constitutional:  Alert and oriented, No acute distress Cardiovascular: Regular rate and rhythm Respiratory: Normal respiratory effort, Lungs clear bilaterally GI: Abdomen is soft, nontender, nondistended, no abdominal masses GU: No CVA tenderness Lymphatic: No lymphadenopathy Neurologic: Grossly intact, no focal deficits Psychiatric: Normal mood and affect  Laboratory Data:  No results for input(s): "WBC", "HGB", "HCT", "PLT" in the last 72 hours.  No results for input(s): "NA", "K", "CL", "GLUCOSE", "BUN", "CALCIUM", "CREATININE" in the last 72 hours.  Invalid input(s): "CO3"   No results found for this or any previous visit (from the past 24 hours). Recent Results (from the past 240 hours)  Urine Culture     Status: None   Collection Time: 01/29/23 10:21 AM   Specimen: Urine  Result Value Ref Range Status   Source: NOT GIVEN  Final   Status: FINAL  Final   Result: No Growth  Final  Renal Function: Recent Labs    01/30/23 0213  CREATININE 1.97*   Estimated Creatinine Clearance: 38.1 mL/min (A) (by C-G formula based on SCr of 1.97 mg/dL (H)).  Radiologic Imaging: No results found.  I independently reviewed the above imaging studies.  Assessment and Plan Kyle Hanser. is a 57 y.o. male with nfortunately, we could not visualize the stone with fluoroscopy. We were unable to use contrast given his AKI on CKD. Unable to proceed with ESWL today. He will f/u in office and if recurrent pain, will consider ureteroscopy with laser lithotripsy.      Kyle R. Darron Stuck MD 02/02/2023, 10:55 AM  [facility] Pager: [phone]): [phone]

## 2023-02-05 ENCOUNTER — Encounter (HOSPITAL_COMMUNITY)
Admission: RE | Disposition: A | Discharge status: Home / Self Care | Payer: Self-pay | Source: Home / Self Care | Attending: Urology

## 2023-02-05 ENCOUNTER — Ambulatory Visit (HOSPITAL_COMMUNITY): Payer: BC Managed Care – PPO | Admitting: Anesthesiology

## 2023-02-05 ENCOUNTER — Ambulatory Visit (HOSPITAL_COMMUNITY)
Admission: RE | Admit: 2023-02-05 | Discharge: 2023-02-05 | Disposition: A | Discharge status: Home / Self Care | Payer: BC Managed Care – PPO | Attending: Urology | Admitting: Urology

## 2023-02-05 ENCOUNTER — Other Ambulatory Visit: Payer: Self-pay | Admitting: Urology

## 2023-02-05 ENCOUNTER — Ambulatory Visit (HOSPITAL_COMMUNITY): Payer: BC Managed Care – PPO

## 2023-02-05 ENCOUNTER — Encounter (HOSPITAL_BASED_OUTPATIENT_CLINIC_OR_DEPARTMENT_OTHER): Payer: Self-pay | Admitting: Urology

## 2023-02-05 DIAGNOSIS — Z87891 Personal history of nicotine dependence: Secondary | ICD-10-CM | POA: Diagnosis not present

## 2023-02-05 DIAGNOSIS — J45909 Unspecified asthma, uncomplicated: Secondary | ICD-10-CM | POA: Diagnosis not present

## 2023-02-05 DIAGNOSIS — N202 Calculus of kidney with calculus of ureter: Secondary | ICD-10-CM | POA: Diagnosis not present

## 2023-02-05 DIAGNOSIS — K219 Gastro-esophageal reflux disease without esophagitis: Secondary | ICD-10-CM | POA: Insufficient documentation

## 2023-02-05 HISTORY — PX: CYSTOSCOPY/RETROGRADE/URETEROSCOPY: SHX5316

## 2023-02-05 SURGERY — CYSTOSCOPY/RETROGRADE/URETEROSCOPY
Anesthesia: General | Laterality: Left

## 2023-02-05 MED ORDER — FENTANYL CITRATE PF 50 MCG/ML IJ SOSY
25.0000 ug | PREFILLED_SYRINGE | INTRAMUSCULAR | Status: DC | PRN
  Administered 2023-02-05 (×2): 25 ug via INTRAVENOUS

## 2023-02-05 MED ORDER — ACETAMINOPHEN 160 MG/5ML PO SOLN: 325.0000 mg | ORAL | Status: DC | PRN

## 2023-02-05 MED ORDER — MIDAZOLAM HCL 2 MG/2ML IJ SOLN
INTRAMUSCULAR | Status: AC
  Filled 2023-02-05: qty 2

## 2023-02-05 MED ORDER — PHENAZOPYRIDINE HCL 200 MG PO TABS
ORAL_TABLET | ORAL | Status: AC
  Filled 2023-02-05: qty 1

## 2023-02-05 MED ORDER — SODIUM CHLORIDE 0.9 % IR SOLN
Status: DC | PRN
  Administered 2023-02-05: 3000 mL

## 2023-02-05 MED ORDER — SODIUM CHLORIDE 0.9 % IV SOLN: INTRAVENOUS | Status: DC

## 2023-02-05 MED ORDER — ACETAMINOPHEN 325 MG PO TABS: 325.0000 mg | ORAL_TABLET | ORAL | Status: DC | PRN

## 2023-02-05 MED ORDER — PROPOFOL 10 MG/ML IV BOLUS
INTRAVENOUS | Status: DC | PRN
  Administered 2023-02-05: 200 mg via INTRAVENOUS

## 2023-02-05 MED ORDER — HYDROMORPHONE HCL 2 MG PO TABS: 2.0000 mg | ORAL_TABLET | ORAL | Status: DC | PRN

## 2023-02-05 MED ORDER — CIPROFLOXACIN IN D5W 400 MG/200ML IV SOLN
400.0000 mg | INTRAVENOUS | Status: AC
  Administered 2023-02-05: 400 mg via INTRAVENOUS

## 2023-02-05 MED ORDER — ONDANSETRON HCL 4 MG/2ML IJ SOLN
INTRAMUSCULAR | Status: AC
  Filled 2023-02-05: qty 2

## 2023-02-05 MED ORDER — PHENAZOPYRIDINE HCL 200 MG PO TABS
200.0000 mg | ORAL_TABLET | Freq: Once | ORAL | Status: AC
Start: 2023-02-05 — End: 2023-02-05
  Administered 2023-02-05: 200 mg via ORAL

## 2023-02-05 MED ORDER — ONDANSETRON HCL 4 MG/2ML IJ SOLN
INTRAMUSCULAR | Status: DC | PRN
  Administered 2023-02-05: 4 mg via INTRAVENOUS

## 2023-02-05 MED ORDER — ACETAMINOPHEN 10 MG/ML IV SOLN: 1000.0000 mg | Freq: Once | INTRAVENOUS | Status: DC | PRN

## 2023-02-05 MED ORDER — DROPERIDOL 2.5 MG/ML IJ SOLN: 0.6250 mg | Freq: Once | INTRAMUSCULAR | Status: DC | PRN

## 2023-02-05 MED ORDER — DEXAMETHASONE SODIUM PHOSPHATE 10 MG/ML IJ SOLN
INTRAMUSCULAR | Status: AC
  Filled 2023-02-05: qty 1

## 2023-02-05 MED ORDER — FENTANYL CITRATE (PF) 100 MCG/2ML IJ SOLN
INTRAMUSCULAR | Status: AC
  Filled 2023-02-05: qty 2

## 2023-02-05 MED ORDER — FENTANYL CITRATE (PF) 100 MCG/2ML IJ SOLN
INTRAMUSCULAR | Status: DC | PRN
  Administered 2023-02-05: 50 ug via INTRAVENOUS

## 2023-02-05 MED ORDER — OXYCODONE HCL 5 MG PO TABS: 5.0000 mg | ORAL_TABLET | Freq: Once | ORAL | Status: DC | PRN

## 2023-02-05 MED ORDER — LIDOCAINE HCL (CARDIAC) PF 100 MG/5ML IV SOSY
PREFILLED_SYRINGE | INTRAVENOUS | Status: DC | PRN
  Administered 2023-02-05: 40 mg via INTRAVENOUS

## 2023-02-05 MED ORDER — PROPOFOL 10 MG/ML IV BOLUS
INTRAVENOUS | Status: AC
  Filled 2023-02-05: qty 20

## 2023-02-05 MED ORDER — SODIUM CHLORIDE 0.9% FLUSH: 3.0000 mL | Freq: Two times a day (BID) | INTRAVENOUS | Status: DC

## 2023-02-05 MED ORDER — IOHEXOL 300 MG/ML  SOLN
INTRAMUSCULAR | Status: DC | PRN
  Administered 2023-02-05: 4 mL

## 2023-02-05 MED ORDER — LACTATED RINGERS IV SOLN: INTRAVENOUS | Status: DC | PRN

## 2023-02-05 MED ORDER — LIDOCAINE HCL (PF) 2 % IJ SOLN
INTRAMUSCULAR | Status: AC
  Filled 2023-02-05: qty 5

## 2023-02-05 MED ORDER — FENTANYL CITRATE PF 50 MCG/ML IJ SOSY
PREFILLED_SYRINGE | INTRAMUSCULAR | Status: AC
  Filled 2023-02-05: qty 1

## 2023-02-05 MED ORDER — OXYCODONE HCL 5 MG/5ML PO SOLN: 5.0000 mg | Freq: Once | ORAL | Status: DC | PRN

## 2023-02-05 MED ORDER — FENTANYL CITRATE PF 50 MCG/ML IJ SOSY
PREFILLED_SYRINGE | INTRAMUSCULAR | Status: AC
  Administered 2023-02-05: 25 ug via INTRAVENOUS
  Filled 2023-02-05: qty 1

## 2023-02-05 MED ORDER — MIDAZOLAM HCL 5 MG/5ML IJ SOLN
INTRAMUSCULAR | Status: DC | PRN
  Administered 2023-02-05: 2 mg via INTRAVENOUS

## 2023-02-05 MED ORDER — CIPROFLOXACIN IN D5W 400 MG/200ML IV SOLN
INTRAVENOUS | Status: AC
  Filled 2023-02-05: qty 200

## 2023-02-05 SURGICAL SUPPLY — 20 items
BAG URO CATCHER STRL LF (MISCELLANEOUS) ×1 IMPLANT
BASKET STONE NCOMPASS (UROLOGICAL SUPPLIES) IMPLANT
CATH URETERAL DUAL LUMEN 10F (MISCELLANEOUS) IMPLANT
CATH URETL OPEN 5X70 (CATHETERS) IMPLANT
CLOTH BEACON ORANGE TIMEOUT ST (SAFETY) ×1 IMPLANT
EXTRACTOR STONE NITINOL NGAGE (UROLOGICAL SUPPLIES) IMPLANT
GLOVE SURG SS PI 8.0 STRL IVOR (GLOVE) ×1 IMPLANT
GOWN SPEC L4 XLG W/TWL (GOWN DISPOSABLE) ×1 IMPLANT
GUIDEWIRE STR DUAL SENSOR (WIRE) ×1 IMPLANT
IV NS IRRIG 3000ML ARTHROMATIC (IV SOLUTION) ×1 IMPLANT
KIT TURNOVER KIT A (KITS) IMPLANT
LASER FIB FLEXIVA PULSE ID 365 (Laser) IMPLANT
LASER FIB FLEXIVA PULSE ID 550 (Laser) IMPLANT
LASER FIB FLEXIVA PULSE ID 910 (Laser) IMPLANT
MANIFOLD NEPTUNE II (INSTRUMENTS) ×1 IMPLANT
PACK CYSTO (CUSTOM PROCEDURE TRAY) ×1 IMPLANT
SHEATH NAVIGATOR HD 11/13X36 (SHEATH) IMPLANT
TRACTIP FLEXIVA PULS ID 200XHI (Laser) IMPLANT
TUBING CONNECTING 10 (TUBING) ×1 IMPLANT
TUBING UROLOGY SET (TUBING) ×1 IMPLANT

## 2023-02-05 NOTE — Anesthesia Preprocedure Evaluation (Addendum)
Anesthesia Evaluation  Patient identified by MRN, date of birth, ID band Patient awake    Reviewed: Allergy & Precautions, NPO status , Patient's Chart, lab work & pertinent test results  Airway Mallampati: II  TM Distance: >3 FB Neck ROM: Full    Dental  (+) Teeth Intact, Dental Advisory Given   Pulmonary asthma , former smoker   breath sounds clear to auscultation       Cardiovascular negative cardio ROS  Rhythm:Regular Rate:Normal     Neuro/Psych  PSYCHIATRIC DISORDERS  Depression     Neuromuscular disease    GI/Hepatic Neg liver ROS,GERD  Medicated,,  Endo/Other  negative endocrine ROS    Renal/GU Renal disease     Musculoskeletal  (+) Arthritis ,    Abdominal   Peds  Hematology negative hematology ROS (+)   Anesthesia Other Findings - HLD  Reproductive/Obstetrics                             Anesthesia Physical Anesthesia Plan  ASA: 2  Anesthesia Plan: General   Post-op Pain Management: Tylenol PO (pre-op)*   Induction: Intravenous  PONV Risk Score and Plan: 3 and Ondansetron, Dexamethasone and Midazolam  Airway Management Planned: LMA  Additional Equipment: None  Intra-op Plan:   Post-operative Plan: Extubation in OR  Informed Consent: I have reviewed the patients History and Physical, chart, labs and discussed the procedure including the risks, benefits and alternatives for the proposed anesthesia with the patient or authorized representative who has indicated his/her understanding and acceptance.     Dental advisory given  Plan Discussed with: CRNA  Anesthesia Plan Comments:        Anesthesia Quick Evaluation

## 2023-02-05 NOTE — Op Note (Signed)
Procedure: 1.  Cystoscopy with left retrograde pyelogram interpretation. 2.  Left ureteroscopy with holmium laser application, stone extraction and placement of left double-J stent. 3.  Application of fluoroscopy.  Preop diagnosis: Left ureteral and renal stones.  Postop diagnosis: Same.  Surgeon: Dr. Bjorn Pippin.  Anesthesia: General.  Drain: 6 French by 24 cm left contour double-J stent with tether.  Specimen: Stone fragments.  EBL: None.  Complications: None.  Indications: The patient is a 57 year old male with a history of stones who has a 5 mm left mid ureteral stone that could not be visualized for lithotripsy he also has small renal stones.  He is elected undergo ureteroscopy for his persistent symptoms.  Procedure: He was taken the operating room was given Ancef.  A general anesthetic was moved.  He was placed in lithotomy position and fitted with PAS hose.  His perineum and genitalia were prepped with Betadine solution was draped in usual sterile fashion.  Cystoscopy was performed using a 21 [place] scope and 30 degree lens at.  Examination demonstrated a normal urethra.  The external sphincter was intact.  The prostatic urethra was short with a slightly high bladder neck but no significant obstruction.  Examination of bladder revealed mild trabeculation without tumors, stones or inflammation.  Ureteral orifices were unremarkable.  The left ureteral orifice was cannulated with 5 [place] open-ended catheter and Omnipaque was instilled.  Left retrograde pyelogram revealed a normal caliber ureter up to a filling defect in the mid ureter consistent with a stone.  Proximal to that there was some mild dilation and slight tortuosity.  After completion retrograde pyelogram a sensor wire was advanced the kidney by the stone without difficulty and appeared the stone retropulsed into the kidney.  The inner core of a 11/13 x 36 French digital access sheath was then passed over the wire  after the cystoscope was removed.  It easily advanced into the proximal ureter.  This was followed by the assembled sheath.  The inner core and wire were then removed.  A dual-lumen digital flexible scope was then advanced the kidney under visual guidance and the stone from the ureter was noted in the renal pelvis.  It was then fragmented using the 242 m holmium laser fiber on the dusting setting of the 0.3 J and 60 Hz.  I then inspected the calyces and found additional midpole calyceal stones that were fragmented.  The stone fragments were then all removed using an engage basket.  Once all fragments had been removed, the ureteroscope was removed over a wire along with the sheath.  The cystoscope was reinserted over the wire and a 6 [place] by 24 cm contour double-J stent with tether was then advanced the kidney under fluoroscopic guidance.  The wire was removed, a good coil in the kidney and a good coil in the bladder.  The bladder was drained and the cystoscope was removed leaving the stent string exiting urethra.  The string was secured to the patient's penis.  He was taken down from lithotomy position, his anesthetic was reversed and he was moved recovery in stable condition.  There were no complications.

## 2023-02-05 NOTE — Transfer of Care (Signed)
Immediate Anesthesia Transfer of Care Note  Patient: Kyle Griffin.  Procedure(s) Performed: CYSTOSCOPY/RETROGRADE/URETEROSCOPY (Left)  Patient Location: PACU  Anesthesia Type:General  Level of Consciousness: drowsy  Airway & Oxygen Therapy: Patient Spontanous Breathing and Patient connected to face mask oxygen  Post-op Assessment: Report given to RN and Post -op Vital signs reviewed and stable  Post vital signs: Reviewed and stable  Last Vitals:  Vitals Value Taken Time  BP 134/88 02/05/23 1840  Temp    Pulse 62 02/05/23 1841  Resp 13 02/05/23 1841  SpO2 100 % 02/05/23 1841  Vitals shown include unfiled device data.  Last Pain: There were no vitals filed for this visit.       Complications: No notable events documented.

## 2023-02-05 NOTE — Discharge Instructions (Addendum)
You may remove the stent on Thursday morning by pulling the attached string.  If you don't feel you can do that, please call the office.  Please bring the stones to the office for analysis.

## 2023-02-05 NOTE — H&P (Signed)
HPI: Left mid ureteral stone   Patient presents today with 2 weeks of renal colic intermittently. Was seen in the emergency department last night and diagnosed with a 5 mm left mid ureteral stone at the level of the pelvic brim. The patient has a long history of stones. He was last seen here about 2 years ago for a right sided stone that was in the similar position that he was able to pass on his own. He denies any fevers or chills. Denies any associated voiding symptoms. He has had some mild gross hematuria.   02/01/2023: Kyle Griffin is a 57 year old man who presents today as an urgent work in. They called on-call last night with concerns of mildly elevated temperature. He reports a temperature of 99.7 last night. He is scheduled for left lithotripsy tomorrow. He was given Tylenol 1 g last night and rested well. He denies any complaints today other than left flank pain and discomfort. He denies dysuria, nausea. The patient appears very well is non-toxic appearing and without distress. his pain has been rated 6 out 10. He does have oral Dilaudid at home. Vital signs are stable   02/05/23: Kyle Griffin returns today in f/u for a 5mm left mid ureteral stone that was not visible for ESWL. He has had continued pain. He has some nausea. He has stable frequency. He has had hematuria but no fever. He has had prior ESWL x 2.     ALLERGIES: Percocet TABS    MEDICATIONS: Tamsulosin Hcl 0.4 mg capsule  Adult Aspirin Low Strength 81 MG TBDP Oral  Claritin 10 mg capsule  Crestor 5 MG Oral Tablet Oral  Dilaudid 2 mg tablet 1 tablet PO Q 6 H PRN kidney stone pain  Famotidine 20 mg tablet  Hydromorphone Hcl 2 mg tablet  Lexapro 10 mg tablet  Potassium Citrate Er 15 meq (1,620 mg) tablet, extended release  Rosuvastatin Calcium 5 mg tablet  Triamcinolone Acetonide 0.1 % ointment  Vitamin D  Vitamin D3  Zolpidem Tartrate 10 mg tablet     GU PSH: ESWL, Right - 2020, 2019, 2019       PSH Notes: ESWL (2019,2020), cyst  removal on heel of foot   NON-GU PSH: Ankle Arthroscopy/surgery Dental Surgery Procedure - 2011 Elbow Arthroscopy Rotator cuff surgery Shoulder Surgery (Unspecified) Visit Complexity (formerly GPC1X) - 02/01/2023, 01/30/2023     GU PMH: Renal and ureteral calculus - 02/01/2023, - 01/30/2023, - 2020, - 2019 Renal calculus - 2022, - 2019, Nephrolithiasis, - 2014 Ureteral calculus - 2022, (Stable), A possible right proximal stone can't be fully ruled out at this time as well but left proximal stone visualization more definitive on today's study., - 2021, Left, - 2019, - 2019 Gross hematuria - 2021 Low back pain - 2021 Post-void dribbling - 2020 Urinary Urgency, He has mild urgency but no large distal stones and a clear urine. 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 I have discussed diet and hydration and he has potassium citrate called in to pick up. I will have him get a BMP in 2 weeks which will allow [place] to recheck his chloride which was slightly elevated and then he will return in 3 months with a litholink. -  2020 Personal history of other endocrine, nutritional and metabolic disease, History of hypercholesterolemia - 2014 Personal history of other specified conditions, History of heartburn - 2014 GERD Hypercholesterolemia    FAMILY HISTORY: 1 Daughter - Other 1 son - Other Family Health Status - Father alive at age 36 - Runs In Family Family Health Status - Mother's Age - Runs In Family Family Health Status Number - Runs In Family Heart Disease - Mother, Father Hypertension - Father, Mother Lung Cancer - Mother Myocardial Infarction - Mother, Father nephrolithiasis - Mother Pure  Hypercholesterolemia - Mother, Father Stroke Syndrome - Mother   SOCIAL HISTORY: Marital Status: Married Preferred Language: English; Race: White Current Smoking Status: Patient does not smoke anymore. Has not smoked since 04/21/1998.   Tobacco Use Assessment Completed: Used Tobacco in last 30 days? Drinks 4+ caffeinated drinks per day. 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     Notes: Tobacco use, Alcohol Use, Caffeine Use, Occupation:, Marital History - Currently Married   REVIEW OF SYSTEMS:    GU Review Male:   Patient reports frequent urination and get up at night to urinate. Patient denies hard to postpone urination, burning/ pain with urination, leakage of urine, stream starts and stops, trouble starting your stream, have to strain to urinate , erection problems, and penile pain.  Gastrointestinal (Upper):   Patient denies nausea, vomiting, and indigestion/ heartburn.  Gastrointestinal (Lower):   Patient denies diarrhea and constipation.  Constitutional:   Patient denies fever, night sweats, weight loss, and fatigue.  Skin:   Patient denies skin rash/ lesion and itching.  Eyes:   Patient denies blurred vision and double vision.  Ears/ Nose/ Throat:   Patient denies sore throat and sinus problems.  Hematologic/Lymphatic:   Patient denies swollen glands and easy bruising.  Cardiovascular:   Patient denies leg swelling and chest pains.  Respiratory:   Patient denies cough and shortness of breath.  Endocrine:   Patient denies excessive thirst.  Musculoskeletal:   Patient denies back pain and joint pain.  Neurological:   Patient denies headaches and dizziness.  Psychologic:   Patient denies depression and anxiety.   Notes: side pain, hematuria     VITAL SIGNS:      02/05/2023 01:02 PM  Weight 166 lb / 75.3 kg  Height 65 in / 165.1 cm  BP 118/80 mmHg  Heart Rate 68 /min  Temperature 98.0 F / 36.6 C  BMI 27.6 kg/m   MULTI-SYSTEM PHYSICAL EXAMINATION:     Constitutional: Well-nourished. No physical deformities. Normally developed. Good grooming.  Respiratory: Normal breath sounds. No labored breathing, no use of accessory muscles.   Cardiovascular: Regular rate and rhythm. No murmur, no gallop.      Complexity of Data:  Records Review:   Previous Patient Records  Urine Test Review:   Urinalysis  X-Ray Review: [facility] Protocol: Reviewed Films. Reviewed Report. Discussed With Patient.     PROCEDURES:          Visit Complexity - G2211 Chronic management         Urinalysis w/Scope - 81001 Dipstick Dipstick Cont'd Micro  Color: Amber Bilirubin: Neg WBC/hpf: NS (Not Seen)  Appearance: Cloudy Ketones: Neg RBC/hpf: 40 - 60/hpf  Specific Gravity: 1.015 Blood: 3+ Bacteria: NS (Not Seen)  pH: <=5.0 Protein: Neg Cystals: Amorph Urates  Glucose: Neg Urobilinogen: 0.2 Casts: NS (Not Seen)    Nitrites: Neg Trichomonas: Not Present    Leukocyte Esterase: Neg Mucous: Not Present  Epithelial Cells: 0 - 5/hpf      Yeast: NS (Not Seen)      Sperm: Not Present    Notes:      ASSESSMENT:      ICD-10 Details  1 GU:   Renal and ureteral calculus - N20.2 Left, Acute, Systemic Symptoms - He has a 5mm left mid ureteral stone and a 4-53mm left renal stone along with smaller bilateral renal stones. His stone wasn't visible for ESWL being over the pelvis. He remains symptomatic and would like to proceed with ureteroscopy.   I have reviewed the risks of ureteroscopy including bleeding, infection, ureteral injury, need for a stent or secondary procedures, thrombotic events and anesthetic complications.     PLAN:           Schedule Return Visit/Planned Activity: Keep Scheduled Appointment - Office Visit  Procedure: Unspecified Date - Cysto Uretero Lithotripsy - [phone]          Document Letter(s):  Created for Patient: Clinical Summary         Notes:   CC: Dr. Trinna Post Plotnikov.

## 2023-02-05 NOTE — Anesthesia Procedure Notes (Signed)
Procedure Name: LMA Insertion Date/Time: 02/05/2023 5:56 PM  Performed by: Jamelle Rushing, CRNAPre-anesthesia Checklist: Patient identified, Emergency Drugs available, Suction available, Patient being monitored and Timeout performed Patient Re-evaluated:Patient Re-evaluated prior to induction Oxygen Delivery Method: Circle system utilized Preoxygenation: Pre-oxygenation with 100% oxygen Induction Type: IV induction Ventilation: Mask ventilation without difficulty LMA: LMA inserted LMA Size: 4.0 Number of attempts: 1 Tube secured with: Tape Dental Injury: Teeth and Oropharynx as per pre-operative assessment

## 2023-02-06 ENCOUNTER — Encounter (HOSPITAL_COMMUNITY): Payer: Self-pay | Admitting: Urology

## 2023-02-06 NOTE — Anesthesia Postprocedure Evaluation (Signed)
Anesthesia Post Note  Patient: Kyle Griffin.  Procedure(s) Performed: CYSTOSCOPY/RETROGRADE/URETEROSCOPY (Left)     Patient location during evaluation: PACU Anesthesia Type: General Level of consciousness: awake and alert Pain management: pain level controlled Vital Signs Assessment: post-procedure vital signs reviewed and stable Respiratory status: spontaneous breathing, nonlabored ventilation, respiratory function stable and patient connected to nasal cannula oxygen Cardiovascular status: blood pressure returned to baseline and stable Postop Assessment: no apparent nausea or vomiting Anesthetic complications: no   No notable events documented.  Last Vitals:  Vitals:   02/05/23 1945 02/05/23 2015  BP: (!) 148/91 (!) 158/94  Pulse: 63 64  Resp: 11 12  Temp:  36.4 C  SpO2: 94% 95%    Last Pain:  Vitals:   02/05/23 2015  PainSc: 4                  Shelton Silvas

## 2023-02-16 DIAGNOSIS — N202 Calculus of kidney with calculus of ureter: Secondary | ICD-10-CM | POA: Diagnosis not present

## 2023-02-27 ENCOUNTER — Other Ambulatory Visit: Payer: Self-pay | Admitting: Internal Medicine

## 2023-05-14 ENCOUNTER — Other Ambulatory Visit: Payer: Self-pay | Admitting: Internal Medicine

## 2023-05-26 ENCOUNTER — Other Ambulatory Visit: Payer: Self-pay | Admitting: Internal Medicine

## 2023-07-19 ENCOUNTER — Other Ambulatory Visit: Payer: Self-pay | Admitting: Internal Medicine

## 2023-10-15 ENCOUNTER — Other Ambulatory Visit: Payer: Self-pay | Admitting: Internal Medicine

## 2023-11-08 ENCOUNTER — Other Ambulatory Visit: Payer: Self-pay | Admitting: Internal Medicine

## 2023-11-12 ENCOUNTER — Other Ambulatory Visit: Payer: Self-pay | Admitting: Internal Medicine

## 2023-11-14 ENCOUNTER — Other Ambulatory Visit: Payer: Self-pay | Admitting: Internal Medicine

## 2023-11-18 ENCOUNTER — Other Ambulatory Visit: Payer: Self-pay | Admitting: Internal Medicine

## 2023-11-26 ENCOUNTER — Encounter: Payer: Self-pay | Admitting: Internal Medicine

## 2023-11-26 ENCOUNTER — Ambulatory Visit (INDEPENDENT_AMBULATORY_CARE_PROVIDER_SITE_OTHER): Admitting: Internal Medicine

## 2023-11-26 VITALS — BP 124/80 | HR 96 | Temp 98.3°F | Ht 65.0 in | Wt 172.0 lb

## 2023-11-26 DIAGNOSIS — R739 Hyperglycemia, unspecified: Secondary | ICD-10-CM

## 2023-11-26 DIAGNOSIS — Z1211 Encounter for screening for malignant neoplasm of colon: Secondary | ICD-10-CM

## 2023-11-26 DIAGNOSIS — Z0000 Encounter for general adult medical examination without abnormal findings: Secondary | ICD-10-CM | POA: Diagnosis not present

## 2023-11-26 DIAGNOSIS — R10A2 Flank pain, left side: Secondary | ICD-10-CM

## 2023-11-26 DIAGNOSIS — I2583 Coronary atherosclerosis due to lipid rich plaque: Secondary | ICD-10-CM

## 2023-11-26 DIAGNOSIS — N200 Calculus of kidney: Secondary | ICD-10-CM

## 2023-11-26 DIAGNOSIS — E785 Hyperlipidemia, unspecified: Secondary | ICD-10-CM

## 2023-11-26 DIAGNOSIS — I2510 Atherosclerotic heart disease of native coronary artery without angina pectoris: Secondary | ICD-10-CM | POA: Insufficient documentation

## 2023-11-26 MED ORDER — ESCITALOPRAM OXALATE 10 MG PO TABS: 10.0000 mg | ORAL_TABLET | Freq: Every day | ORAL | 2 refills | Status: AC

## 2023-11-26 MED ORDER — HYDROMORPHONE HCL 2 MG PO TABS: 2.0000 mg | ORAL_TABLET | ORAL | 0 refills | Status: AC | PRN

## 2023-11-26 MED ORDER — ROSUVASTATIN CALCIUM 5 MG PO TABS: 5.0000 mg | ORAL_TABLET | Freq: Every day | ORAL | 2 refills | Status: AC

## 2023-11-26 MED ORDER — ZOLPIDEM TARTRATE 10 MG PO TABS: 10.0000 mg | ORAL_TABLET | Freq: Every evening | ORAL | 1 refills | Status: AC | PRN

## 2023-11-26 NOTE — Assessment & Plan Note (Signed)
 We discussed age appropriate health related issues, including available/recomended screening tests and vaccinations. We discussed a need for adhering to healthy diet and exercise. Labs were ordered to be later reviewed . All questions were answered. 2022 CT Coronary calcium  score of 68.2. Shingrix  info Cologuard (-) 2020 - ordered

## 2023-11-26 NOTE — Assessment & Plan Note (Addendum)
 On Crestor  2022 CT Coronary calcium  score of 68.2.

## 2023-11-26 NOTE — Progress Notes (Signed)
 Subjective:  Patient ID: Kyle Dais., male    DOB: [birth year]  Age: 58 y.o. MRN: [id number]  CC: Annual Exam  A well exam HPI Kyle Griffin. presents for a well exam C/o L flank pain at times off and on >1 wk   Outpatient Medications Prior to Visit  Medication Sig Dispense Refill   aspirin EC 81 MG tablet Take 81 mg by mouth daily.     Cholecalciferol (VITAMIN D3) 50 MCG (2000 UT) capsule Take 1 capsule (2,000 Units total) by mouth daily. 100 capsule 3   famotidine  (PEPCID ) 20 MG tablet Take 1 tablet (20 mg total) by mouth daily. ulcers (Patient taking differently: Take 20 mg by mouth as needed for heartburn. ulcers) 30 tablet 2   fluticasone  (FLONASE ) 50 MCG/ACT nasal spray Place 2 sprays into both nostrils daily. 16 g 6   loratadine  (CLARITIN ) 10 MG tablet Take 1 tablet (10 mg total) by mouth daily. 90 tablet 3   Potassium Citrate  15 MEQ (1620 MG) TBCR TAKE ONE TABLET BY MOUTH TWICE DAILY 180 tablet 0   tamsulosin  (FLOMAX ) 0.4 MG CAPS capsule Take 1 capsule (0.4 mg total) by mouth daily. 7 capsule 0   triamcinolone  ointment (KENALOG ) 0.1 % Apply 1 application topically 2 (two) times daily. 80 g 2   escitalopram  (LEXAPRO ) 10 MG tablet TAKE ONE TABLET BY MOUTH ONCE A DAY 30 tablet 0   HYDROmorphone  (DILAUDID ) 2 MG tablet Take 1 tablet (2 mg total) by mouth every 4 (four) hours as needed for severe pain (pain score 7-10). 10 tablet 0   rosuvastatin  (CRESTOR ) 5 MG tablet TAKE ONE TABLET BY MOUTH ONE TIME DAILY 90 tablet 1   zolpidem  (AMBIEN ) 10 MG tablet Take 1 tablet (10 mg total) by mouth at bedtime as needed for sleep. 90 tablet 1   No facility-administered medications prior to visit.    ROS: Review of Systems  Constitutional:  Negative for appetite change, fatigue and unexpected weight change.  HENT:  Negative for congestion, nosebleeds, sneezing, sore throat and trouble swallowing.   Eyes:  Negative for itching and visual disturbance.  Respiratory:  Negative  for cough.   Cardiovascular:  Negative for chest pain, palpitations and leg swelling.  Gastrointestinal:  Negative for abdominal distention, blood in stool, diarrhea and nausea.  Genitourinary:  Negative for frequency and hematuria.  Musculoskeletal:  Negative for back pain, gait problem, joint swelling and neck pain.  Skin:  Negative for rash.  Neurological:  Negative for dizziness, tremors, speech difficulty and weakness.  Psychiatric/Behavioral:  Negative for agitation, dysphoric mood and sleep disturbance. The patient is not nervous/anxious.     Objective:  BP 124/80 (BP Location: Left Arm, Patient Position: Sitting, Cuff Size: Normal)   Pulse 96   Temp 98.3 F (36.8 C) (Oral)   Ht 5' 5 (1.651 m)   Wt 172 lb (78 kg)   BMI 28.62 kg/m   BP Readings from Last 3 Encounters:  11/26/23 124/80  02/05/23 (!) 158/94  02/02/23 109/85    Wt Readings from Last 3 Encounters:  11/26/23 172 lb (78 kg)  02/05/23 165 lb (74.8 kg)  02/02/23 165 lb (74.8 kg)    Physical Exam Constitutional:      General: He is not in acute distress.    Appearance: He is well-developed.     Comments: NAD  Eyes:     Conjunctiva/sclera: Conjunctivae normal.     Pupils: Pupils are equal, round, and reactive to light.  Neck:     Thyroid : No thyromegaly.     Vascular: No JVD.  Cardiovascular:     Rate and Rhythm: Normal rate and regular rhythm.     Heart sounds: Normal heart sounds. No murmur heard.    No friction rub. No gallop.  Pulmonary:     Effort: Pulmonary effort is normal. No respiratory distress.     Breath sounds: Normal breath sounds. No wheezing or rales.  Chest:     Chest wall: No tenderness.  Abdominal:     General: Bowel sounds are normal. There is no distension.     Palpations: Abdomen is soft. There is no mass.     Tenderness: There is no abdominal tenderness. There is no guarding or rebound.  Musculoskeletal:        General: No tenderness. Normal range of motion.     Cervical  back: Normal range of motion.  Lymphadenopathy:     Cervical: No cervical adenopathy.  Skin:    General: Skin is warm and dry.     Findings: No rash.  Neurological:     Mental Status: He is alert and oriented to person, place, and time.     Cranial Nerves: No cranial nerve deficit.     Motor: No abnormal muscle tone.     Coordination: Coordination normal.     Gait: Gait normal.     Deep Tendon Reflexes: Reflexes are normal and symmetric.  Psychiatric:        Behavior: Behavior normal.        Thought Content: Thought content normal.        Judgment: Judgment normal.   Rectal - deferred  Lab Results  Component Value Date   WBC 8.8 01/30/2023   HGB 13.6 01/30/2023   HCT 39.5 01/30/2023   PLT 155 01/30/2023   GLUCOSE 113 (H) 01/30/2023   CHOL 191 01/05/2023   TRIG 107.0 01/05/2023   HDL 43.40 01/05/2023   LDLDIRECT 136.0 04/09/2019   LDLCALC 127 (H) 01/05/2023   ALT 14 01/30/2023   AST 15 01/30/2023   NA 139 01/30/2023   K 3.9 01/30/2023   CL 105 01/30/2023   CREATININE 1.97 (H) 01/30/2023   BUN 27 (H) 01/30/2023   CO2 25 01/30/2023   TSH 4.04 01/05/2023   PSA 0.56 01/05/2023   HGBA1C 5.8 01/05/2023    DG C-Arm 1-60 Min-No Report Result Date: 02/05/2023 Fluoroscopy was utilized by the requesting physician.  No radiographic interpretation.    Assessment & Plan:   Problem List Items Addressed This Visit     Coronary atherosclerosis   2022 CT Coronary calcium  score of 68.2. On Crestor , ASA      Relevant Medications   rosuvastatin  (CRESTOR ) 5 MG tablet   Dyslipidemia   On Crestor  2022 CT Coronary calcium  score of 68.2.      Relevant Medications   rosuvastatin  (CRESTOR ) 5 MG tablet   Hyperglycemia   Relevant Orders   Hemoglobin A1c   Nephrolithiasis   Probable L renal colic Check renal US , UA Rx - Dilaudid , Flomax , Toradol       Relevant Medications   HYDROmorphone  (DILAUDID ) 2 MG tablet   Well adult exam - Primary   We discussed age appropriate  health related issues, including available/recomended screening tests and vaccinations. We discussed a need for adhering to healthy diet and exercise. Labs were ordered to be later reviewed . All questions were answered. 2022 CT Coronary calcium  score of 68.2. Shingrix  info Cologuard (-) 2020 -  ordered      Relevant Medications   zolpidem  (AMBIEN ) 10 MG tablet   Other Relevant Orders   TSH   Urinalysis   CBC with Differential/Platelet   Lipid panel   PSA   Comprehensive metabolic panel with GFR   Hemoglobin A1c   Other Visit Diagnoses       Screening for colon cancer       Relevant Orders   Cologuard     Flank pain, left side       Relevant Orders   US  RENAL         Meds ordered this encounter  Medications   escitalopram  (LEXAPRO ) 10 MG tablet    Sig: Take 1 tablet (10 mg total) by mouth daily.    Dispense:  90 tablet    Refill:  2    Schedule office visit   rosuvastatin  (CRESTOR ) 5 MG tablet    Sig: Take 1 tablet (5 mg total) by mouth daily.    Dispense:  90 tablet    Refill:  2   zolpidem  (AMBIEN ) 10 MG tablet    Sig: Take 1 tablet (10 mg total) by mouth at bedtime as needed for sleep.    Dispense:  90 tablet    Refill:  1   HYDROmorphone  (DILAUDID ) 2 MG tablet    Sig: Take 1 tablet (2 mg total) by mouth every 4 (four) hours as needed for severe pain (pain score 7-10).    Dispense:  10 tablet    Refill:  0      Follow-up: No follow-ups on file.  Marolyn Noel, MD

## 2023-11-26 NOTE — Assessment & Plan Note (Signed)
 2022 CT Coronary calcium  score of 68.2. On Crestor , ASA

## 2023-11-26 NOTE — Assessment & Plan Note (Signed)
 Probable L renal colic Check renal US , UA Rx - Dilaudid , Flomax , Toradol 

## 2023-12-03 ENCOUNTER — Other Ambulatory Visit (INDEPENDENT_AMBULATORY_CARE_PROVIDER_SITE_OTHER)

## 2023-12-03 DIAGNOSIS — Z0000 Encounter for general adult medical examination without abnormal findings: Secondary | ICD-10-CM | POA: Diagnosis not present

## 2023-12-03 DIAGNOSIS — R739 Hyperglycemia, unspecified: Secondary | ICD-10-CM

## 2023-12-03 LAB — URINALYSIS
Bilirubin Urine: NEGATIVE
Hgb urine dipstick: NEGATIVE
Ketones, ur: NEGATIVE
Leukocytes,Ua: NEGATIVE
Nitrite: NEGATIVE
Specific Gravity, Urine: 1.025 (ref 1.000–1.030)
Total Protein, Urine: NEGATIVE
Urine Glucose: NEGATIVE
Urobilinogen, UA: 0.2 (ref 0.0–1.0)
pH: 6 (ref 5.0–8.0)

## 2023-12-03 LAB — COMPREHENSIVE METABOLIC PANEL WITH GFR
ALT: 23 U/L (ref 0–53)
AST: 20 U/L (ref 0–37)
Albumin: 4.5 g/dL (ref 3.5–5.2)
Alkaline Phosphatase: 57 U/L (ref 39–117)
BUN: 18 mg/dL (ref 6–23)
CO2: 25 meq/L (ref 19–32)
Calcium: 9 mg/dL (ref 8.4–10.5)
Chloride: 105 meq/L (ref 96–112)
Creatinine, Ser: 1.32 mg/dL (ref 0.40–1.50)
GFR: 58.74 mL/min — ABNORMAL LOW (ref >60.00)
Glucose, Bld: 111 mg/dL — ABNORMAL HIGH (ref 70–99)
Potassium: 4.3 meq/L (ref 3.5–5.1)
Sodium: 139 meq/L (ref 135–145)
Total Bilirubin: 2.8 mg/dL — ABNORMAL HIGH (ref 0.2–1.2)
Total Protein: 6.5 g/dL (ref 6.0–8.3)

## 2023-12-03 LAB — LIPID PANEL
Cholesterol: 190 mg/dL (ref 0–200)
HDL: 43.8 mg/dL (ref >39.00)
LDL Cholesterol: 112 mg/dL — ABNORMAL HIGH (ref 0–99)
NonHDL: 146.68
Total CHOL/HDL Ratio: 4
Triglycerides: 175 mg/dL — ABNORMAL HIGH (ref 0.0–149.0)
VLDL: 35 mg/dL (ref 0.0–40.0)

## 2023-12-03 LAB — CBC WITH DIFFERENTIAL/PLATELET
Basophils Absolute: 0.1 K/uL (ref 0.0–0.1)
Basophils Relative: 1.4 % (ref 0.0–3.0)
Eosinophils Absolute: 0.5 K/uL (ref 0.0–0.7)
Eosinophils Relative: 11.9 % — ABNORMAL HIGH (ref 0.0–5.0)
HCT: 45.8 % (ref 39.0–52.0)
Hemoglobin: 15.5 g/dL (ref 13.0–17.0)
Lymphocytes Relative: 30.6 % (ref 12.0–46.0)
Lymphs Abs: 1.4 K/uL (ref 0.7–4.0)
MCHC: 33.8 g/dL (ref 30.0–36.0)
MCV: 87.3 fl (ref 78.0–100.0)
Monocytes Absolute: 0.5 K/uL (ref 0.1–1.0)
Monocytes Relative: 11.6 % (ref 3.0–12.0)
Neutro Abs: 2 K/uL (ref 1.4–7.7)
Neutrophils Relative %: 44.5 % (ref 43.0–77.0)
Platelets: 166 K/uL (ref 150.0–400.0)
RBC: 5.25 Mil/uL (ref 4.22–5.81)
RDW: 13 % (ref 11.5–15.5)
WBC: 4.5 K/uL (ref 4.0–10.5)

## 2023-12-03 LAB — HEMOGLOBIN A1C: Hgb A1c MFr Bld: 6 % (ref 4.6–6.5)

## 2023-12-03 LAB — TSH: TSH: 5.8 u[IU]/mL — ABNORMAL HIGH (ref 0.35–5.50)

## 2023-12-03 LAB — PSA: PSA: 0.5 ng/mL (ref 0.10–4.00)

## 2023-12-09 ENCOUNTER — Ambulatory Visit: Payer: Self-pay | Admitting: Internal Medicine

## 2023-12-10 DIAGNOSIS — M25561 Pain in right knee: Secondary | ICD-10-CM | POA: Insufficient documentation

## 2023-12-25 DIAGNOSIS — Z1211 Encounter for screening for malignant neoplasm of colon: Secondary | ICD-10-CM | POA: Diagnosis not present

## 2023-12-31 LAB — COLOGUARD: COLOGUARD: NEGATIVE

## 2024-02-04 DIAGNOSIS — T1502XA Foreign body in cornea, left eye, initial encounter: Secondary | ICD-10-CM | POA: Diagnosis not present

## 2024-02-05 DIAGNOSIS — T1512XD Foreign body in conjunctival sac, left eye, subsequent encounter: Secondary | ICD-10-CM | POA: Diagnosis not present

## 2024-02-11 DIAGNOSIS — T1502XS Foreign body in cornea, left eye, sequela: Secondary | ICD-10-CM | POA: Diagnosis not present

## 2024-03-18 ENCOUNTER — Ambulatory Visit: Admitting: Internal Medicine

## 2024-03-18 ENCOUNTER — Encounter: Payer: Self-pay | Admitting: Internal Medicine

## 2024-03-18 ENCOUNTER — Ambulatory Visit: Payer: Self-pay | Admitting: Internal Medicine

## 2024-03-18 VITALS — BP 122/90 | HR 85 | Temp 98.6°F | Ht 65.0 in | Wt 176.0 lb

## 2024-03-18 DIAGNOSIS — R1032 Left lower quadrant pain: Secondary | ICD-10-CM | POA: Diagnosis not present

## 2024-03-18 DIAGNOSIS — K219 Gastro-esophageal reflux disease without esophagitis: Secondary | ICD-10-CM | POA: Diagnosis not present

## 2024-03-18 DIAGNOSIS — N200 Calculus of kidney: Secondary | ICD-10-CM | POA: Diagnosis not present

## 2024-03-18 DIAGNOSIS — I2583 Coronary atherosclerosis due to lipid rich plaque: Secondary | ICD-10-CM

## 2024-03-18 LAB — URINALYSIS
Bilirubin Urine: NEGATIVE
Ketones, ur: NEGATIVE
Leukocytes,Ua: NEGATIVE
Nitrite: NEGATIVE
Specific Gravity, Urine: >=1.03 — AB (ref 1.000–1.030)
Total Protein, Urine: NEGATIVE
Urine Glucose: NEGATIVE
Urobilinogen, UA: 0.2 (ref 0.0–1.0)
pH: 5.5 (ref 5.0–8.0)

## 2024-03-18 LAB — COMPREHENSIVE METABOLIC PANEL WITH GFR
ALT: 19 U/L (ref 3–53)
AST: 19 U/L (ref 5–37)
Albumin: 4.5 g/dL (ref 3.5–5.2)
Alkaline Phosphatase: 60 U/L (ref 39–117)
BUN: 20 mg/dL (ref 6–23)
CO2: 26 meq/L (ref 19–32)
Calcium: 9.7 mg/dL (ref 8.4–10.5)
Chloride: 105 meq/L (ref 96–112)
Creatinine, Ser: 1.33 mg/dL (ref 0.40–1.50)
GFR: 58.1 mL/min — ABNORMAL LOW
Glucose, Bld: 80 mg/dL (ref 70–99)
Potassium: 3.8 meq/L (ref 3.5–5.1)
Sodium: 139 meq/L (ref 135–145)
Total Bilirubin: 2.7 mg/dL — ABNORMAL HIGH (ref 0.2–1.2)
Total Protein: 7.2 g/dL (ref 6.0–8.3)

## 2024-03-18 LAB — CBC WITH DIFFERENTIAL/PLATELET
Basophils Absolute: 0.1 10*3/uL (ref 0.0–0.1)
Basophils Relative: 1 % (ref 0.0–3.0)
Eosinophils Absolute: 0.4 10*3/uL (ref 0.0–0.7)
Eosinophils Relative: 6.7 % — ABNORMAL HIGH (ref 0.0–5.0)
HCT: 43.8 % (ref 39.0–52.0)
Hemoglobin: 15 g/dL (ref 13.0–17.0)
Lymphocytes Relative: 24.6 % (ref 12.0–46.0)
Lymphs Abs: 1.5 10*3/uL (ref 0.7–4.0)
MCHC: 34.3 g/dL (ref 30.0–36.0)
MCV: 86.9 fl (ref 78.0–100.0)
Monocytes Absolute: 0.7 10*3/uL (ref 0.1–1.0)
Monocytes Relative: 11.7 % (ref 3.0–12.0)
Neutro Abs: 3.4 10*3/uL (ref 1.4–7.7)
Neutrophils Relative %: 56 % (ref 43.0–77.0)
Platelets: 179 10*3/uL (ref 150.0–400.0)
RBC: 5.04 Mil/uL (ref 4.22–5.81)
RDW: 13.5 % (ref 11.5–15.5)
WBC: 6 10*3/uL (ref 4.0–10.5)

## 2024-03-18 LAB — SEDIMENTATION RATE: Sed Rate: 5 mm/h (ref 0–20)

## 2024-03-18 MED ORDER — AMOXICILLIN-POT CLAVULANATE 875-125 MG PO TABS: 1.0000 | ORAL_TABLET | Freq: Two times a day (BID) | ORAL | 0 refills | Status: AC

## 2024-03-18 MED ORDER — PANTOPRAZOLE SODIUM 40 MG PO TBEC: 40.0000 mg | DELAYED_RELEASE_TABLET | Freq: Every day | ORAL | 11 refills | Status: AC

## 2024-03-18 NOTE — Assessment & Plan Note (Signed)
 2022 CT Coronary calcium  score of 68.2. On Crestor , ASA

## 2024-03-18 NOTE — Assessment & Plan Note (Signed)
 Start Protonix  and Augmentin  for possible diverticulitis vs other Will get labs and abd CT

## 2024-03-18 NOTE — Assessment & Plan Note (Signed)
 Pain is different now

## 2024-03-18 NOTE — Assessment & Plan Note (Signed)
LLQ abd pain at times. CT abd 2017 B renal stones. Colon 2003 w/hemorrhoids. Due colonoscopy

## 2024-03-18 NOTE — Progress Notes (Signed)
 "  Subjective:  Patient ID: Kyle Krol., male    DOB: [birth year]  Age: 59 y.o. MRN: [id number]  CC: Abdominal Pain (Stomach pain in the upper left side of stomach radiating down to lower left side Started three weeks ago has been consistent for the last week)   HPI Kyle C Meske Jr. presents for LLQ and LUQ pain x 3 weeks, may ease off sometimes. No change. C/o belching, burping. Pepcid  did not help.  The pain is 5 out of 10 in intensity... The pain would wake him up from from sleep at times. Kyle Griffin states the pain is different compared to what he used to have with kidney stones/renal colic  No fever, chills, night sweats.  No nausea or vomiting.  No dysuria.  He has some BMs are regular  Outpatient Medications Prior to Visit  Medication Sig Dispense Refill   aspirin EC 81 MG tablet Take 81 mg by mouth daily.     Cholecalciferol (VITAMIN D3) 50 MCG (2000 UT) capsule Take 1 capsule (2,000 Units total) by mouth daily. 100 capsule 3   escitalopram  (LEXAPRO ) 10 MG tablet Take 1 tablet (10 mg total) by mouth daily. 90 tablet 2   famotidine  (PEPCID ) 20 MG tablet Take 1 tablet (20 mg total) by mouth daily. ulcers (Patient taking differently: Take 20 mg by mouth as needed for heartburn. ulcers) 30 tablet 2   fluticasone  (FLONASE ) 50 MCG/ACT nasal spray Place 2 sprays into both nostrils daily. 16 g 6   HYDROmorphone  (DILAUDID ) 2 MG tablet Take 1 tablet (2 mg total) by mouth every 4 (four) hours as needed for severe pain (pain score 7-10). 10 tablet 0   loratadine  (CLARITIN ) 10 MG tablet Take 1 tablet (10 mg total) by mouth daily. 90 tablet 3   Potassium Citrate  15 MEQ (1620 MG) TBCR TAKE ONE TABLET BY MOUTH TWICE DAILY 180 tablet 0   rosuvastatin  (CRESTOR ) 5 MG tablet Take 1 tablet (5 mg total) by mouth daily. 90 tablet 2   tamsulosin  (FLOMAX ) 0.4 MG CAPS capsule Take 1 capsule (0.4 mg total) by mouth daily. 7 capsule 0   triamcinolone  ointment (KENALOG ) 0.1 % Apply 1 application  topically 2 (two) times daily. 80 g 2   zolpidem  (AMBIEN ) 10 MG tablet Take 1 tablet (10 mg total) by mouth at bedtime as needed for sleep. 90 tablet 1   No facility-administered medications prior to visit.    ROS: Review of Systems  Constitutional:  Negative for appetite change, fatigue and unexpected weight change.  HENT:  Negative for congestion, nosebleeds, sneezing, sore throat and trouble swallowing.   Eyes:  Negative for itching and visual disturbance.  Respiratory:  Negative for cough.   Cardiovascular:  Negative for chest pain, palpitations and leg swelling.  Gastrointestinal:  Positive for abdominal distention and abdominal pain. Negative for blood in stool, constipation, diarrhea and nausea.  Genitourinary:  Negative for dysuria, frequency and hematuria.  Musculoskeletal:  Negative for back pain, gait problem, joint swelling and neck pain.  Skin:  Negative for rash.  Neurological:  Negative for dizziness, tremors, speech difficulty and weakness.  Psychiatric/Behavioral:  Negative for agitation, dysphoric mood and sleep disturbance. The patient is not nervous/anxious.     Objective:  BP (!) 122/90   Pulse 85   Temp 98.6 F (37 C) (Oral)   Ht 5' 5 (1.651 m)   Wt 176 lb (79.8 kg)   SpO2 95%   BMI 29.29 kg/m   BP Readings from  Last 3 Encounters:  03/18/24 (!) 122/90  11/26/23 124/80  02/05/23 (!) 158/94    Wt Readings from Last 3 Encounters:  03/18/24 176 lb (79.8 kg)  11/26/23 172 lb (78 kg)  02/05/23 165 lb (74.8 kg)    Axam: NAD, HEENT - not dry, heart w/RRR, lungs- CTA LLQ is tender; no mass, no rebound, no HSM  Lab Results  Component Value Date   WBC 4.5 12/03/2023   HGB 15.5 12/03/2023   HCT 45.8 12/03/2023   PLT 166.0 12/03/2023   GLUCOSE 111 (H) 12/03/2023   CHOL 190 12/03/2023   TRIG 175.0 (H) 12/03/2023   HDL 43.80 12/03/2023   LDLDIRECT 136.0 04/09/2019   LDLCALC 112 (H) 12/03/2023   ALT 23 12/03/2023   AST 20 12/03/2023   NA 139  12/03/2023   K 4.3 12/03/2023   CL 105 12/03/2023   CREATININE 1.32 12/03/2023   BUN 18 12/03/2023   CO2 25 12/03/2023   TSH 5.80 (H) 12/03/2023   PSA 0.50 12/03/2023   HGBA1C 6.0 12/03/2023       Assessment & Plan:   Problem List Items Addressed This Visit     GERD   Start Protonix  and Augmentin  for possible diverticulitis vs other Will get labs and abd CT      Relevant Medications   pantoprazole  (PROTONIX ) 40 MG tablet   Nephrolithiasis   Pain is different now      LLQ abdominal pain - Primary   LLQ abd pain at times. CT abd 2017 B renal stones. Colon 2003 w/hemorrhoids. Due colonoscopy      Relevant Orders   Comprehensive metabolic panel with GFR   CBC with Differential/Platelet   Sedimentation rate   Urinalysis   CT ABDOMEN PELVIS W CONTRAST   Coronary atherosclerosis   2022 CT Coronary calcium  score of 68.2. On Crestor , ASA         Meds ordered this encounter  Medications   amoxicillin -clavulanate (AUGMENTIN ) 875-125 MG tablet    Sig: Take 1 tablet by mouth 2 (two) times daily.    Dispense:  20 tablet    Refill:  0   pantoprazole  (PROTONIX ) 40 MG tablet    Sig: Take 1 tablet (40 mg total) by mouth daily.    Dispense:  30 tablet    Refill:  11      Follow-up: Return in about 2 weeks (around 04/01/2024) for a follow-up visit.  Marolyn Noel, MD "

## 2024-03-19 ENCOUNTER — Ambulatory Visit: Admitting: Internal Medicine

## 2024-03-19 ENCOUNTER — Ambulatory Visit
Admission: RE | Admit: 2024-03-19 | Discharge: 2024-03-19 | Disposition: A | Source: Ambulatory Visit | Attending: Internal Medicine | Admitting: Internal Medicine

## 2024-03-19 DIAGNOSIS — R1032 Left lower quadrant pain: Secondary | ICD-10-CM

## 2024-03-19 MED ORDER — IOPAMIDOL (ISOVUE-300) INJECTION 61%
100.0000 mL | Freq: Once | INTRAVENOUS | Status: AC | PRN
  Administered 2024-03-19: 100 mL via INTRAVENOUS

## 2024-03-20 ENCOUNTER — Telehealth: Payer: Self-pay

## 2024-03-20 NOTE — Telephone Encounter (Signed)
 Pts wife is not on DPR on file in pts chart. Pt needs to come call into the office and get results when he can as unfortunately we can not discuss any of the pts information at this time. Pt can stop by the office and add wife to [facility].

## 2024-03-20 NOTE — Telephone Encounter (Signed)
 Copied from CRM [id number]. Topic: Clinical - Lab/Test Results >> Mar 20, 2024 10:55 AM Carlyon D wrote: Reason for CRM: Pt wife Kyle Griffin is calling in regards to pt CT imaging results. Miss landry is asking if she can get the call back as pt is at work. Please call wife Kyle Griffin back with results.  Wife Kyle Griffin # [phone].

## 2024-03-21 ENCOUNTER — Ambulatory Visit: Admitting: Internal Medicine
